# Patient Record
Sex: Female | Born: 1980 | Race: Black or African American | Hispanic: No | Marital: Single | Smoking: Current every day smoker
Health system: Southern US, Community
[De-identification: ages and names within clinical notes are randomized; demographics above are authoritative.]

## PROBLEM LIST (undated history)

## (undated) DIAGNOSIS — F1910 Other psychoactive substance abuse, uncomplicated: Secondary | ICD-10-CM

## (undated) DIAGNOSIS — F1920 Other psychoactive substance dependence, uncomplicated: Secondary | ICD-10-CM

## (undated) DIAGNOSIS — F32A Depression, unspecified: Secondary | ICD-10-CM

## (undated) DIAGNOSIS — F419 Anxiety disorder, unspecified: Secondary | ICD-10-CM

## (undated) DIAGNOSIS — F329 Major depressive disorder, single episode, unspecified: Secondary | ICD-10-CM

## (undated) DIAGNOSIS — F4310 Post-traumatic stress disorder, unspecified: Secondary | ICD-10-CM

## (undated) DIAGNOSIS — T7422XA Child sexual abuse, confirmed, initial encounter: Secondary | ICD-10-CM

## (undated) DIAGNOSIS — G4700 Insomnia, unspecified: Secondary | ICD-10-CM

## (undated) DIAGNOSIS — D259 Leiomyoma of uterus, unspecified: Secondary | ICD-10-CM

## (undated) DIAGNOSIS — T50902A Poisoning by unspecified drugs, medicaments and biological substances, intentional self-harm, initial encounter: Secondary | ICD-10-CM

## (undated) DIAGNOSIS — R45851 Suicidal ideations: Secondary | ICD-10-CM

## (undated) HISTORY — DX: Anxiety disorder, unspecified: F41.9

## (undated) HISTORY — PX: NO PAST SURGERIES: SHX2092

## (undated) HISTORY — DX: Depression, unspecified: F32.A

## (undated) HISTORY — DX: Other psychoactive substance abuse, uncomplicated: F19.10

## (undated) HISTORY — DX: Post-traumatic stress disorder, unspecified: F43.10

## (undated) HISTORY — DX: Leiomyoma of uterus, unspecified: D25.9

## (undated) HISTORY — DX: Major depressive disorder, single episode, unspecified: F32.9

---

## 2008-08-09 ENCOUNTER — Emergency Department (HOSPITAL_COMMUNITY): Admission: EM | Admit: 2008-08-09 | Discharge: 2008-08-09 | Payer: Self-pay | Admitting: Emergency Medicine

## 2012-06-05 ENCOUNTER — Encounter: Payer: Self-pay | Admitting: Internal Medicine

## 2012-09-22 ENCOUNTER — Telehealth: Payer: Self-pay | Admitting: Obstetrics and Gynecology

## 2012-10-01 ENCOUNTER — Encounter: Payer: Self-pay | Admitting: Obstetrics and Gynecology

## 2014-03-17 ENCOUNTER — Other Ambulatory Visit (HOSPITAL_COMMUNITY): Payer: Self-pay | Admitting: Obstetrics and Gynecology

## 2014-03-18 ENCOUNTER — Other Ambulatory Visit (HOSPITAL_COMMUNITY): Payer: Self-pay | Admitting: Obstetrics and Gynecology

## 2014-03-18 DIAGNOSIS — N979 Female infertility, unspecified: Secondary | ICD-10-CM

## 2014-03-23 ENCOUNTER — Ambulatory Visit (HOSPITAL_COMMUNITY)
Admission: RE | Admit: 2014-03-23 | Discharge: 2014-03-23 | Disposition: A | Discharge status: Home / Self Care | Payer: 59 | Source: Ambulatory Visit | Attending: Obstetrics and Gynecology | Admitting: Obstetrics and Gynecology

## 2014-03-23 DIAGNOSIS — N979 Female infertility, unspecified: Secondary | ICD-10-CM | POA: Insufficient documentation

## 2014-03-23 MED ORDER — IOHEXOL 300 MG/ML  SOLN
20.0000 mL | Freq: Once | INTRAMUSCULAR | Status: AC | PRN
  Administered 2014-03-23: 5 mL

## 2014-12-16 ENCOUNTER — Encounter (HOSPITAL_COMMUNITY): Payer: Self-pay | Admitting: Psychology

## 2014-12-17 ENCOUNTER — Other Ambulatory Visit (HOSPITAL_COMMUNITY): Payer: 59 | Admitting: Psychology

## 2014-12-17 ENCOUNTER — Encounter (HOSPITAL_COMMUNITY): Payer: Self-pay | Admitting: Medical

## 2014-12-17 DIAGNOSIS — F1221 Cannabis dependence, in remission: Secondary | ICD-10-CM | POA: Insufficient documentation

## 2014-12-17 DIAGNOSIS — F17200 Nicotine dependence, unspecified, uncomplicated: Secondary | ICD-10-CM | POA: Insufficient documentation

## 2014-12-17 DIAGNOSIS — F1220 Cannabis dependence, uncomplicated: Secondary | ICD-10-CM | POA: Insufficient documentation

## 2014-12-17 DIAGNOSIS — Z62810 Personal history of physical and sexual abuse in childhood: Secondary | ICD-10-CM | POA: Insufficient documentation

## 2014-12-17 DIAGNOSIS — Z811 Family history of alcohol abuse and dependence: Secondary | ICD-10-CM | POA: Insufficient documentation

## 2014-12-17 DIAGNOSIS — F4310 Post-traumatic stress disorder, unspecified: Secondary | ICD-10-CM | POA: Insufficient documentation

## 2014-12-17 DIAGNOSIS — F1620 Hallucinogen dependence, uncomplicated: Secondary | ICD-10-CM | POA: Insufficient documentation

## 2014-12-17 DIAGNOSIS — F32A Depression, unspecified: Secondary | ICD-10-CM | POA: Insufficient documentation

## 2014-12-17 DIAGNOSIS — T7422XA Child sexual abuse, confirmed, initial encounter: Secondary | ICD-10-CM | POA: Insufficient documentation

## 2014-12-17 DIAGNOSIS — F1020 Alcohol dependence, uncomplicated: Secondary | ICD-10-CM | POA: Insufficient documentation

## 2014-12-17 DIAGNOSIS — F17210 Nicotine dependence, cigarettes, uncomplicated: Secondary | ICD-10-CM | POA: Insufficient documentation

## 2014-12-17 DIAGNOSIS — F329 Major depressive disorder, single episode, unspecified: Secondary | ICD-10-CM | POA: Insufficient documentation

## 2014-12-17 DIAGNOSIS — F4312 Post-traumatic stress disorder, chronic: Secondary | ICD-10-CM | POA: Insufficient documentation

## 2014-12-17 MED ORDER — TRAZODONE HCL 50 MG PO TABS: 50.0000 mg | ORAL_TABLET | Freq: Every evening | ORAL | Status: DC | PRN

## 2014-12-17 MED ORDER — HYDROXYZINE PAMOATE 25 MG PO CAPS: 25.0000 mg | ORAL_CAPSULE | Freq: Three times a day (TID) | ORAL | Status: DC | PRN

## 2014-12-17 MED ORDER — ESCITALOPRAM OXALATE 20 MG PO TABS: 20.0000 mg | ORAL_TABLET | Freq: Every day | ORAL | Status: DC
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
## 2014-12-18 ENCOUNTER — Encounter (HOSPITAL_COMMUNITY): Payer: Self-pay | Admitting: Medical
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
## 2014-12-20 ENCOUNTER — Other Ambulatory Visit (HOSPITAL_COMMUNITY): Payer: 59 | Attending: Psychiatry | Admitting: Licensed Clinical Social Worker

## 2014-12-20 ENCOUNTER — Encounter (HOSPITAL_COMMUNITY): Payer: Self-pay | Admitting: Psychology

## 2014-12-20 DIAGNOSIS — F4310 Post-traumatic stress disorder, unspecified: Secondary | ICD-10-CM | POA: Diagnosis not present

## 2014-12-20 DIAGNOSIS — F1020 Alcohol dependence, uncomplicated: Secondary | ICD-10-CM | POA: Diagnosis present

## 2014-12-20 DIAGNOSIS — F17210 Nicotine dependence, cigarettes, uncomplicated: Secondary | ICD-10-CM | POA: Diagnosis not present

## 2014-12-20 DIAGNOSIS — F1220 Cannabis dependence, uncomplicated: Secondary | ICD-10-CM | POA: Diagnosis not present

## 2014-12-20 DIAGNOSIS — Z62810 Personal history of physical and sexual abuse in childhood: Secondary | ICD-10-CM | POA: Diagnosis not present

## 2014-12-20 NOTE — Addendum Note (Signed)
Addended byAmalia Hailey, Damico Partin on: 12/20/2014 09:58 AM   Modules accepted: Medications
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
## 2014-12-21 ENCOUNTER — Encounter (HOSPITAL_COMMUNITY): Payer: Self-pay | Admitting: Psychology

## 2014-12-21 ENCOUNTER — Encounter (HOSPITAL_COMMUNITY): Payer: Self-pay | Admitting: Licensed Clinical Social Worker

## 2014-12-21 NOTE — Progress Notes (Signed)
    Daily Group Progress Note  Program: CD-IOP   Group Time: 1-2:30  Participation Level: Active  Behavioral Response: Appropriate and Sharing  Type of Therapy: Process Group  Topic: After checking in, group members took turns sharing about recovery-related activities and challenges they faced since the last group on Friday.  Group members were active during the discussion and many group members engaged in giving feedback to other group members. Two new group members joined the group today and introduced themselves to the group. Drug tests were taken today.     Group Time: 2:45-4  Participation Level: Active  Behavioral Response: Appropriate and Sharing  Type of Therapy: Psycho-education Group  Topic: The second part of group focused on Part 2 Phases of Recovery. A continuation of Friday's group entailed each group member identifying issues in recovery they currently deal with or have in the past. Group members were engaged in discussion of the Ambivalence Phase of Recovery.     Summary: She participated openly in her first group. Her sobriety date is 4/24. [place]:  This is the patient's second group. She reported the weekend was difficult for her. She didn't sleep well and wondered whether the Trazadone was working. Other group members gave her information about their experience with Trazadone. The patient did not go to any meetings and didn't call anyone. The patient allowed her 2 nephews to come over and spend Friday night with her, even though she was tired. It was pointed out to her that she needs to work on self-care. The patient acknowledged that she understands self-care but just doesn't do it. 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 The patient reported isolation is a trigger for her to drink. Several group members gave her feedback about the importance of going to meetings and calling people in recovery. The patient participated in the  discussion about ambivalence. She acknowledges that she wants to stop using alcohol but doubts her personal strength to accomplish this. The patient feels shame and guilt about how alcohol has affected her life. The patient was open to suggestions and participated in the discussion on the phases of recovery. Her sobriety date remains 4/22.   Family Program: Family present? No   Name of family member(s):   UDS collected: Yes Results: Pending  AA/NA attended?: No{ Sponsor?: No   Lorry Anastasi S, Licensed Cli

## 2014-12-22 ENCOUNTER — Other Ambulatory Visit (HOSPITAL_COMMUNITY): Payer: 59 | Admitting: Psychology
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
## 2014-12-23 ENCOUNTER — Encounter (HOSPITAL_COMMUNITY): Payer: Self-pay | Admitting: Psychology

## 2014-12-23 NOTE — Progress Notes (Signed)
    Daily Group Progress Note  Program: CD-IOP   Group Time: 1-2:30 pm  Participation Level: Active  Behavioral Response: Appropriate and Sharing  Type of Therapy: Process Group  Topic: After checking in, group members took turns sharing about any recovery related activities they had engaged in since the last group meeting, as well as any challenges they faced.  Anxiety reduction strategies were also discussed and group members provided each other with feedback throughout the session. Drug tests results were returned today and 2 were collected from the newest members.  Group Time: 2:45- 4pm  Participation Level: Active  Behavioral Response: Appropriate  Type of Therapy: Psycho-education Group  Topic: "Self-Care"; the second part of group was spent in a psycho-ed on Self Care.  A PowerPoint presentation was provided followed up by handouts. The topics included diet and sleep. Group members learned about the importance of good nutrition and shared about some of their own habits and patterns, many of which were unhealthy and discouraged. They also discussed strategies for eating in more healthful and consistent ways. Exercise and sleep hygiene strategies were also discussed.  Summary: The patient stated that she walked her dog on Monday and attended her first [place] meeting on Tuesday morning. She had planned on meeting a member of this program at the meeting and it really helped her knowing someone there.  She reported that she felt very welcomed.  The patient reported she also met with her pastor yesterday and they are going to begin meeting on a weekly basis.  She had a session with her counselor on Tuesday as well.   The patient stated that she has been feeling very "foggy" and forgetful and is having a lot of anxiety.  She told the group that she needs to have some time to herself and be as honest with herself as she is being with everyone else.  I encouraged her to go slowly and be as kind  and gentle to herself and she would be to close friend who was struggling with these same issues. The patient is sleeping better and discussed a conflict that she is having with her sister.  She also stated that she has not been eating and has "no appetite".  She received good feedback and agreed that with the information obtained in the slide show, she will address her eating and making herself have a little something even if she doesn't feel hungry. The patient is doing well in her very early recovery and responded well to this intervention. Her sobriety date remains 4/22.   Family Program: Family present? No   Name of family member(s):   UDS collected: No Results:  AA/NA attended?: [place]  Sponsor?: No, but she is very new to recovery   Makella Buckingham, LCAS

## 2014-12-24 ENCOUNTER — Other Ambulatory Visit (HOSPITAL_COMMUNITY): Payer: 59 | Admitting: Psychology

## 2014-12-24 DIAGNOSIS — F1020 Alcohol dependence, uncomplicated: Secondary | ICD-10-CM | POA: Diagnosis not present

## 2014-12-27 ENCOUNTER — Encounter (HOSPITAL_COMMUNITY): Payer: Self-pay | Admitting: Psychology

## 2014-12-27 ENCOUNTER — Other Ambulatory Visit (HOSPITAL_COMMUNITY): Payer: 59 | Attending: Psychiatry | Admitting: Licensed Clinical Social Worker

## 2014-12-27 DIAGNOSIS — F4310 Post-traumatic stress disorder, unspecified: Secondary | ICD-10-CM | POA: Diagnosis not present

## 2014-12-27 DIAGNOSIS — F1221 Cannabis dependence, in remission: Secondary | ICD-10-CM | POA: Diagnosis not present

## 2014-12-27 DIAGNOSIS — F1020 Alcohol dependence, uncomplicated: Secondary | ICD-10-CM | POA: Diagnosis present

## 2014-12-27 NOTE — Progress Notes (Signed)
    Daily Group Progress Note  Program: CD-IOP   Group Time: 1-2:30 pm  Participation Level: Active  Behavioral Response: Appropriate and Sharing  Type of Therapy: Process Group  Topic: Process; the first half of group was spent in process. Members shared about the recovery-related activities they had engaged in since the last group session. Also discussed where any challenges or issues that may have threatened their recovery. During group today, the Market researcher met with group members, including 3 for their initial assessment, and a member who was requesting a med check. Two drug tests, both negative, were returned to 2 new group members.   Group Time: 2:45- 4pm  Participation Level: Active  Behavioral Response: Appropriate and Sharing  Type of Therapy: Psycho-education Group  Topic: "Families: Nurturing vs Dysfunctional". The second half of group was spent in a psycho-ed. A PowerPoint slide show was shown and members were provided with a handout listing the characteristics of a nurturing family as compared to a dysfunctional one. As the group read over the descriptions, members shared about their own families and any characteristics they could identify. The discussion was emotional and painful for some members. Most of those present today grew up in homes with addiction and/or dysfunctional family members. As requested, members had brought in pictures of themselves when they were children and everyone shared in viewing them. Near the conclusion of the session, a graduation ceremony was held for the Intern who had recently completed her studies and would be leaving the program after working with the program for the past 9 months.   Summary: The patient reported she had attended 2 AA meetings since the last group session.  She attended the Bible Study/Prayer meeting at her church on Wednesday evening and that was very validating. The patient admitted that she "feels scared" about being  in early recovery and the uneasiness of this place where she is. Other members validated her feelings and agreed that they had also felt scared. At times, they still do. She had slept much better last night and she is becoming more comfortable with the Trazadone and not experiencing that 'hung-over' feeling in the morning as she had when she began taking it. In the psycho-ed, the patient reported she felt as if her biological family displayed many of both of the characteristics listed under nurturing and dysfunctional. She wondered whether she had misperceptions of what her childhood had been like? The patient made some good comments and displays good insight as she progresses in her very early recovery. She responded well to this intervention and her sobriety date remains 4/22.   Family Program: Family present? No   Name of family member(s):   UDS collected: No Results:   AA/NA attended?: YesThursday and Friday  Sponsor?: No, but she is new to the program and recovery   Marcin Holte, LCAS

## 2014-12-29 ENCOUNTER — Other Ambulatory Visit (HOSPITAL_COMMUNITY): Payer: 59 | Admitting: Psychology

## 2014-12-29 ENCOUNTER — Encounter (HOSPITAL_COMMUNITY): Payer: Self-pay | Admitting: Licensed Clinical Social Worker
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
## 2014-12-30 ENCOUNTER — Encounter (HOSPITAL_COMMUNITY): Payer: Self-pay | Admitting: Psychology

## 2014-12-30 NOTE — Progress Notes (Signed)
    Daily Group Progress Note  Program: CD-IOP   Group Time: 1-2:30 pm  Participation Level: Active  Behavioral Response: Appropriate  Type of Therapy: Process Group  Topic: Process: The first part of group was spent in process. Group members shared about what they have done since our last group session to support their recovery. They also identified any triggers or issues that may have contributed to cravings. There was good feedback and discussion among members. Drug test results collected on Monday were returned to those members.   Group Time: 2:45- 4pm  Participation Level: Active  Behavioral Response: Appropriate and Sharing  Type of Therapy: Psycho-education Group  Topic: Psycho-Ed: Family Sculpture: the second half of group was spent in an exercise called "Family Sculpture". Group members were asked to 'sculpt' their families using their fellow group members to serve as their family members. They were asked to sculpt their families during some time when they were children. This proved to be a very emotional session and included disclosures about member's lives and childhoods that had not been revealed before. At the conclusion of this session, the facilitators checked in with group members to insure that everyone left feeling in control and stable.   Summary: The patient reported she had gone to the 8 am [place] meeting on 2 occasions since the last group. She was trying to find a meeting that would better fit the new routine she is devising. She also met with her pastor for their weekly session. The patient served as a family member in at least 2 of the sculptures done by her fellow group members. She provided good feedback about how it felt to play the person she was representing. She did not do her family sculpture today, but will sculpt her own family on Friday. The patient responded well to this intervention and is making good progress in her early recovery. Her sobriety date  remains 4/22.   Family Program: Family present? No   Name of family member(s):   UDS collected: No Results:   AA/NA attended?: YesTuesday, Wednesday  Sponsor?: No   Ayansh Feutz, LCAS

## 2014-12-31 ENCOUNTER — Other Ambulatory Visit (HOSPITAL_COMMUNITY): Payer: 59 | Admitting: Licensed Clinical Social Worker

## 2014-12-31 DIAGNOSIS — F1020 Alcohol dependence, uncomplicated: Secondary | ICD-10-CM | POA: Diagnosis not present

## 2014-12-31 DIAGNOSIS — F1620 Hallucinogen dependence, uncomplicated: Secondary | ICD-10-CM

## 2015-01-03 ENCOUNTER — Encounter (HOSPITAL_COMMUNITY): Payer: Self-pay | Admitting: Licensed Clinical Social Worker

## 2015-01-03 ENCOUNTER — Other Ambulatory Visit (HOSPITAL_COMMUNITY): Payer: 59

## 2015-01-03 NOTE — Progress Notes (Signed)
    Daily Group Progress Note  Program: CD-IOP   Group Time: 1-2:30  Participation Level: Active  Behavioral Response: Appropriate and Sharing  Type of Therapy: Process Group  Topic: After checking in, group members took turns sharing about recovery-related activities and challenges they faced since the last group on Wednesday. One group member reported she relapsed. The relapse process was mapped out on the whiteboard. Group members were openly engaged in suggestions and feedback to the group member. Three group members saw PA Harold Hedge today. One group member was absent today. The second part of group was a continuation of challenges they faced since the last group. Two group members also had "a burning desire" that they wanted to discuss. One group member reported she is sad, depressed, and angry. Another group member reported she is agitated, tired and in physical pain. She reported she wants to drink so she can sleep. Several group members gave suggestions and strategies for relapse and a discussion ensued on the effects of PAWS.  Group members showed support to both group members. All group members appeared open and honest in sharing feelings during the process.      Group Time: 2:45-4  Participation Level: Active  Behavioral Response: Appropriate and Sharing  Type of Therapy: Process Group  Topic: The second part of group was a continuation of challenges they faced since the last group. Two group members also had "a burning desire" that they wanted to discuss. One group member reported she is sad, depressed, and angry. Another group member reported she is agitated, tired and in physical pain. She reported she wants to drink so she can sleep. Several group members gave suggestions and strategies for relapse and a discussion ensued on the effects of PAWS.  Group members showed support to both group members. All group members appeared open and honest in sharing feelings during the  process.      Summary: The patient happily reported her mom has come to town. She is excited about her mom being in town especially around Mother's Day. The patient reported she went to Bible study Wednesday night and meetings Thursday and Friday mornings. As group progressed patient became tearful and reported she is sad, depressed and angry. She also reported she is not eating, is never hungry and forces herself to eat one meal a day. Many group members showed empathy and support to patient. PA Harold Hedge suggested to her that she start drinking Ensures and she gladly accepted that suggestion. The patient continued to quietly cry and reports she is not good at asking for help. Group members gave her suggestions to let her know that she cannot do this alone and must learn how to ask for help. Patient readily accepted all suggestions and feedback and actually was smiling before group ended. Her sobriety date remains 4/22.   Family Program: Family present? No   Name of family member(s):   UDS collected: No Results: Pending  AA/NA attended?: YesThursday and Friday  Sponsor?: No   MACKENZIE,LISBETH S, Licensed Cli

## 2015-01-04 ENCOUNTER — Encounter (HOSPITAL_COMMUNITY): Payer: Self-pay | Admitting: Psychology

## 2015-01-04 ENCOUNTER — Telehealth (HOSPITAL_COMMUNITY): Payer: Self-pay | Admitting: Psychology

## 2015-01-04 NOTE — Progress Notes (Signed)
Amber Craig is a 34 y.o. female patient. CD-IOP: Family Session. I met with the patient and her mother this morning as scheduled. The patient and I had scheduled this session last week after travel plans for her mother were finalized. She had come in from [place] on Friday and intended to fly out early this coming Saturday morning. I welcomed the patient and her mother and thanked her for coming in and meeting with [place]. The basic concepts behind the program were provided and the importance of other skills, including communication, forgiveness and self-care were discussed at length. When asked how she was feeling, the patient reported she feels flat or "disconnected" from herself. She admitted she has not followed through on her 'schedule' for the week days. We have talked at length about her establishing and following a daily routine from Monday through Fridays. Instead of walking, attending meetings, journaling, reading from her [place] and other recovery-related information, she admitted sitting on the couch or just sleeping. The patient suggested that perhaps she has been going through the motions and putting on a good face for so much of her life, she doesn't really know what she is feeling deep down inside. We all agreed that being as open as she can be in the group and individual sessions will be essential. She wondered if she could meet with the program director tomorrow. I explained he was not available until Friday, but I could certainly speak with him during our treatment team tomorrow morning. The patient began taking the Lexapro about 2 weeks ago so any changes are still not going to be apparent yet. The patient's mother was very supportive and validating and she agreed that developing and following a daily schedule would be very helpful. The importance of good self-care, something this young woman has really not done in her life, was discussed and she and her mother agreed to talk about what things  they can do that "feed their soul".  The session ended with the patient's mother agreeing to contact me if she has any concerns or thoughts about her daughter, while the patient agreed that she would return to group tomorrow at 1 pm. The patient has attended 7 group sessions and is attending the minimal number of 12-step meetings required. She is taking her medications, but has seen little benefit from the anti-depressant. The trazadone has proven helpful for sleep.  The session was effective in making it clear what is expected of this patient and what will most likely help her attain stability and more joy in her daily life.  Whether she intends to truly do these things remains to be seen. We will continue to follow closely in the days ahead. The patient's sobriety date remains 12/17/14.         Edric Fetterman, LCAS

## 2015-01-05 ENCOUNTER — Other Ambulatory Visit (HOSPITAL_COMMUNITY): Payer: 59 | Admitting: Psychology

## 2015-01-05 DIAGNOSIS — F1221 Cannabis dependence, in remission: Secondary | ICD-10-CM

## 2015-01-05 DIAGNOSIS — F1020 Alcohol dependence, uncomplicated: Secondary | ICD-10-CM

## 2015-01-05 DIAGNOSIS — F1620 Hallucinogen dependence, uncomplicated: Secondary | ICD-10-CM

## 2015-01-05 DIAGNOSIS — F4312 Post-traumatic stress disorder, chronic: Secondary | ICD-10-CM

## 2015-01-07 ENCOUNTER — Other Ambulatory Visit (HOSPITAL_COMMUNITY): Payer: 59 | Admitting: Psychology

## 2015-01-07 ENCOUNTER — Encounter (HOSPITAL_COMMUNITY): Payer: Self-pay | Admitting: Psychology

## 2015-01-07 NOTE — Progress Notes (Signed)
    Daily Group Progress Note  Program: CD-IOP   Group Time: 1-2:30 pm  Participation Level: Active  Behavioral Response: Appropriate and Sharing  Type of Therapy: Psycho-education Group  Topic: Psycho-Ed: the first part of group was spent in a psycho-ed with a visiting chaplain. He invited group members to share their own definitions of Spirituality. While some members easily identified their description, others struggled and were confused about spirituality versus religion. The session ended with members able to differentiate the meaning of spirituality and religion. Group members reported they had enjoyed the session and the manner of the guest. One drug test was collected during group today while the results from [facility] session were returned to others.   Group Time: 2:45- 4pm  Participation Level: Active  Behavioral Response: Sharing  Type of Therapy: Process Group  Topic: Process/Graduation: the second half of group was spent in process. Members shared about the recovery-related things they had done since the last group session. While some reported attending a number of 12-step meetings, others admitted that they had not been to any. The rules of the program, including attending at least 4 12-step meetings per week was emphasized. The session ended with a graduation ceremony. The member graduating hade invited his parents and girlfriend. Also invited were 2 former group members who had previously graduated. Kind words were shared and the session proved very touching to all present.     Summary: The patient was attentive and engaged during the visit with the chaplain. She offered her experience and belief and noted that it is comforting "knowing that there is a Higher Power". the patient agreed with the chaplain when he wondered whether this meant that she was "relinquishing control". in process, the patient reported she had spent time with her mother and had a session with her  counselor and mother. It had gone well and they were really talking about things. She also noted that she is eating more than she was previously. The patient attended an [place] meeting this morning at the [employer] and confirmed she has a Medical sales representative. She reported, "I am feeling better today". The patient shared about some upcoming plans to attend a "Burlesque Show" and wondered whether she should go or not? There will be lots of alcohol and she was questioning whether this would be too risky for her. Group members provided good feedback to her and she accepted the feedback well. She shared kind words with the graduating member and wished him well. The patient presented today as more stable and emotionally balanced. She made some good comments and responded well to this intervention. Her sobriety date remains 4/22.  Family Program: Family present? No   Name of family member(s):   UDS collected: No Results:  AA/NA attended?: YesWednesday  Sponsor?: Yes, she has has a temporary sponsor   Amber Craig, LCAS

## 2015-01-10 ENCOUNTER — Encounter (HOSPITAL_COMMUNITY): Payer: Self-pay | Admitting: Psychology

## 2015-01-10 ENCOUNTER — Other Ambulatory Visit (HOSPITAL_COMMUNITY): Payer: 59 | Admitting: Licensed Clinical Social Worker

## 2015-01-10 DIAGNOSIS — F1020 Alcohol dependence, uncomplicated: Secondary | ICD-10-CM

## 2015-01-10 NOTE — Progress Notes (Signed)
    Daily Group Progress Note  Program: CD-IOP   Group Time: 1-2:30 pm  Participation Level: Active  Behavioral Response: Appropriate and Sharing  Type of Therapy: Process Group  Topic: Process: the first part of group was spent in process. Members shared about things they had done since the last session that supported their recovery. Any obstacles, temptations or thoughts about using were also disclosed. During group today, the Investment banker, operational met with the new member for a psychiatric assessment.   Group Time: 2:45-4pm  Participation Level: Active  Behavioral Response: Sharing  Type of Therapy: Psycho-education Group  Topic: Family Sculpture/Pictures: the second half of group was spent in a psycho-ed. Those members who had not done so previously, 'Sculpted" their families. They explained the different roles and atmosphere during the sculptures and received good feedback and comments by their fellow group members. Members had brought pictures from their childhoods and group members walked around the room looking at the pictures. Members shared their memories around these pictures and the events that were occurring around them. This represented another opportunity to understand and get to know one another even better.   Summary: The patient reported she had attended 2 AA meetings since the last group session. She had met another group member at one of the meetings. She intended to go to the 5:30 Happy Hour this afternoon at the [employer] and then head to her Bible Study at 7 pm at her church. The patient shared that she had decided to go to the [place] she had talked about on Wednesday. The show was really good and although there was a lot of alcohol around, it didn't really bother her and she enjoyed the show. The patient sculpted her family and her sculpture was sad and painful and she recounted the fear that he mother and 3 siblings shared over her father. "When he went to  work", the patient explained, "everybody relaxed and played, but when he was going to be in the house, we were all terrified of what might set him off". The patient had brought a number of pictures and at least 2 of them included her family. She talked openly about the ages of herself and siblings and what was going on at the times the pictures were taken. The patient made some good comments and responded well to this intervention. Her sobriety date remains 4/22.    Family Program: Family present? No   Name of family member(s):   UDS collected: No Results:  AA/NA attended?: YesThursday and Friday  Sponsor?: Yes, a temporary sponsor   Densil Ottey, LCAS

## 2015-01-11 ENCOUNTER — Encounter (HOSPITAL_COMMUNITY): Payer: Self-pay | Admitting: Licensed Clinical Social Worker

## 2015-01-11 NOTE — Progress Notes (Signed)
Amber Craig is a 34 y.o. female patient. CD-IOP: Couples Counseling Session. I met with the patient and her S/O this afternoon at the conclusion of her CD-IOP group session. After the introductions, I briefly explained the type of treatment that the patient is receiving and some of her goals of this treatment. I invited her S/O, Amber Craig, to provide some of his observations and insights into the struggles that Amber Craig is facing. He was very open and forthcoming about his S/O and displayed good insight into some of her issues. He reminded me that they have been together for over 8 years and they know each other very well. The patient and her S/O agreed that she lets a bad event over the course of the day determine her mood for the rest of the day. While a nice or pleasant event or interaction is enjoyed, it does not hold her mood like a bad event. He also agreed that she is passive-aggressive. Amber Craig agreed with this and we discussed how she might begin to become more assertive and honest in her daily communication with Amber Craig. While he is more open about communicating his needs, Amber Craig is clearly in need of feeling confident and able to clearly articulate what she needs and how she is feeling. She had the "Feeling Wheel" handout and reported she is using those daily because she isn't sure what she is feeling. I also provided them both with the handout, "[place]". They were both encouraged to read this handout by themselves and then in the next few days, they can sit down together and discuss what they read and if they agree with the recommendations. If so, they can begin to incorporate some of the recommendations in their daily lives. Amber Craig brought up the issue of intimacy. She explained that sometimes, she doesn't want to be sexual, but feels guilty if she refuses to engage intimately and goes against her inner feelings to satisfy him. Amber Craig and I both agreed that this is her 'stuff' and they both  agreed that he makes no attempt to put her on a guilty trip. I reminded her that her body is sacred and it is hers. She is not to share or allow any thing that she doesn't w3ant to do and should be proud and feel strongly about honoring herself/her body. The patent noted that historically, with men in the past, she has not wanted to be sexual, but engaged to satisfy them. But during the actual intercourse, she was disembodied and not connected with what was going on.  We discussed how they might go about deciding whether they would be sexual and on some nights, they will agree that they will not be sexual. The couple agreed to some of these recommendations and will begin to practice them. We agreed that we would meet again within about 2 weeks and discuss any issues or progress that has occurred. I also emphasized the importance of routine and structure and encouraged Amber Craig to fill out her weekly schedule from M-F on Sunday evening. She agreed to become more engaged and structured in the week days. The session went well and Amber Craig and Amber Craig talked openly about their relationship and ways they can work on improving their communication. We will continue to follow closely in the days ahead. The patient's sobriety date remains 4/22.        Amber Craig, LCAS

## 2015-01-11 NOTE — Progress Notes (Signed)
    Daily Group Progress Note  Program: CD-IOP   Group Time: 1-2:30  Participation Level: Active  Behavioral Response: Appropriate and Sharing  Type of Therapy: Process Group  Topic: After checking in, group members took turns sharing about recovery-related activities and challenges they faced since the last group on Friday. They also shared any urges or cravings they may have experienced the weekend.  Group members were openly engaged in suggestions and feedback to a group member who was returning to the group after a relapse. Two group members were absent. Drug tests were given out and two negative drug tests were returned.  T    Group Time: 2:45-4  Participation Level: Active  Behavioral Response: Appropriate and Sharing  Type of Therapy: Psycho-education Group  Topic: The second part of group focused on cognitive distortions. A staff member from the [facility] facilitated sessions 2 of the 8 week program titled "Down the Rabbit Hole," focusing on cognitive distortions. The facilitator uses multi-media: comic strip and lyrics from a song to show examples of distortions.  Each week 2 cognitive distortions will be discussed along with coping tools to use to change thinking patterns. This week the 2 cognitive distortions discussed were: personalization and blaming. Group members were actively engaged in identifying rational thoughts that can be used to combat the distorted thought and determined how the distortion may have manifested in their own lives.     Summary:  Patient reported her mom left Saturday. It was good having her to visit and is sad she has left, but she is used to her mother not living here. Patient went to a meeting Saturday morning and then came home and napped. Sunday she went to church and spent the rest of the day with her family. She gave positive feedback to returning patient by saying: Where has our way gotten [place], maybe we should try something  else." Patient reported her cognitive distortion for blaming. She blamed her boyfriend for her use of drugs, when it was actually her that motivated him to use with her. Patient appeared open and honest in her discussion on cognitive distortions. Her sobriety date remains 4/22.   Family Program: Family present? No   Name of family member(s):   UDS collected: Yes Results: Pending  AA/NA attended?: YesMonday  Sponsor?: No   Wilhelmena Zea S, Licensed Cli

## 2015-01-12 ENCOUNTER — Encounter: Payer: Self-pay | Admitting: Psychiatry

## 2015-01-12 ENCOUNTER — Other Ambulatory Visit (HOSPITAL_COMMUNITY): Payer: 59 | Admitting: Psychology

## 2015-01-12 DIAGNOSIS — F1620 Hallucinogen dependence, uncomplicated: Secondary | ICD-10-CM

## 2015-01-12 DIAGNOSIS — F329 Major depressive disorder, single episode, unspecified: Secondary | ICD-10-CM

## 2015-01-12 DIAGNOSIS — F32A Depression, unspecified: Secondary | ICD-10-CM

## 2015-01-12 DIAGNOSIS — F1020 Alcohol dependence, uncomplicated: Secondary | ICD-10-CM | POA: Diagnosis not present

## 2015-01-12 DIAGNOSIS — F4312 Post-traumatic stress disorder, chronic: Secondary | ICD-10-CM

## 2015-01-14 ENCOUNTER — Encounter (HOSPITAL_COMMUNITY): Payer: Self-pay | Admitting: Medical

## 2015-01-14 ENCOUNTER — Other Ambulatory Visit (HOSPITAL_COMMUNITY): Payer: 59 | Admitting: Psychology

## 2015-01-14 ENCOUNTER — Other Ambulatory Visit (HOSPITAL_COMMUNITY): Payer: Self-pay | Admitting: Medical

## 2015-01-14 DIAGNOSIS — F10239 Alcohol dependence with withdrawal, unspecified: Secondary | ICD-10-CM

## 2015-01-14 DIAGNOSIS — F449 Dissociative and conversion disorder, unspecified: Secondary | ICD-10-CM | POA: Insufficient documentation

## 2015-01-14 MED ORDER — BUSPIRONE HCL 15 MG PO TABS: 15.0000 mg | ORAL_TABLET | Freq: Three times a day (TID) | ORAL | Status: DC

## 2015-01-17 ENCOUNTER — Other Ambulatory Visit (HOSPITAL_COMMUNITY): Payer: 59 | Admitting: Licensed Clinical Social Worker

## 2015-01-17 ENCOUNTER — Encounter (HOSPITAL_COMMUNITY): Payer: Self-pay | Admitting: Psychology

## 2015-01-17 DIAGNOSIS — F1020 Alcohol dependence, uncomplicated: Secondary | ICD-10-CM

## 2015-01-17 DIAGNOSIS — F1620 Hallucinogen dependence, uncomplicated: Secondary | ICD-10-CM

## 2015-01-17 NOTE — Psych (Signed)
    Daily Group Progress Note  Program: CD-IOP   Group Time: 1-2:30 pm  Participation Level: Active  Behavioral Response: Appropriate  Type of Therapy: Process Group  Topic: Group Process.  The first half of group was spent in process. Members shared about any problems or challenges to their sobriety. Members also identified what they had done since the last group to strengthen their recovery, including attending meetings, interacting with others in recovery, and self-care, such as exercise, diet or self-reflection. During the session today, the program director met with a number of group members.  Group Time: 2:45- 4pm  Participation Level: Active  Behavioral Response: Appropriate  Type of Therapy: Psycho-education Group  Topic: Forgiveness/Graduation Ceremony: the second half of group was spent in a psycho-ed on "Forgiveness". Members were provided with handouts and the session moved from resentments, discussed in the last session, to forgiveness. There were good questions about forgiveness, including the time frame and what happens if people aren't ready to forgive. A variety of ways to begin to forgive were offered and members were engaged and shared easily with each other about this struggle. As the session neared an end, a graduation ceremony was held to honor a successfully graduating member. There were kind words offered and the session and week ended with positive and validating session.   Summary: The patient reported she had attended 3- AA meetings since the last group session. She had talked to her boyfriend, but he remains somewhat distant despite denying any efforts to push away from her. The patient admitted she has lots of resentments and this realization has opened her eyes. We discussed the way she might avoid resentments going forward, but those that she has now, will need to be addressed. Eventually, forgiveness will be the answer. The patient admitted she had a hard  time forgiving herself, but another member pointed out that she must be able to forgive herself or she really can't forgive someone else. The patient continues to struggle with her past, but is doing quite well regarding compliance in this program. She responded well to this intervention and her sobriety date remains 4/22.    Family Program: Family present? No   Name of family member(s):   UDS collected: No Results:   AA/NA attended?: YesWednesday, Thursday and Friday  Sponsor?: Yes   Alexius Ellington, LCAS

## 2015-01-18 ENCOUNTER — Encounter (HOSPITAL_COMMUNITY): Payer: Self-pay | Admitting: Licensed Clinical Social Worker

## 2015-01-18 NOTE — Psych (Signed)
    Daily Group Progress Note  Program: CD-IOP   Group Time: 1-2:30  Participation Level: Active  Behavioral Response: Appropriate and Sharing  Type of Therapy: Process Group  Topic: After checking in, group members took turns sharing about recovery-related activities and challenges they faced since the last group on Friday. They also shared any urges or cravings they may have experienced over the weekend. There were 3 new members that joined the group today. Drug tests were collected from all present today.     Group Time: 2:45-4  Participation Level: Active  Behavioral Response: Appropriate and Sharing  Type of Therapy: Psycho-education Group  Topic: The second part of group focused on cognitive distortions. A staff member from the [facility] facilitated session # 3 of the 8 week program titled "Down the Rabbit Hole". The facilitator uses multi-media: podcast and segment from a sitcom to show examples of distortions.  Each week 2 cognitive distortions will be discussed along with coping tools to change thinking patterns. This week the 2 cognitive distortions discussed were: fixation on getting your way and being correct. Group members were actively engaged in identifying rational thoughts that can be used to combat the distorted thought and determined how the distortion may have manifested in their own lives. The coping tool discussed for these 2 cognitive distortions were a cost/benefit analysis. Each patient completed the worksheets on cognitive distortions.       Summary: Patient reported she went to her normal "early bird" meeting Saturday morning. She saw a formal group member Saturday and met with her for 4 hours and learned to crochet. She reports she is feeling tired and sleeping more but is feeling better, less depressed.  She is now 30days sober and received a round of applause from the group members. She is going to pick up her 30 day chip tomorrow  morning at the [employer], which she is going to make her home group. Several group members acknowledged they would come to the meeting in the morning to help her celebrate picking up her 30 day chip. The patient participated in the group discussion on cognitive distortions. She completed the worksheets and identified her fallacy of change: "I don't like when my boyfriend disagrees with me. I will pout and get mad even when I know I'm wrong.I just want him to change." Her sobriety date is 4/22.   Family Program: Family present? No   Name of family member(s):   UDS collected: Yes Results: Pending  AA/NA attended?: YesMonday and Saturday  Sponsor?: Yes   MACKENZIE,LISBETH S, Licensed Cli

## 2015-01-19 ENCOUNTER — Other Ambulatory Visit (HOSPITAL_COMMUNITY): Payer: 59 | Admitting: Psychology

## 2015-01-19 ENCOUNTER — Encounter (HOSPITAL_COMMUNITY): Payer: Self-pay | Admitting: Psychology

## 2015-01-19 DIAGNOSIS — F329 Major depressive disorder, single episode, unspecified: Secondary | ICD-10-CM

## 2015-01-19 DIAGNOSIS — F1020 Alcohol dependence, uncomplicated: Secondary | ICD-10-CM

## 2015-01-19 DIAGNOSIS — F32A Depression, unspecified: Secondary | ICD-10-CM

## 2015-01-19 DIAGNOSIS — F4312 Post-traumatic stress disorder, chronic: Secondary | ICD-10-CM

## 2015-01-19 DIAGNOSIS — F1620 Hallucinogen dependence, uncomplicated: Secondary | ICD-10-CM

## 2015-01-19 NOTE — Progress Notes (Signed)
    Daily Group Progress Note  Program: CD-IOP   Group Time: 1-2:45 pm  Participation Level: Active  Behavioral Response: Sharing  Type of Therapy: Process Group  Topic: Process: the first part of group was spent in process. Members shared about current issues and challenges. They also identified what they had done to support their recovery since the last group session. The results of drug tests were handed out. One test was positive for alcohol and the member was asked about this. A discussion about 'total abstinence" ensued and with the exception of the member who tested positive, all other group members were adamant about the importance of total abstinence.  Group Time: 2:45- 4pm  Participation Level: Active  Behavioral Response: Sharing  Type of Therapy: Psycho-education Group  Topic: Resentments: the second half of group was spent in a psycho-ed on Resentments. A handout was provided to group members and they took turns reading it. Included on the handout, was information about how resentments develop (unresolved anger/hurt) and the destructive nature of resentments for folks in early recovery. A good discussion followed with members sharing about their own resentments and how these 'grudges' manifested in their daily lives. The session included members sharing about what they get from holding onto resentments and why they don't more easily let them go. The disclosures were painful, but honest and the session focused on issues many members had not previously addressed or, in some instances, may not have even been aware of before.   Summary: The patient reported she had attended an [place] meeting on Tuesday. She had taken her dog to the [employer] to celebrate and buy her some birthday treats. The patient admitted she had felt very sad yesterday. When asked about this, the patient reported her S/O is acting very distant and less affectionate since our session when we discussed intimacy.   She is disappointed because he has maintained that he is open and supportive, but clearly he being passive-aggressive. In the discussion on resentments, the patient admitted that after reading the handout, she realizes that she is full of resentments. She had no idea. The patient agreed that she has a lot of work to do to address these resentments. She made some good comments and seems to be fully engaged and committed to addressing these dysfunctional patterns she has developed over the years. The patient responded well to this intervention and her sobriety date remains 4/22.   Family Program: Family present? No   Name of family member(s):   UDS collected: No Results:  AA/NA attended?: [place]  Sponsor?: No   Jada Fass, LCAS

## 2015-01-20 ENCOUNTER — Encounter (HOSPITAL_COMMUNITY): Payer: Self-pay | Admitting: Psychology

## 2015-01-20 NOTE — Progress Notes (Signed)
    Daily Group Progress Note  Program: CD-IOP   Group Time: 1-2:30 pm  Participation Level: Active  Behavioral Response: Appropriate and Sharing  Type of Therapy: Process Group  Topic: Process:  The first part of group was spent in process. Members shared about current issues and concerns regarding their recovery. Members included any meetings or sessions with sponsor. A new member was present and she introduced herself. Drug tests were collected from new members and the results of those tests collected on Monday were returned.  Group Time: 2:45- 4pm  Participation Level: Active  Behavioral Response: Appropriate  Type of Therapy: Psycho-education Group  Topic: Psycho-Ed; The Clichs of AA: the second half of group was spent in a psycho-ed. A presentation and ensuing discussion on the meaning of the most well-known AA clichs was provided. Members were asked to identify the meaning of these 5 well-known sayings, including things like "Easy Does It" and "First Things First". While some members understood the meanings behind these sayings, others were not so clear. A lengthy discussion concerning the accuracy and importance of them occurred and there was excellent feedback among group members. The importance of taking things slowly, the priority on sobriety and letting go of control were some of the meanings behind these clichs.   Summary: The patient reported she was doing well. She had attended 2 AA meetings since the last group, including yesterday's meeting at the [employer] at 8 am. She smiled as she described she had picked up her 30 day chip. The group applauded this news. The patient reported that at least 4 other group members had been present there at the session to witness this accomplishment. They were all very pleased as was this patient. She reported she had gone for a mani-pedi to celebrate her 30 days. She is also working hard to follow her routine. At one point, the  patient reported she thought she was having a lot of cravings, but after listening to others, she realized these are very typical and to be expected.  The patient displayed good insight into the meanings behind the "clichs", especially regarding the "First things First". The patient made some good comments and continues working hard in her early recovery and making progress. Her sobriety date remains 4/22.    Family Program: Family present? No   Name of family member(s):   UDS collected: No Results: but Monday's test was negative  AA/NA attended?: [place] and Wednesday  Sponsor?: No, but reported she intends to speak with a woman by tomorrow   Brandon Melnick, LCAS

## 2015-01-21 ENCOUNTER — Other Ambulatory Visit (HOSPITAL_COMMUNITY): Payer: 59 | Admitting: Psychology

## 2015-01-21 DIAGNOSIS — F329 Major depressive disorder, single episode, unspecified: Secondary | ICD-10-CM

## 2015-01-21 DIAGNOSIS — F1020 Alcohol dependence, uncomplicated: Secondary | ICD-10-CM

## 2015-01-21 DIAGNOSIS — F1620 Hallucinogen dependence, uncomplicated: Secondary | ICD-10-CM

## 2015-01-21 DIAGNOSIS — F32A Depression, unspecified: Secondary | ICD-10-CM

## 2015-01-21 DIAGNOSIS — F4312 Post-traumatic stress disorder, chronic: Secondary | ICD-10-CM

## 2015-01-25 ENCOUNTER — Encounter (HOSPITAL_COMMUNITY): Payer: Self-pay | Admitting: Psychology

## 2015-01-25 NOTE — Progress Notes (Signed)
    Daily Group Progress Note  Program: CD-IOP   Group Time: 1-2:30 pm  Participation Level: Active  Behavioral Response: Appropriate  Type of Therapy: Process Group  Topic: Process; the first part of group was spent in process. Members shared about current issues and challenges in early recovery. Members were asked to identify what they had done to support their recovery and sobriety since the last group session.   Group Time: 2:45- 4pm  Participation Level: Active  Behavioral Response: Appropriate and Sharing  Type of Therapy: Psycho-education Group  Topic: Holiday Plans: the second half of group was spent in a psycho-ed. The focus of the session was on identifying potential relapse triggers to be addressed during the upcoming holiday weekend. Memorial Day starts tomorrow and the group does not meet on Monday. Members shared about their plans for the holiday and what strategies they had in place to insure they return on Wednesday clean and sober. The session included good feedback and self-disclosure among group members.    Summary: The patient reported she had attended 3 AA meetings since the last group session. She also attended her Bible Study on Wednesday evening and reported she had received a wonderful compliment from another member of the church. The older female member had told her that by disclosing her struggles with depression, it had freed other church members to share about their emotional struggles. The patient reported she had met with her counselor and S/O and the session had been good with her S/O responding more favorably after they met. She felt as if he had come to realize some things about their relationship as well as her addiction. The patient described what she intends to do over the holiday weekend. She also made a plan with another group member to meet on Monday and have their own "group" since we will not be in session on that holiday Monday. The patient  reported her appetite is returning. She continues to display good insight and a strong commitment to making these lifestyle changes. The patient's sobriety date remains 4/22.   Family Program: Family present? No   Name of family member(s):   UDS collected: No Results:  AA/NA attended?: YesWednesday, Thursday and Friday  Sponsor?: Yes, she has secured a temporary sponsor   Osha Errico, LCAS

## 2015-01-26 ENCOUNTER — Other Ambulatory Visit (HOSPITAL_COMMUNITY): Payer: 59 | Admitting: Psychology

## 2015-01-26 ENCOUNTER — Encounter (HOSPITAL_COMMUNITY): Payer: Self-pay | Admitting: Psychology

## 2015-01-26 DIAGNOSIS — F1221 Cannabis dependence, in remission: Secondary | ICD-10-CM | POA: Insufficient documentation

## 2015-01-26 DIAGNOSIS — F4310 Post-traumatic stress disorder, unspecified: Secondary | ICD-10-CM | POA: Insufficient documentation

## 2015-01-26 DIAGNOSIS — F1020 Alcohol dependence, uncomplicated: Secondary | ICD-10-CM | POA: Insufficient documentation

## 2015-01-27 ENCOUNTER — Encounter (HOSPITAL_COMMUNITY): Payer: Self-pay | Admitting: Psychology

## 2015-01-27 NOTE — Progress Notes (Addendum)
    Daily Group Progress Note  Program: CD-IOP   Group Time: 1-2:30 pm  Participation Level: Active  Behavioral Response: Appropriate and Sharing  Type of Therapy: Process Group  Topic: Process: the first part of group was spent in process. Members shared about the past holiday weekend. They shared about the recovery-oriented things they had done and also any challenges or temptations they may have experienced. Two members checked-in with new sobriety dates. Two new group members were also present and they introduced themselves. The two relapses were discussed at length. Drug tests were collected from all group members.   Group Time: 2:45- 4pm  Participation Level: Active  Behavioral Response: Appropriate  Type of Therapy: Psycho-education Group  Topic: The Wheel of Life; the second part of group was spent in a psycho-ed. Handouts were provided and members drew their own 'wheels' and presented them on the board one at a time. The wheels were divided into 8 categories and represented parts of a well-balanced and healthy life. The incongruities of the members' wheels were very clear and the importance of a balanced life emphasized in this psycho-educational session.   Summary: The patient reported she had attended 3 AA meetings over the holiday weekend. She stated that other than those meetings, she had been rather isolated and distracted. Another member reminded her that they had met for a meeting and then gone to lunch. She explained she had felt like she had slept a lot to avoid doing her 'recovery work'. The patient shared about having spent time with her sister and her niece and nephew. She admitted her sister tends to focus on her current boyfriend and almost ignore her children. She had found the 30 yo boy laying on the bathroom floor crying. The patient expressed frustration about what she might do to help these children. She feels as if her sister doesn't provide the time, attention  or validation that children need to be healthy and happy. The patient pointed out that this is what her sister experienced in her own childhood. Their mother would have different men in and out of the house and her sister seems to have concluded that this is the way women and mothers are to act. She cried as she recounted this painful history.  The group provided some good feedback and suggestions. The patient did not get to share her wheel because the group ran out of time, but most members did not get a chance to draw their wheels and she will on Friday. The patient seemed distracted during group and was distant during the psycho-ed in the second part of group. Her sobriety date remains 4/22.    Family Program: Family present? No   Name of family member(s):   UDS collected: Yes Results: pending  AA/NA attended?: YesMonday, Friday and Saturday  Sponsor?: No   Minka Knight, LCAS

## 2015-01-28 ENCOUNTER — Other Ambulatory Visit (HOSPITAL_COMMUNITY): Payer: 59 | Admitting: Licensed Clinical Social Worker

## 2015-01-28 DIAGNOSIS — F1020 Alcohol dependence, uncomplicated: Secondary | ICD-10-CM | POA: Diagnosis present

## 2015-01-28 DIAGNOSIS — F1221 Cannabis dependence, in remission: Secondary | ICD-10-CM | POA: Diagnosis not present

## 2015-01-28 DIAGNOSIS — F4310 Post-traumatic stress disorder, unspecified: Secondary | ICD-10-CM | POA: Diagnosis not present
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
## 2015-01-31 ENCOUNTER — Encounter (HOSPITAL_COMMUNITY): Payer: Self-pay | Admitting: Licensed Clinical Social Worker

## 2015-01-31 ENCOUNTER — Other Ambulatory Visit (HOSPITAL_COMMUNITY): Payer: 59 | Admitting: Licensed Clinical Social Worker

## 2015-01-31 DIAGNOSIS — F1020 Alcohol dependence, uncomplicated: Secondary | ICD-10-CM

## 2015-01-31 NOTE — Progress Notes (Signed)
    Daily Group Progress Note  Program: CD-IOP   Group Time: 1-2:30  Participation Level: Active  Behavioral Response: Appropriate and Sharing  Type of Therapy: Process Group  Topic: Process: After checking in, group members shared recovery-related activities and challenges they faced since the last group on Wednesday. They also reported any urges or cravings they may have experienced since the last group. Drug tests were returned. Several patients met with the program director Darlyne Russian, PA.    Group Time: 2:45-4  Participation Level: Active  Behavioral Response: Appropriate and Sharing  Type of Therapy: Psycho-education Group  Topic: The second half of group focused on a guided self-esteem meditation. The facilitator guided group members into meditation using deep breathing techniques, soft voice control and guided imagery. Most group members relaxed, some fell asleep and 2 group members reported they struggled with getting comfortable.      Summary: :  Patient reported she went to meetings Wednesday and Thursday. Patient reported she finally found someone to be her sponsor who has instructed her to call 2 alcoholics per day. She called 2 members of the group. Patient did not sleep well last night but got out of bed and went to an 8am meeting. She is scheduled to meet with her sponsor. The self-esteem meditation was "Just what I need." "I suffer with low self-esteem." When asked what does she see when she looks in the mirror she replies, "Someone who is not pretty." Many group members disagreed with her and replied how beautiful she is." Patient was able to take the compliments. She appeared relaxed during the self-guided meditation. Her sobriety date is 4/22.   Family Program: Family present? No   Name of family member(s):   UDS collected: No Results:   AA/NA attended?: YesWednesday, Thursday and Friday  Sponsor?: Yes   Amber Craig S, Licensed  Cli

## 2015-02-01 ENCOUNTER — Encounter (HOSPITAL_COMMUNITY): Payer: Self-pay | Admitting: Psychology

## 2015-02-01 ENCOUNTER — Encounter (HOSPITAL_COMMUNITY): Payer: Self-pay | Admitting: Licensed Clinical Social Worker
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
## 2015-02-02 ENCOUNTER — Other Ambulatory Visit (HOSPITAL_COMMUNITY): Payer: 59 | Admitting: Psychology

## 2015-02-02 DIAGNOSIS — F1620 Hallucinogen dependence, uncomplicated: Secondary | ICD-10-CM

## 2015-02-02 DIAGNOSIS — F1020 Alcohol dependence, uncomplicated: Secondary | ICD-10-CM

## 2015-02-02 DIAGNOSIS — F4312 Post-traumatic stress disorder, chronic: Secondary | ICD-10-CM

## 2015-02-02 DIAGNOSIS — Z62810 Personal history of physical and sexual abuse in childhood: Secondary | ICD-10-CM

## 2015-02-03 ENCOUNTER — Encounter (HOSPITAL_COMMUNITY): Payer: Self-pay | Admitting: Psychology

## 2015-02-03 NOTE — Progress Notes (Addendum)
    Daily Group Progress Note  Program: CD-IOP   Group Time: 1-2:30 pm  Participation Level: Active  Behavioral Response: Appropriate and Sharing  Type of Therapy: Process Group  Topic: Process; the first part of group was spent in process. Members shared about events and activities that they have engaged in to support their recoveries. Three members were absent for various reasons. Drug tests were collected from all members today.  Group Time: 2:45- 4pm  Participation Level: Active  Behavioral Response: Appropriate  Type of Therapy: Psycho-education Group  Topic: Bio-Psycho-Social-Spiritual; The second half of group was spent in a psycho-ed. Members were educated on the element that are believed to contribute to addiction. While the genetic predisposition is required, other elements contribute to the disease of addition. Members offered good examples and a lively discussion ensued. A graduation will be held on Friday and members expressed their feelings about missing the member who would be leaving them soon.   Summary: The patient reported she had attended 2 AA meetings since the last group session. She had walked in the [place] after the 10 am AA meeting today and found it very relaxing. The patient noted that in a meeting she had heard someone say something about early recovery and responded, "early recovery is crappy". Other people in the meeting agreed with her and this validated her own reality. The patient reported she has begun doing 'bedtime yoga' before going to bed and she is learning to relax and is sleeping better as a result of this practice. I pointed out she is becoming more familiar and comfortable with her body and this is also represents good progress in her recovery. The patient provided good feedback to her fellow group members and noted that by challenging one of the members last week, she had become more empowered about expressing her feelings.  She admitted at  one point during the session that "I feel as if I have a life again". The patient made some good comments and responded well to this intervention. Her sobriety date remains 4/22.   Family Program: Family present? No   Name of family member(s):   UDS collected: Yes Results: pending  AA/NA attended?: YesMonday and Tuesday  Sponsor?: Yes   Enaya Howze, LCAS

## 2015-02-04 ENCOUNTER — Other Ambulatory Visit (HOSPITAL_COMMUNITY): Payer: 59 | Admitting: Psychology

## 2015-02-04 DIAGNOSIS — F329 Major depressive disorder, single episode, unspecified: Secondary | ICD-10-CM

## 2015-02-04 DIAGNOSIS — F1020 Alcohol dependence, uncomplicated: Secondary | ICD-10-CM

## 2015-02-04 DIAGNOSIS — Z62810 Personal history of physical and sexual abuse in childhood: Secondary | ICD-10-CM

## 2015-02-04 DIAGNOSIS — F1620 Hallucinogen dependence, uncomplicated: Secondary | ICD-10-CM

## 2015-02-04 DIAGNOSIS — F32A Depression, unspecified: Secondary | ICD-10-CM

## 2015-02-07 ENCOUNTER — Encounter (HOSPITAL_COMMUNITY): Payer: Self-pay | Admitting: Psychology

## 2015-02-07 ENCOUNTER — Other Ambulatory Visit (HOSPITAL_COMMUNITY): Payer: 59 | Attending: Psychiatry | Admitting: Licensed Clinical Social Worker

## 2015-02-07 DIAGNOSIS — F1020 Alcohol dependence, uncomplicated: Secondary | ICD-10-CM

## 2015-02-07 DIAGNOSIS — F1620 Hallucinogen dependence, uncomplicated: Secondary | ICD-10-CM

## 2015-02-07 NOTE — Progress Notes (Signed)
    Daily Group Progress Note  Program: CD-IOP   Group Time: 1-2:30 pm  Participation Level: Active  Behavioral Response: Appropriate and Sharing  Type of Therapy: Process Group  Topic: Process; the first part of group was spent in process. Members shared about things they had done to support their recovery since the last group session. Also discussed were challenges to ongoing sobriety or events or situations that may have developed that invited feedback and conversation. One drug test was collected from a group member absent on Wednesday.  Group Time: 2:45- 4pm  Participation Level: Active  Behavioral Response: Appropriate  Type of Therapy: Psycho-education Group  Topic: Bio-Psycho-Social-Spiritual/Graduation: the second half of group was spent in a psycho-ed. The presentation on Bio-Psycho-Social- Spiritual, first presented on Wednesday, was completed this afternoon. Members had more questions about these 4 elements that contribute to addiction. The importance of addressing the 3 elements that one can do something about (we can't change our biological inheritance) were discussed at length with numerous examples provided by group members. Near the conclusion of the session a graduation ceremony was held. There were kind words and tears shed by the graduating member as he expressed his gratitude about his treatment and his "new life". The session today was emotional, memorable and powerful for everyone present.   Summary: The patient reported she had gone to 1 AA meeting since the last session. She had been very busy and realized she had not done her bed-time yoga in 2 days. She had felt tension yesterday and took the time to do the yoga and was immediately more relaxed. She admitted that the other day she had had a 'thought about drinking', but she had quickly challenged the thought with her self-talk and distracted herself.  The patient provided some good feedback to a fellow group  member and agreed that he seems to put out "a lot of smoke and mirrors". She encouraged him to "keep it real". The patient made some good comments about the psycho-ed presentation and was able to relate to all of the 4 elements in her own childhood. The patient was very expressive about her admiration towards the graduating member. She responded well to this intervention and her sobriety date remains 4/22.    Family Program: Family present? No   Name of family member(s):   UDS collected: No Results:   AA/NA attended?: YesThursday  Sponsor?: Yes   Marsalis Beaulieu, LCAS

## 2015-02-08 ENCOUNTER — Encounter (HOSPITAL_COMMUNITY): Payer: Self-pay | Admitting: Licensed Clinical Social Worker

## 2015-02-08 NOTE — Progress Notes (Signed)
    Daily Group Progress Note  Program: CD-IOP   Group Time: 1-2:30  Participation Level: Active  Behavioral Response: Appropriate and Sharing  Type of Therapy: Process Group  Topic: After checking in, group members shared recovery-related activities and challenges they faced since the last group on Friday. Patients reported any cravings or urges they may have experienced over the weekend. One patient returned to the program and another patient relapsed and was referred to a higher level of care.  Drug tests were taken and drug test results from last week were returned. Group Time: 2:45-4  Participation Level: Active  Behavioral Response: Appropriate and Sharing  Type of Therapy: Psycho-education Group  Topic: The second part of group focused on cognitive distortions. A staff member from the [facility] facilitated session # 5 of the 8 week program titled "Down the Rabbit Hole". The facilitator uses multi-media in each presentation. This week's media involves the use of a song and a clip from a movie.  Each week 2 cognitive distortions will be discussed along with coping tools to change thinking patterns. This week the 2 cognitive distortions discussed were: the shoulds and emotional reasoning. Group members were actively engaged in identifying rational thought that can be used to combat the distorted thought and determine how the distortion may have manifested in their own lives. The coping tool discussed for these 2 cognitive distortions: facts vs feelings. Patients learned that decisions should be made from the wise mind instead of the emotional mind. Each patient completed the worksheets on cognitive distortions.      Summary: Patient reported Friday she and 5 other group members went out for coffee after group and then together went to a 5:30 meeting. They all went together to support a group member who had relapsed. Saturday she met her sponsor and then went to a  meeting together. She is continuing to do her bedtime yoga every night as part of her self-care. Sunday she was in church all day for a celebration. Patient reported she is praying a lot more and she feels better about her spirituality. She is grateful for her sobriety and the circle of positive people she has met in AA and CD-IOP. Patient was an active participant in the cognitive distortion discussion. She completed the worksheets and was able to identify the "should" in her life. "I shouldn't have gotten mad when someone offended me." "I should be over the pain of my past." She worked through using the [place] for her "should." Her sobriety date remains 4/22.   Family Program: Family present? No   Name of family member(s):   UDS collected: Yes Results: Pending  AA/NA attended?: YesMonday and Friday  Sponsor?: Yes   Bush Murdoch S, Licensed Cli

## 2015-02-09 ENCOUNTER — Other Ambulatory Visit (HOSPITAL_COMMUNITY): Payer: 59 | Admitting: Psychology

## 2015-02-09 DIAGNOSIS — F329 Major depressive disorder, single episode, unspecified: Secondary | ICD-10-CM

## 2015-02-09 DIAGNOSIS — F1020 Alcohol dependence, uncomplicated: Secondary | ICD-10-CM

## 2015-02-09 DIAGNOSIS — F32A Depression, unspecified: Secondary | ICD-10-CM

## 2015-02-09 DIAGNOSIS — F1620 Hallucinogen dependence, uncomplicated: Secondary | ICD-10-CM

## 2015-02-09 DIAGNOSIS — Z62810 Personal history of physical and sexual abuse in childhood: Secondary | ICD-10-CM

## 2015-02-10 ENCOUNTER — Encounter (HOSPITAL_COMMUNITY): Payer: Self-pay | Admitting: Psychology
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
## 2015-02-11 ENCOUNTER — Other Ambulatory Visit (HOSPITAL_COMMUNITY): Payer: 59 | Admitting: Psychology

## 2015-02-14 ENCOUNTER — Other Ambulatory Visit (HOSPITAL_COMMUNITY): Payer: 59 | Admitting: Licensed Clinical Social Worker

## 2015-02-14 ENCOUNTER — Encounter (HOSPITAL_COMMUNITY): Payer: Self-pay | Admitting: Psychology

## 2015-02-14 DIAGNOSIS — F1020 Alcohol dependence, uncomplicated: Secondary | ICD-10-CM | POA: Diagnosis not present

## 2015-02-14 NOTE — Progress Notes (Signed)
Daily Group Progress Note  Program: CD-IOP   Group Time: 1-2:30 pm  Participation Level: Active  Behavioral Response: Appropriate and Sharing  Type of Therapy: Process Group  Topic: Process: the first half of group was spent in process. Members shared about things they had done since the last group session to support their recovery.  A new group member was present and he introduced himself. The program director met with this new group member during the session today. Drug tests were collected from him and another member as well.   Group Time: 2:45- 4pm  Participation Level: Active  Behavioral Response: Appropriate and Sharing  Type of Therapy: Psycho-education Group  Topic: Biology of Addiction; Dopamine, what you can do about it? The second half of group was spent in a psycho-ed on the neurobiology of addiction. The chemical interactions that occur in the brain were discussed as well as the amount of dopamine discharged by certain drugs. Members were asked to identify what they must do to address these chemical reactions that occur based on their environment and inner feelings.    Summary: The patient reported that today was her birthday and she showed the Fitbit Blaze watch on her wrist. It was a gift from her S/O. she seemed very pleased about her birthday. She reported that she had gone to 1 AA meeting since the last group session. The patient admitted she was concerned about what she might say to her S/O since Social Services had contacted and come by his parent's home. She had disclosed in group on Wednesday that she had been keeping the mother's pain pills and dispensing them herself since the husband was taking them. The group reminded her that she had only told this in group and the substitute counselor had made the decision to report this to DSS herself. There had really been no choice in the matter and she wasn't to blame. She had shared this news because she felt very  guilty and felt she was being dishonest. She knows that honesty is essential in recovery and she intends to remain sober going forward. The patient received the feedback well and seemed to feel better about herself and her intention to disclose this to her S/O. the patient will be graduating on Monday and reported she is excited to return to work and incorporate what she has learned here into her daily life. The patient responded well to this intervention and her sobriety date remains 4/22.   Family Program: Family present? No   Name of family member(s):   UDS collected: No Results:   AA/NA attended?: YesThursday  Sponsor?: Yes   EVANS,ANN, LCAS        

## 2015-02-15 ENCOUNTER — Encounter (HOSPITAL_COMMUNITY): Payer: Self-pay | Admitting: Licensed Clinical Social Worker

## 2015-02-15 ENCOUNTER — Ambulatory Visit (HOSPITAL_COMMUNITY): Payer: Self-pay | Admitting: Psychiatry

## 2015-02-15 NOTE — Progress Notes (Signed)
    Daily Group Progress Note  Program: CD-IOP   Group Time: 1-2:30  Participation Level: Active  Behavioral Response: Appropriate and Sharing  Type of Therapy: Process Group  Topic: After checking in, group members shared recovery-related activities and challenges they faced since the last group on Friday. Patients reported any cravings or urges they may have experienced over the weekend. One patient was absent from group today. One patient is graduating from the program today. Drug tests were taken today.    Group Time: 2:45-4  Participation Level: Active  Behavioral Response: Appropriate and Sharing  Type of Therapy: Psycho-education Group  Topic: The second part of group focused on cognitive distortions. A staff member from the [facility] facilitated session # 6 of the 8 week program titled "Down the Rabbit Hole". The facilitator uses multi-media in each presentation. This week's media involves the use of a parable from the Bible and a clip from a movie.  Each week 2 cognitive distortions will be discussed along with coping tools to change thinking patterns. This week the 2 cognitive distortions discussed were: Fallacy of fairness and Heaven's reward fallacy. Group members were actively engaged in identifying rational thought that can be used to combat the distorted thought and determine how the distortion may have manifested in their own lives. The coping tool discussed for these 2 cognitive distortions was simply a word: humility. Patients learned that life isn't fair for anyone but there are always moments of beauty, light and hope amidst the unfairness of it all. Each patient completed the worksheets on cognitive distortions.      Summary: Patient celebrated her birthday Friday night with her family and boyfriend. It was a good birthday because she is sober. On Sunday she went to church for [employer] Day celebration for most of the day. She did not attend any  meetings this weekend, but calls her sponsor daily. She is also going to make her home group the AA meeting at [employer]. Today she is graduating from the program. She had family members and her boyfriend present who celebrated her accomplishment. Patient participated in the fallacy of fairness and reported "I always wanted life to be fair." Patient will return to work Wednesday. She is excited about going back to work to be with her "work family" and working with her patients. 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 She has remained sober and has a desire to live sober. Her sobriety date is 4/22.   Family Program: Family present? No   Name of family member(s):   UDS collected: No Results:  AA/NA attended?: No  Sponsor?: Yes   MACKENZIE,LISBETH S, Licensed Cli

## 2015-02-16 ENCOUNTER — Encounter: Payer: Self-pay | Admitting: Psychiatry

## 2015-02-16 ENCOUNTER — Other Ambulatory Visit (HOSPITAL_COMMUNITY): Payer: 59

## 2015-02-18 ENCOUNTER — Other Ambulatory Visit (HOSPITAL_COMMUNITY): Payer: 59

## 2015-02-21 ENCOUNTER — Other Ambulatory Visit (HOSPITAL_COMMUNITY): Payer: 59

## 2015-02-23 ENCOUNTER — Other Ambulatory Visit (HOSPITAL_COMMUNITY): Payer: 59

## 2015-02-25 ENCOUNTER — Other Ambulatory Visit (HOSPITAL_COMMUNITY): Payer: 59

## 2015-03-02 ENCOUNTER — Other Ambulatory Visit (HOSPITAL_COMMUNITY): Payer: 59

## 2015-03-02 ENCOUNTER — Ambulatory Visit (HOSPITAL_COMMUNITY): Payer: Self-pay | Admitting: Psychiatry

## 2015-03-04 ENCOUNTER — Other Ambulatory Visit (HOSPITAL_COMMUNITY): Payer: 59

## 2015-03-07 ENCOUNTER — Other Ambulatory Visit (HOSPITAL_COMMUNITY): Payer: 59

## 2015-03-09 ENCOUNTER — Other Ambulatory Visit (HOSPITAL_COMMUNITY): Payer: 59

## 2015-03-11 ENCOUNTER — Other Ambulatory Visit (HOSPITAL_COMMUNITY): Payer: 59

## 2015-03-14 ENCOUNTER — Other Ambulatory Visit (HOSPITAL_COMMUNITY): Payer: 59

## 2015-03-16 ENCOUNTER — Other Ambulatory Visit (HOSPITAL_COMMUNITY): Payer: 59

## 2015-03-18 ENCOUNTER — Other Ambulatory Visit (HOSPITAL_COMMUNITY): Payer: 59

## 2015-04-21 ENCOUNTER — Ambulatory Visit (HOSPITAL_COMMUNITY): Payer: Self-pay | Admitting: Medical

## 2015-04-28 ENCOUNTER — Ambulatory Visit (HOSPITAL_COMMUNITY): Payer: Self-pay | Admitting: Medical

## 2015-06-02 ENCOUNTER — Ambulatory Visit (HOSPITAL_COMMUNITY): Payer: Self-pay | Admitting: Psychiatry

## 2015-07-19 ENCOUNTER — Ambulatory Visit (INDEPENDENT_AMBULATORY_CARE_PROVIDER_SITE_OTHER): Payer: 59 | Admitting: Psychiatry

## 2015-07-19 ENCOUNTER — Encounter (INDEPENDENT_AMBULATORY_CARE_PROVIDER_SITE_OTHER): Payer: Self-pay

## 2015-07-19 ENCOUNTER — Encounter (HOSPITAL_COMMUNITY): Payer: Self-pay | Admitting: Psychiatry

## 2015-07-19 VITALS — BP 120/90 | HR 60 | Ht 63.0 in | Wt 142.2 lb

## 2015-07-19 DIAGNOSIS — Z62810 Personal history of physical and sexual abuse in childhood: Secondary | ICD-10-CM | POA: Diagnosis not present

## 2015-07-19 DIAGNOSIS — F1020 Alcohol dependence, uncomplicated: Secondary | ICD-10-CM

## 2015-07-19 DIAGNOSIS — F1590 Other stimulant use, unspecified, uncomplicated: Secondary | ICD-10-CM

## 2015-07-19 DIAGNOSIS — F4312 Post-traumatic stress disorder, chronic: Secondary | ICD-10-CM | POA: Diagnosis not present

## 2015-07-19 DIAGNOSIS — F1620 Hallucinogen dependence, uncomplicated: Secondary | ICD-10-CM

## 2015-07-19 MED ORDER — ESCITALOPRAM OXALATE 20 MG PO TABS: 20.0000 mg | ORAL_TABLET | Freq: Every day | ORAL | Status: DC

## 2015-07-19 MED ORDER — BUSPIRONE HCL 15 MG PO TABS: 15.0000 mg | ORAL_TABLET | Freq: Three times a day (TID) | ORAL | Status: DC

## 2015-07-19 MED ORDER — TRAZODONE HCL 50 MG PO TABS: 50.0000 mg | ORAL_TABLET | Freq: Every evening | ORAL | Status: DC | PRN
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
## 2015-08-03 ENCOUNTER — Other Ambulatory Visit (HOSPITAL_COMMUNITY): Payer: 59 | Admitting: Psychiatry

## 2015-08-03 ENCOUNTER — Encounter (HOSPITAL_COMMUNITY): Payer: Self-pay | Admitting: Psychiatry

## 2015-08-03 DIAGNOSIS — R45851 Suicidal ideations: Secondary | ICD-10-CM | POA: Diagnosis not present

## 2015-08-03 DIAGNOSIS — F4312 Post-traumatic stress disorder, chronic: Secondary | ICD-10-CM | POA: Diagnosis not present

## 2015-08-03 DIAGNOSIS — Z62810 Personal history of physical and sexual abuse in childhood: Secondary | ICD-10-CM | POA: Insufficient documentation

## 2015-08-03 DIAGNOSIS — F4310 Post-traumatic stress disorder, unspecified: Secondary | ICD-10-CM | POA: Insufficient documentation

## 2015-08-03 DIAGNOSIS — F331 Major depressive disorder, recurrent, moderate: Secondary | ICD-10-CM | POA: Diagnosis present

## 2015-08-03 DIAGNOSIS — F419 Anxiety disorder, unspecified: Secondary | ICD-10-CM | POA: Diagnosis not present

## 2015-08-03 DIAGNOSIS — F1020 Alcohol dependence, uncomplicated: Secondary | ICD-10-CM | POA: Diagnosis not present

## 2015-08-03 DIAGNOSIS — F17210 Nicotine dependence, cigarettes, uncomplicated: Secondary | ICD-10-CM | POA: Insufficient documentation

## 2015-08-03 MED ORDER — GABAPENTIN 300 MG PO CAPS: 300.0000 mg | ORAL_CAPSULE | Freq: Three times a day (TID) | ORAL | Status: DC
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
## 2015-08-03 NOTE — Progress Notes (Signed)
    Daily Group Progress Note  Program: IOP  Group Time: 9:00-10:30  Participation Level: Active  Behavioral Response: Appropriate  Type of Therapy:  Group Therapy  Summary of Progress: Pt. Completed intake with case manager and psychiatrist.      Group Time: 10:30-12:00  Participation Level:  None  Behavioral Response: Appropriate  Type of Therapy: Psycho-education Group  Summary of Progress: Pt. Was excused from second half of group.  Nancie Neas, LPC

## 2015-08-04 ENCOUNTER — Other Ambulatory Visit (HOSPITAL_COMMUNITY): Payer: 59

## 2015-08-04 ENCOUNTER — Telehealth (HOSPITAL_COMMUNITY): Payer: Self-pay | Admitting: Psychiatry

## 2015-08-05 ENCOUNTER — Other Ambulatory Visit (HOSPITAL_COMMUNITY): Payer: 59 | Attending: Psychiatry | Admitting: Psychiatry

## 2015-08-05 DIAGNOSIS — F331 Major depressive disorder, recurrent, moderate: Secondary | ICD-10-CM | POA: Diagnosis not present

## 2015-08-05 NOTE — Progress Notes (Signed)
    Daily Group Progress Note  Program: IOP  Group Time: 9:00-10:30  Participation Level: Active  Behavioral Response: Resistant  Type of Therapy:  Group Therapy  Summary of Progress: Pt. Presented with primarily flat affect, depressed, and guarded. Pt. Discussed that she believes that she has been depressed all of her life, that her depression was brought to her attention around 34 years old. Pt. Discussed being challenged by depression and chemical dependence diagnosis and that she does not like taking medication and has had difficult time committing to individual therapy. Pt. Reports that her work environment is a significant stressor, expressed problems with compassion fatigue and inadequate self-care.     Group Time: 10:30-12:00  Participation Level:  Active  Behavioral Response: Appropriate  Type of Therapy: Psycho-education Group  Summary of Progress: Pt. Participated in grief and loss group with Jeanella Craze.  Nancie Neas, LPC

## 2015-08-08 ENCOUNTER — Other Ambulatory Visit (HOSPITAL_COMMUNITY): Payer: 59 | Admitting: Psychiatry

## 2015-08-08 DIAGNOSIS — F331 Major depressive disorder, recurrent, moderate: Secondary | ICD-10-CM | POA: Diagnosis not present

## 2015-08-09 ENCOUNTER — Other Ambulatory Visit (HOSPITAL_COMMUNITY): Payer: 59
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
## 2015-08-10 ENCOUNTER — Other Ambulatory Visit (HOSPITAL_COMMUNITY): Payer: 59 | Admitting: Psychiatry

## 2015-08-10 DIAGNOSIS — F331 Major depressive disorder, recurrent, moderate: Secondary | ICD-10-CM | POA: Diagnosis not present

## 2015-08-11 ENCOUNTER — Other Ambulatory Visit (HOSPITAL_COMMUNITY): Payer: 59
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
## 2015-08-12 ENCOUNTER — Other Ambulatory Visit (HOSPITAL_COMMUNITY): Payer: 59 | Admitting: Psychiatry

## 2015-08-12 DIAGNOSIS — F331 Major depressive disorder, recurrent, moderate: Secondary | ICD-10-CM

## 2015-08-12 NOTE — Progress Notes (Signed)
    Daily Group Progress Note  Program: IOP  Group Time: 9:00-10:30  Participation Level: Active  Behavioral Response: Appropriate  Type of Therapy:  Group Therapy  Summary of Progress: Pt. Continues to present with brightened affect, talkative, appropriately tearful, engaged in the group process. Pt. Discussed feeling much better and happier and attributed change in mood to the medication working. Pt. Aware of pattern of numbing with chemical substances over the past year and processing feelings and events from her childhood. Pt. Also developing awareness of poor boundaries in current relationship and making decision about whether she needs to end the relationship.     Group Time: 10:30-12:00  Participation Level:  Active  Behavioral Response: Appropriate  Type of Therapy: Psycho-education Group  Summary of Progress: Pt. Participated in grief and loss session with Jeanella Craze.  Nancie Neas, LPC

## 2015-08-15 ENCOUNTER — Other Ambulatory Visit (HOSPITAL_COMMUNITY): Payer: 59 | Admitting: Psychiatry

## 2015-08-15 DIAGNOSIS — F331 Major depressive disorder, recurrent, moderate: Secondary | ICD-10-CM | POA: Diagnosis not present

## 2015-08-16 ENCOUNTER — Other Ambulatory Visit (HOSPITAL_COMMUNITY): Payer: 59

## 2015-08-16 NOTE — Progress Notes (Signed)
    Daily Group Progress Note  Program: IOP  Group Time: 9:00-10:30  Participation Level: Active  Behavioral Response: Appropriate  Type of Therapy:  Psycho-education Group  Summary of Progress: Pt. Participated in medication management group with Jiles Garter.     Group Time: 10:30-12:00  Participation Level:  Active  Behavioral Response: Appropriate  Type of Therapy: Group Therapy  Summary of Progress: Pt. Presented with bright affect, talkative, laughed appropriately. Pt. Participated in vision board goal setting activity that included seeking work and career change opportunities and setting healthy relationship boundaries.  Nancie Neas, LPC

## 2015-08-17 ENCOUNTER — Other Ambulatory Visit (HOSPITAL_COMMUNITY): Payer: 59 | Admitting: Psychiatry

## 2015-08-17 DIAGNOSIS — F331 Major depressive disorder, recurrent, moderate: Secondary | ICD-10-CM

## 2015-08-18 ENCOUNTER — Other Ambulatory Visit (HOSPITAL_COMMUNITY): Payer: 59

## 2015-08-18 NOTE — Progress Notes (Signed)
    Daily Group Progress Note  Program: IOP  Group Time: 9:00-10:30  Participation Level: Active  Behavioral Response: Appropriate  Type of Therapy:  Group Therapy  Summary of Progress: Pt. Presents with brightened affect, talks and laughs appropriately, and engaged in the group process. Pt. Discussed processing of learning what she is passionate about i.e., food and cooking and caring for people. Pt. Discussed her relationship with her dog and how the dog is a source of emotional support for her. Pt. Discussed tearfully how grateful she is for the progress that she has made in her recovery with depression and chemical dependency.     Group Time: 10:30-12:00  Participation Level:  Active  Behavioral Response: Appropriate  Type of Therapy: Psycho-education Group  Summary of Progress: Pt. Participated in discussion about developing a meditation practice and instruction about RAIN meditation method i.e., recognize, allow, investigate, and non-identification.  Nancie Neas, LPC

## 2015-08-19 ENCOUNTER — Other Ambulatory Visit (HOSPITAL_COMMUNITY): Payer: 59

## 2015-08-23 ENCOUNTER — Ambulatory Visit (INDEPENDENT_AMBULATORY_CARE_PROVIDER_SITE_OTHER): Payer: 59 | Admitting: Psychiatry

## 2015-08-23 ENCOUNTER — Encounter (HOSPITAL_COMMUNITY): Payer: Self-pay | Admitting: Psychiatry

## 2015-08-23 VITALS — BP 129/90 | HR 93 | Ht 65.25 in | Wt 143.8 lb

## 2015-08-23 DIAGNOSIS — F4310 Post-traumatic stress disorder, unspecified: Secondary | ICD-10-CM | POA: Diagnosis not present

## 2015-08-23 DIAGNOSIS — F1010 Alcohol abuse, uncomplicated: Secondary | ICD-10-CM | POA: Insufficient documentation

## 2015-08-23 DIAGNOSIS — F332 Major depressive disorder, recurrent severe without psychotic features: Secondary | ICD-10-CM

## 2015-08-23 DIAGNOSIS — F411 Generalized anxiety disorder: Secondary | ICD-10-CM

## 2015-08-23 DIAGNOSIS — G4700 Insomnia, unspecified: Secondary | ICD-10-CM | POA: Insufficient documentation

## 2015-08-23 MED ORDER — ESCITALOPRAM OXALATE 20 MG PO TABS: 20.0000 mg | ORAL_TABLET | Freq: Every day | ORAL | Status: DC

## 2015-08-23 MED ORDER — TRAZODONE HCL 50 MG PO TABS: 50.0000 mg | ORAL_TABLET | Freq: Every evening | ORAL | Status: DC | PRN

## 2015-08-23 MED ORDER — ACAMPROSATE CALCIUM 333 MG PO TBEC: 333.0000 mg | DELAYED_RELEASE_TABLET | Freq: Three times a day (TID) | ORAL | Status: DC

## 2015-08-23 MED ORDER — GABAPENTIN 300 MG PO CAPS: 300.0000 mg | ORAL_CAPSULE | Freq: Three times a day (TID) | ORAL | Status: DC
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
## 2015-08-24 ENCOUNTER — Other Ambulatory Visit (HOSPITAL_COMMUNITY): Payer: 59 | Admitting: *Deleted

## 2015-08-24 DIAGNOSIS — F331 Major depressive disorder, recurrent, moderate: Secondary | ICD-10-CM | POA: Diagnosis not present

## 2015-08-24 DIAGNOSIS — F1010 Alcohol abuse, uncomplicated: Secondary | ICD-10-CM

## 2015-08-24 DIAGNOSIS — F411 Generalized anxiety disorder: Secondary | ICD-10-CM

## 2015-08-24 DIAGNOSIS — G4700 Insomnia, unspecified: Secondary | ICD-10-CM

## 2015-08-24 MED ORDER — GABAPENTIN 300 MG PO CAPS: 300.0000 mg | ORAL_CAPSULE | Freq: Four times a day (QID) | ORAL | Status: DC

## 2015-08-24 NOTE — Progress Notes (Signed)
Gabapentin is helping but tid sometimes is not quite enough coverage she says so will increase it to 4 times daily

## 2015-08-24 NOTE — Progress Notes (Deleted)
    Daily Group Progress Note  Program: IOP  Group Time:   Participation Level: {CHL AMB BH Group Participation:21022742}  Behavioral Response: {CHL AMB BH Group Behavior:21022743}  Type of Therapy:  {CHL AMB BH Type of Therapy:21022741}  Summary of Progress: ***     Group Time:   Participation Level:  {CHL AMB BH Group Participation:21022742}  Behavioral Response: {CHL AMB BH Group Behavior:21022743}  Type of Therapy: {CHL AMB BH Type of Therapy:21022741}  Summary of Progress: ***  BH-PIOPB PSYCH

## 2015-08-25 ENCOUNTER — Other Ambulatory Visit (HOSPITAL_COMMUNITY): Payer: 59 | Admitting: Psychiatry

## 2015-08-25 DIAGNOSIS — F411 Generalized anxiety disorder: Secondary | ICD-10-CM

## 2015-08-25 DIAGNOSIS — F331 Major depressive disorder, recurrent, moderate: Secondary | ICD-10-CM | POA: Diagnosis not present

## 2015-08-25 NOTE — Progress Notes (Signed)
    Daily Group Progress Note  Program: IOP  Group Time:0900-1045   Participation Level: Active  Behavioral Response: Appropriate and Sharing  Type of Therapy:  Group Therapy  Summary of Progress: Pt voiced that she is feeling very anxious about her upcoming discharge.  Pt pulled this writer outside of group and stated that she feels like she needs to be hospitalized.  Pt denies SI/HI or A/V hallucinations.  Explained to pt that it is very common to feel all the anxiety that she is feeling because she is on the road to recovery and she has never felt this way before.  Encouraged pt to get a follow up appointment with Dr. Hart Carwin as soon as possible.  Pt voiced that she can no longer afford Dr. Hart Carwin, but doesn't want to start over with a new therapist.  Recommended that pt call and discuss with Dr. Hart Carwin and maybe she'll offer a sliding scale, since pt is an established pt.  Highly recommended pt to start attending support groups and to contact [facility] or [employer].  Pt was very tearful during group today.     Group Time: 1100-1200  Participation Level:  Active  Behavioral Response: Appropriate and Sharing  Type of Therapy: Psycho-education Group  Summary of Progress: Warning Signals of Relapse:  Discussed the various signs and signals of relapse and steps to take towards recovery.  Then later guest speaker:  Otho Ket, RN, [facility]) discussed what her organization provides for free within the community.  Mentioned the orientation process and was able to schedule an appointment.  Also, she went on to share her story after answering several questions.      Dellia Nims, M.Ed, CNA

## 2015-08-26 ENCOUNTER — Other Ambulatory Visit (HOSPITAL_COMMUNITY): Payer: 59 | Admitting: Psychiatry

## 2015-08-26 DIAGNOSIS — F331 Major depressive disorder, recurrent, moderate: Secondary | ICD-10-CM | POA: Diagnosis not present

## 2015-08-26 DIAGNOSIS — F411 Generalized anxiety disorder: Secondary | ICD-10-CM
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
## 2015-08-30 ENCOUNTER — Other Ambulatory Visit (HOSPITAL_COMMUNITY): Payer: 59

## 2015-08-30 NOTE — Progress Notes (Signed)
    Daily Group Progress Note  Program: IOP  Amber Craig  Daily Group Progress Note Program: IOP  08/24/2015   Group Time: 9 AM - 10:30 AM Participation Level: Active  Behavioral Response: Appropriate, Sharing, and Motivated Type of Therapy:  Process Group Summary of Progress: Patients processed their individual experiences with home practice HALT Exercise. Patient was attentive to others and appeared interested in exercise as evidenced by her note taking and questions to other group members. As patient was absent yesterday she shared her frustrations from holidays; feeling overwhelmed by requests and expectations from others. Patient was willing to process how she might set some boundaries with others in her household.    Group Time: 10:45 AM - 12 PM Participation Level:  Active, Minimal or none Behavioral Response: Appropriate, Sharing, and Assertive Type of Therapy: Psycho-education Group Summary of Progress: Facilitator presented 'Wheel of Life' concept with short discussion of eight aspects intrinsic in most lives: Physical environment, Designer, jewellery, Family and Friends, Significant other, Fun and recreation, Health, Chiropodist and personal growth. Patients then ranked their current satisfaction and future goals of satisfaction to see where they may wish to focus some energy.  Patient shared her feelings associated with the exercise and thought it was helpful to see areas where she needs/wants improvement.  Pt took on an assertive manner with her remarks during closing for another group member whose last day is today. Others in group offered encouragement to patient to do same with members of her household.  [place], LCSW

## 2015-08-31 ENCOUNTER — Other Ambulatory Visit (HOSPITAL_COMMUNITY): Payer: 59 | Attending: Psychiatry

## 2015-08-31 DIAGNOSIS — F331 Major depressive disorder, recurrent, moderate: Secondary | ICD-10-CM | POA: Insufficient documentation

## 2015-08-31 DIAGNOSIS — F411 Generalized anxiety disorder: Secondary | ICD-10-CM | POA: Insufficient documentation

## 2015-09-01 ENCOUNTER — Other Ambulatory Visit (HOSPITAL_COMMUNITY): Payer: 59 | Admitting: Psychiatry

## 2015-09-01 DIAGNOSIS — F411 Generalized anxiety disorder: Secondary | ICD-10-CM | POA: Diagnosis not present

## 2015-09-01 DIAGNOSIS — F331 Major depressive disorder, recurrent, moderate: Secondary | ICD-10-CM | POA: Diagnosis not present

## 2015-09-01 NOTE — Progress Notes (Signed)
    Daily Group Progress Note  Program: IOP  Group Time: 9:00-10:30  Participation Level: Active  Behavioral Response: Appropriate  Type of Therapy:  Group Therapy  Summary of Progress: Pt. Met with case manager and psychiatrist for preparation for discharge. Pt. Reported that work has been difficult, but she is doing much better with setting healthier relationship boundaries and is making better choices for stress management.     Group Time: 10:30-12:00  Participation Level:  Active  Behavioral Response: Appropriate  Type of Therapy: Psycho-education Group  Summary of Progress: Pt. Watched and discussed Almond Lint video about developing vulnerability as resilience to shame.  Nancie Neas, LPC

## 2015-09-01 NOTE — Patient Instructions (Signed)
Patient completed MH-IOP today.  Will follow up with Dr. Doyne Keel on 10-04-15 @ 2:30 pm and Dr. Hart Carwin (pt will schedule follow up appointment).  Encouraged support groups.

## 2015-09-01 NOTE — Progress Notes (Signed)
Amber Craig is a 35 yr old divorced, employed, [place] American female, who was referred per Dr. Salem Senate, treatment for worsening depressive, anxiety (PTSD) symptoms along with ETOH dependence issues. Admitted to vague SI, denies a plan or intent. Reported deterrent is her family. Pt was able to contract for safety. Pt successfully completed CD-IOP. Was admitted in the program from May 2016 until June 2016. According to pt, she hasn't followed through with attending [facility] meetings or obtaining a sponsor. She relapsed on ETOH in July 2016. Stated she drinks one bottle of wine everyday. Most recent drink was 07-31-15 (one bottle). Pt has a hx of using Molly and THC. Denies any drug use; but admits to continued struggle with ETOH.  Denies hx of DUI. Smokes a half pack of cigarettes a day. Denies any legal issues. 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 Transferred to first shift, where she works twelve hr shifts. Supervisor is working with pt so she can attend MH-IOP. 2) Boyfriend of eight years. According to pt, he has become distant due to her excessive drinking (ETOH). "Our lease is up in April 2017 and I'm afraid he may decide to leave the relationship." 3) Financial Strain.  Pt will complete MH-IOP today.  Pt continues to struggle with anxiety.  According to pt, she is thinking about ending her relationship with her boyfriend.  Denies SI/HI or A/V hallucinations.  A:  D/C today.  F/U with Dr. Doyne Keel on 10-04-15 @ 2:30 pm and pt will schedule an appointment with Dr. Bishop Limbo.  Encouraged support groups.  Referral to [facility].  R:  Pt receptive.    Carlis Abbott, RITA, M.Ed, CNA

## 2015-09-01 NOTE — Progress Notes (Signed)
Patient ID: [name], female   DOB: February 22, 1981, 35 y.o.   MRN: [id number] Discharge Note  Patient:  Amber Craig is an 35 y.o., female DOB:  1981-02-24  Date of Admission:  (Not on file)  Date of Discharge:  09/02/2015  Reason for Admission:depression  IOP Course:  Attended and participated.  Feeling much better, more optimistic, less anxious and depressed.  Says she remains depressed but realizes she has to do the work with the help of a therapist and feels up to the task  Mental Status at [place] depressed and anxious but much less so and optimistic that things will continue to improve for her.  Lab Results: No results found for this or any previous visit (from the past 48 hour(s)).   Current outpatient prescriptions:  .  acamprosate (CAMPRAL) 333 MG tablet, Take 1 tablet (333 mg total) by mouth 3 (three) times daily with meals., Disp: 90 tablet, Rfl: 1 .  escitalopram (LEXAPRO) 20 MG tablet, Take 1 tablet (20 mg total) by mouth daily., Disp: 30 tablet, Rfl: 1 .  gabapentin (NEURONTIN) 300 MG capsule, Take 1 capsule (300 mg total) by mouth 4 (four) times daily., Disp: 120 capsule, Rfl: 1 .  traZODone (DESYREL) 50 MG tablet, Take 1 tablet (50 mg total) by mouth at bedtime as needed for sleep., Disp: 30 tablet, Rfl: 1 .  Vitamin D, Ergocalciferol, (DRISDOL) 50000 UNITS CAPS capsule, Reported on 08/23/2015, Disp: , Rfl: 0  Axis Diagnosis:  Major depression, recurrent moderate.  Generalized anxiety disorder   Level of Care:  IOP  Discharge destination:  Other:  has appointments with a psychiatrist and therapist  Is patient on multiple antipsychotic therapies at discharge:  No    Has Patient had three or more failed trials of antipsychotic monotherapy by history:  Negative  Patient phone:  [phone] (home)  Patient address:   [address] [place] [address],   Follow-up recommendations:  Activity:  continue current activity Diet:  continue current  diet  Comments:  none  The patient received suicide prevention pamphlet:  Yes Belongings returned:  na  Donnelly Angelica 09/01/2015, 5:06 PM

## 2015-09-02 ENCOUNTER — Other Ambulatory Visit (HOSPITAL_COMMUNITY): Payer: 59

## 2015-09-05 ENCOUNTER — Other Ambulatory Visit (HOSPITAL_COMMUNITY): Payer: 59

## 2015-09-06 ENCOUNTER — Other Ambulatory Visit (HOSPITAL_COMMUNITY): Payer: 59

## 2015-09-06 IMAGING — RF DG HYSTEROGRAM
8 series · 8 of 8 positions shown · IV contrast (omnipaque)
Comparison: None.

FLUOROSCOPY TIME:  1 min 12 seconds

CLINICAL DATA: Infertility

EXAM:
HYSTEROSALPINGOGRAM
TECHNIQUE: Following cleansing of the cervix and vagina with Betadine solution,
a hysterosalpingogram was performed using a 5-French
hysterosalpingogram catheter and Omnipaque 300 contrast. The patient
tolerated the examination without difficulty.

[Series 1: run · 1 of 1 slices shown (1 of 8)]
[im 1/1]
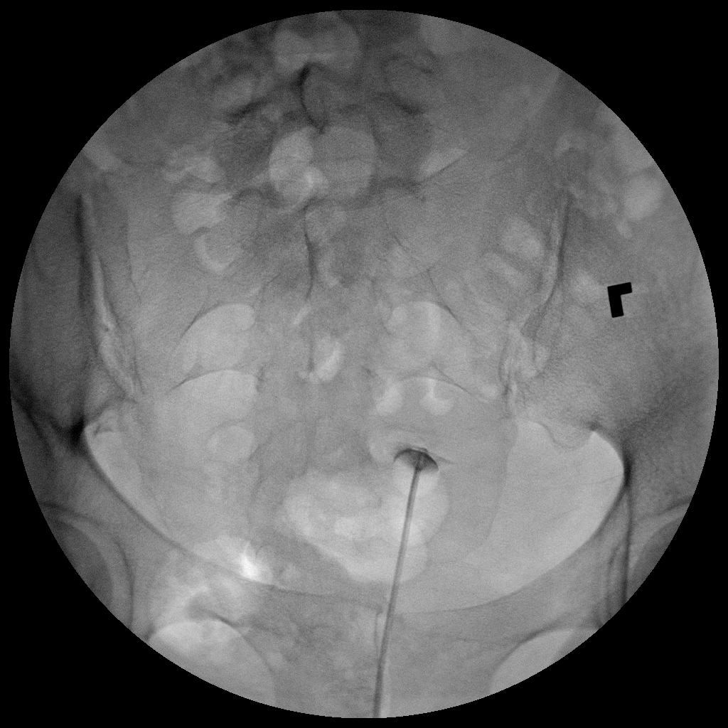

[Series 2: run · 1 of 1 slices shown (2 of 8)]
[im 1/1]
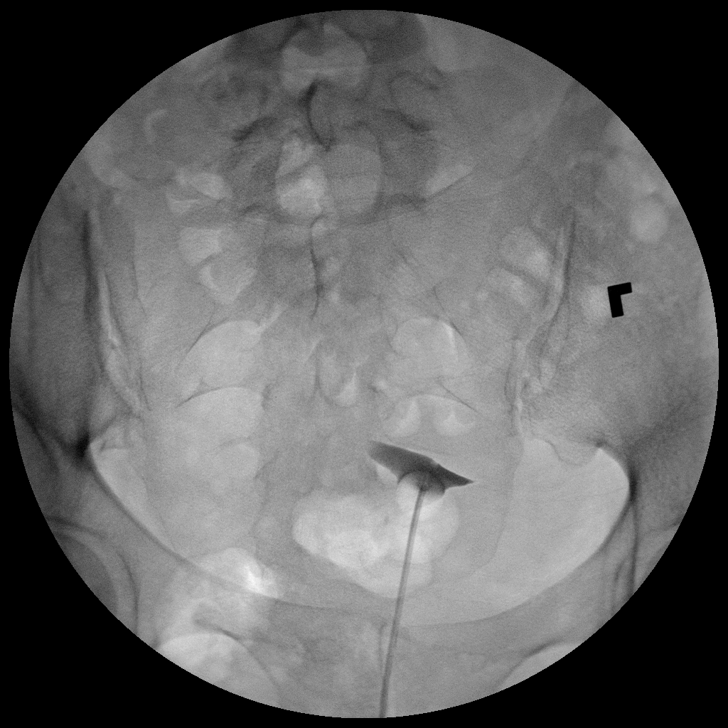

[Series 3: run · 1 of 1 slices shown (3 of 8)]
[im 1/1]
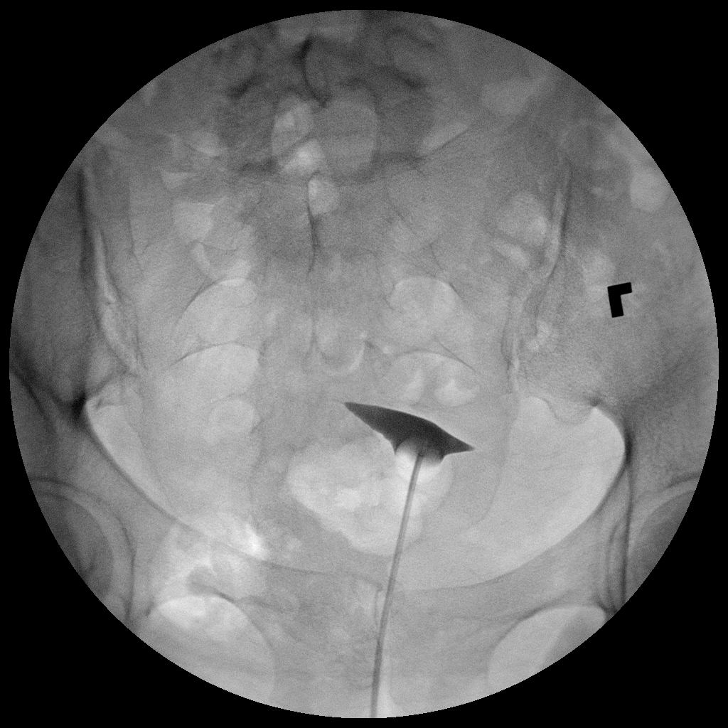

[Series 4: run · 1 of 1 slices shown (4 of 8)]
[im 1/1]
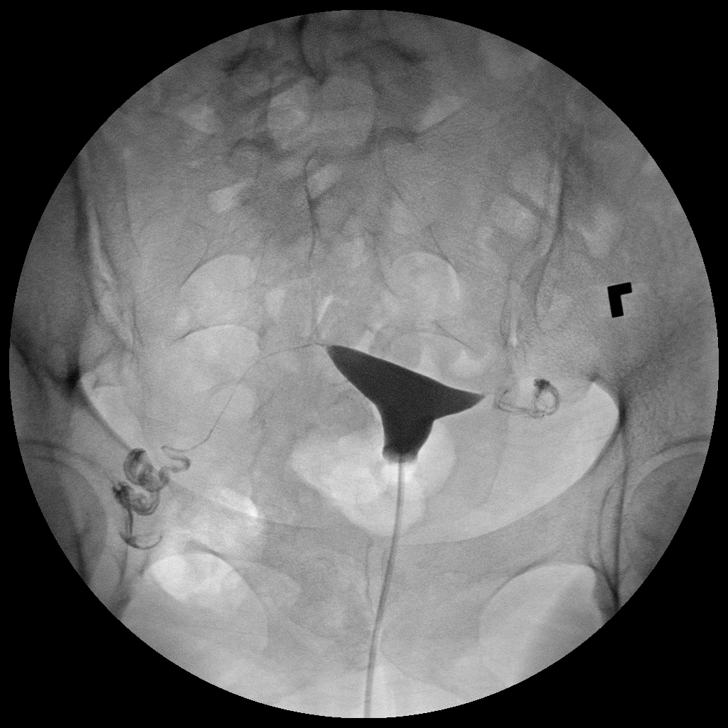

[Series 5: run · 1 of 1 slices shown (5 of 8)]
[im 1/1]
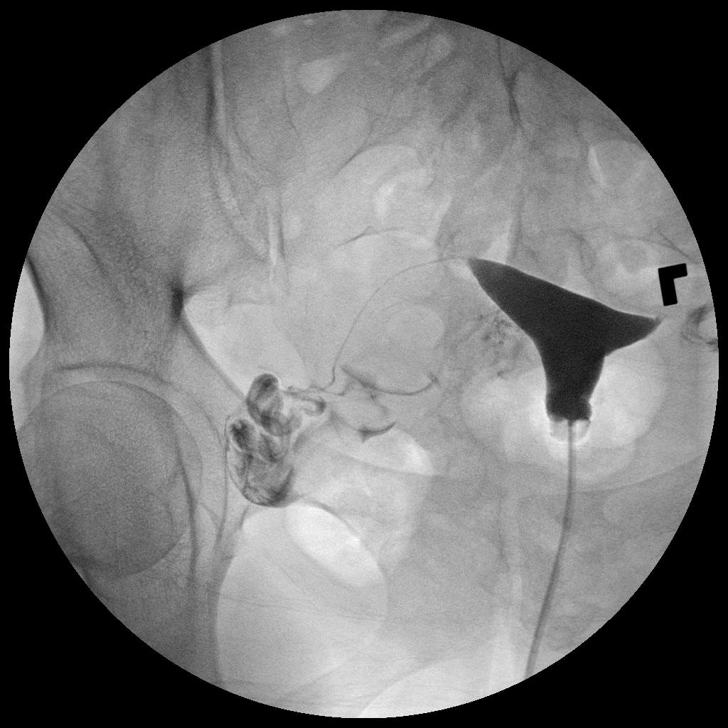

[Series 6: run · 1 of 1 slices shown (6 of 8)]
[im 1/1]
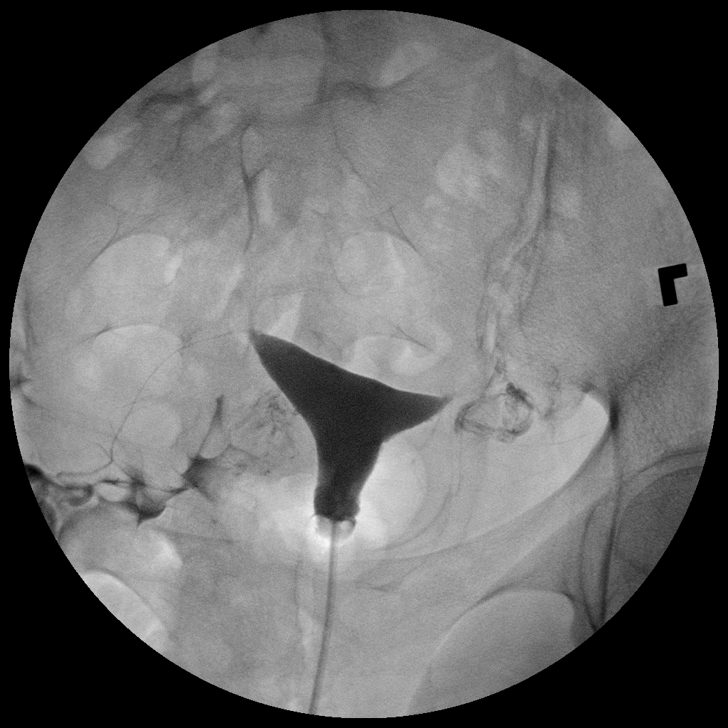

[Series 7: run · 1 of 1 slices shown (7 of 8)]
[im 1/1]
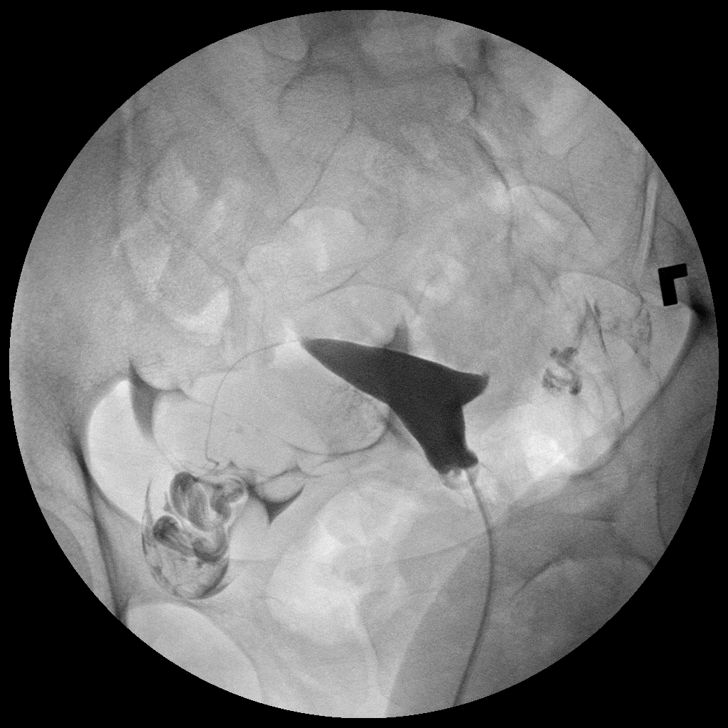

[Series 8: run · 1 of 1 slices shown (8 of 8)]
[im 1/1]
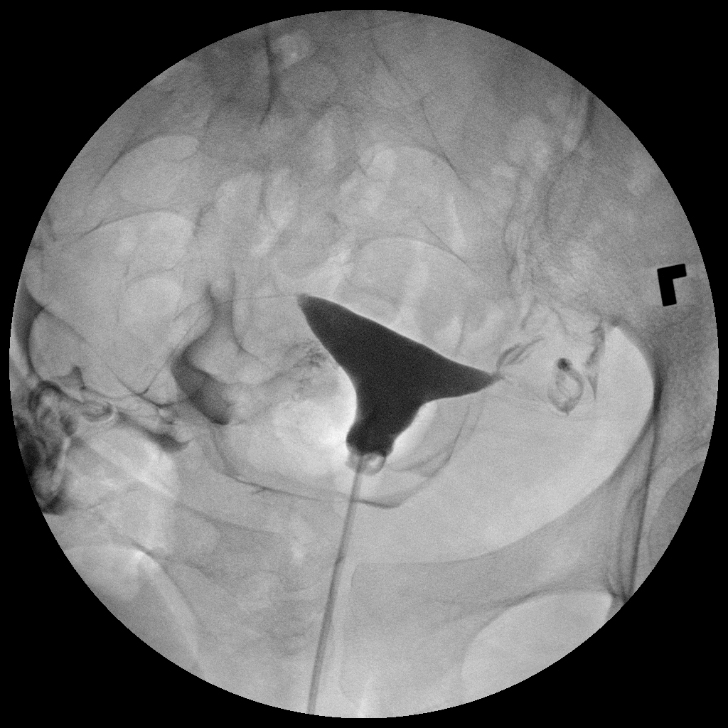

[8 of 8 positions shown; findings below may reference images not displayed]

FINDINGS: Normal early filling of the uterus.

Normal early filling with spill in the right fallopian tube.

Normal early filling with slight (but patent) spill in the left
fallopian tube.
IMPRESSION: Normal HSG.

## 2015-09-07 ENCOUNTER — Other Ambulatory Visit (HOSPITAL_COMMUNITY): Payer: 59

## 2015-09-08 ENCOUNTER — Other Ambulatory Visit (HOSPITAL_COMMUNITY): Payer: 59

## 2015-09-09 ENCOUNTER — Other Ambulatory Visit (HOSPITAL_COMMUNITY): Payer: 59

## 2015-09-12 ENCOUNTER — Other Ambulatory Visit (HOSPITAL_COMMUNITY): Payer: 59

## 2015-09-13 ENCOUNTER — Other Ambulatory Visit (HOSPITAL_COMMUNITY): Payer: 59

## 2015-09-14 ENCOUNTER — Other Ambulatory Visit (HOSPITAL_COMMUNITY): Payer: 59

## 2015-09-15 ENCOUNTER — Other Ambulatory Visit (HOSPITAL_COMMUNITY): Payer: 59

## 2015-09-16 ENCOUNTER — Other Ambulatory Visit (HOSPITAL_COMMUNITY): Payer: 59

## 2015-09-19 ENCOUNTER — Other Ambulatory Visit (HOSPITAL_COMMUNITY): Payer: 59

## 2015-09-20 ENCOUNTER — Other Ambulatory Visit (HOSPITAL_COMMUNITY): Payer: 59

## 2015-09-21 ENCOUNTER — Other Ambulatory Visit (HOSPITAL_COMMUNITY): Payer: 59

## 2015-10-03 MED FILL — ESCITALOPRAM 20 MG TABLET: 20 | 30 days supply | Qty: 30 | Fill #2

## 2015-10-04 ENCOUNTER — Ambulatory Visit (HOSPITAL_COMMUNITY): Payer: Self-pay | Admitting: Psychiatry

## 2015-11-02 MED FILL — GABAPENTIN 300 MG CAPSULE: 300 | 30 days supply | Qty: 120 | Fill #1

## 2015-11-02 MED FILL — ESCITALOPRAM 20 MG TABLET: 20 | 30 days supply | Qty: 30 | Fill #0

## 2015-11-08 ENCOUNTER — Encounter (HOSPITAL_COMMUNITY): Payer: Self-pay | Admitting: Psychiatry

## 2015-11-08 ENCOUNTER — Ambulatory Visit (INDEPENDENT_AMBULATORY_CARE_PROVIDER_SITE_OTHER): Payer: 59 | Admitting: Psychiatry

## 2015-11-08 VITALS — BP 122/78 | HR 79 | Ht 65.5 in | Wt 150.8 lb

## 2015-11-08 DIAGNOSIS — F1010 Alcohol abuse, uncomplicated: Secondary | ICD-10-CM

## 2015-11-08 DIAGNOSIS — F332 Major depressive disorder, recurrent severe without psychotic features: Secondary | ICD-10-CM

## 2015-11-08 DIAGNOSIS — F411 Generalized anxiety disorder: Secondary | ICD-10-CM

## 2015-11-08 DIAGNOSIS — G4700 Insomnia, unspecified: Secondary | ICD-10-CM

## 2015-11-08 DIAGNOSIS — F1510 Other stimulant abuse, uncomplicated: Secondary | ICD-10-CM

## 2015-11-08 DIAGNOSIS — F1210 Cannabis abuse, uncomplicated: Secondary | ICD-10-CM

## 2015-11-08 DIAGNOSIS — F4310 Post-traumatic stress disorder, unspecified: Secondary | ICD-10-CM

## 2015-11-08 DIAGNOSIS — F1610 Hallucinogen abuse, uncomplicated: Secondary | ICD-10-CM

## 2015-11-08 MED ORDER — TRAZODONE HCL 50 MG PO TABS: 50.0000 mg | ORAL_TABLET | Freq: Every evening | ORAL | Status: DC | PRN

## 2015-11-08 MED ORDER — ESCITALOPRAM OXALATE 20 MG PO TABS: 20.0000 mg | ORAL_TABLET | Freq: Every day | ORAL | Status: DC

## 2015-11-08 MED ORDER — GABAPENTIN 400 MG PO CAPS: 400.0000 mg | ORAL_CAPSULE | Freq: Four times a day (QID) | ORAL | Status: DC
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
## 2015-11-14 ENCOUNTER — Encounter (HOSPITAL_COMMUNITY): Payer: Self-pay

## 2015-11-14 ENCOUNTER — Encounter (HOSPITAL_COMMUNITY): Payer: Self-pay | Admitting: *Deleted

## 2015-11-14 ENCOUNTER — Inpatient Hospital Stay (HOSPITAL_COMMUNITY)
Admission: AD | Admit: 2015-11-14 | Discharge: 2015-11-17 | DRG: 882 | Disposition: A | Discharge status: Home / Self Care | Payer: 59 | Source: Intra-hospital | Attending: Psychiatry | Admitting: Psychiatry

## 2015-11-14 ENCOUNTER — Emergency Department (HOSPITAL_COMMUNITY)
Admission: EM | Admit: 2015-11-14 | Discharge: 2015-11-14 | Disposition: A | Discharge status: Other Acute Inpatient Hospital | Payer: 59 | Attending: Emergency Medicine | Admitting: Emergency Medicine

## 2015-11-14 DIAGNOSIS — F4312 Post-traumatic stress disorder, chronic: Principal | ICD-10-CM | POA: Diagnosis present

## 2015-11-14 DIAGNOSIS — F332 Major depressive disorder, recurrent severe without psychotic features: Secondary | ICD-10-CM | POA: Diagnosis present

## 2015-11-14 DIAGNOSIS — Z62810 Personal history of physical and sexual abuse in childhood: Secondary | ICD-10-CM | POA: Diagnosis present

## 2015-11-14 DIAGNOSIS — F419 Anxiety disorder, unspecified: Secondary | ICD-10-CM | POA: Diagnosis not present

## 2015-11-14 DIAGNOSIS — Z915 Personal history of self-harm: Secondary | ICD-10-CM

## 2015-11-14 DIAGNOSIS — Z818 Family history of other mental and behavioral disorders: Secondary | ICD-10-CM

## 2015-11-14 DIAGNOSIS — G4700 Insomnia, unspecified: Secondary | ICD-10-CM | POA: Diagnosis not present

## 2015-11-14 DIAGNOSIS — F411 Generalized anxiety disorder: Secondary | ICD-10-CM | POA: Diagnosis present

## 2015-11-14 DIAGNOSIS — Z79899 Other long term (current) drug therapy: Secondary | ICD-10-CM

## 2015-11-14 DIAGNOSIS — F17210 Nicotine dependence, cigarettes, uncomplicated: Secondary | ICD-10-CM | POA: Insufficient documentation

## 2015-11-14 DIAGNOSIS — Z3202 Encounter for pregnancy test, result negative: Secondary | ICD-10-CM | POA: Insufficient documentation

## 2015-11-14 DIAGNOSIS — R45851 Suicidal ideations: Secondary | ICD-10-CM

## 2015-11-14 DIAGNOSIS — Z811 Family history of alcohol abuse and dependence: Secondary | ICD-10-CM | POA: Diagnosis not present

## 2015-11-14 HISTORY — DX: Poisoning by unspecified drugs, medicaments and biological substances, intentional self-harm, initial encounter: T50.902A

## 2015-11-14 HISTORY — DX: Suicidal ideations: R45.851

## 2015-11-14 HISTORY — DX: Insomnia, unspecified: G47.00

## 2015-11-14 HISTORY — DX: Other psychoactive substance dependence, uncomplicated: F19.20

## 2015-11-14 HISTORY — DX: Child sexual abuse, confirmed, initial encounter: T74.22XA

## 2015-11-14 LAB — COMPREHENSIVE METABOLIC PANEL
ALBUMIN: 4.1 g/dL (ref 3.5–5.0)
ALT: 16 U/L (ref 14–54)
ANION GAP: 7 (ref 5–15)
AST: 22 U/L (ref 15–41)
Alkaline Phosphatase: 49 U/L (ref 38–126)
BUN: 12 mg/dL (ref 6–20)
CHLORIDE: 104 mmol/L (ref 101–111)
CO2: 26 mmol/L (ref 22–32)
CREATININE: 0.83 mg/dL (ref 0.44–1.00)
Calcium: 8.8 mg/dL — ABNORMAL LOW (ref 8.9–10.3)
GFR calc non Af Amer: >60 mL/min (ref >60)
Glucose, Bld: 82 mg/dL (ref 65–99)
Potassium: 4.1 mmol/L (ref 3.5–5.1)
SODIUM: 137 mmol/L (ref 135–145)
Total Bilirubin: 0.7 mg/dL (ref 0.3–1.2)
Total Protein: 7.6 g/dL (ref 6.5–8.1)

## 2015-11-14 LAB — RAPID URINE DRUG SCREEN, HOSP PERFORMED
AMPHETAMINES: NOT DETECTED
BENZODIAZEPINES: NOT DETECTED
Barbiturates: NOT DETECTED
COCAINE: NOT DETECTED
OPIATES: NOT DETECTED
TETRAHYDROCANNABINOL: NOT DETECTED

## 2015-11-14 LAB — I-STAT BETA HCG BLOOD, ED (MC, WL, AP ONLY): I-stat hCG, quantitative: <5 m[IU]/mL (ref <5)

## 2015-11-14 LAB — SALICYLATE LEVEL

## 2015-11-14 LAB — CBC
HCT: 42.9 % (ref 36.0–46.0)
HEMOGLOBIN: 13.7 g/dL (ref 12.0–15.0)
MCH: 28.3 pg (ref 26.0–34.0)
MCHC: 31.9 g/dL (ref 30.0–36.0)
MCV: 88.6 fL (ref 78.0–100.0)
Platelets: 225 10*3/uL (ref 150–400)
RBC: 4.84 MIL/uL (ref 3.87–5.11)
RDW: 14 % (ref 11.5–15.5)
WBC: 3.8 10*3/uL — ABNORMAL LOW (ref 4.0–10.5)

## 2015-11-14 LAB — ACETAMINOPHEN LEVEL

## 2015-11-14 LAB — POC URINE PREG, ED: Preg Test, Ur: NEGATIVE

## 2015-11-14 LAB — ETHANOL: Alcohol, Ethyl (B): <5 mg/dL (ref <5)

## 2015-11-14 MED ORDER — ZOLPIDEM TARTRATE 5 MG PO TABS: 5.0000 mg | ORAL_TABLET | Freq: Every evening | ORAL | Status: DC | PRN

## 2015-11-14 MED ORDER — LORAZEPAM 1 MG PO TABS
0.0000 mg | ORAL_TABLET | Freq: Four times a day (QID) | ORAL | Status: DC
  Administered 2015-11-14 – 2015-11-15 (×2): 1 mg via ORAL
  Filled 2015-11-14 (×2): qty 1

## 2015-11-14 MED ORDER — ALUM & MAG HYDROXIDE-SIMETH 200-200-20 MG/5ML PO SUSP: 30.0000 mL | ORAL | Status: DC | PRN

## 2015-11-14 MED ORDER — GABAPENTIN 400 MG PO CAPS
400.0000 mg | ORAL_CAPSULE | Freq: Four times a day (QID) | ORAL | Status: DC
  Administered 2015-11-14 – 2015-11-17 (×11): 400 mg via ORAL
  Filled 2015-11-14 (×18): qty 1

## 2015-11-14 MED ORDER — VITAMIN B-1 100 MG PO TABS
100.0000 mg | ORAL_TABLET | Freq: Every day | ORAL | Status: DC
Start: 2015-11-14 — End: 2015-11-14
  Administered 2015-11-14: 100 mg via ORAL
  Filled 2015-11-14: qty 1

## 2015-11-14 MED ORDER — THIAMINE HCL 100 MG/ML IJ SOLN: 100.0000 mg | Freq: Every day | INTRAMUSCULAR | Status: DC

## 2015-11-14 MED ORDER — IBUPROFEN 600 MG PO TABS
600.0000 mg | ORAL_TABLET | Freq: Three times a day (TID) | ORAL | Status: DC | PRN
Start: 2015-11-14 — End: 2015-11-17
  Administered 2015-11-15: 600 mg via ORAL
  Filled 2015-11-14: qty 1

## 2015-11-14 MED ORDER — ONDANSETRON HCL 4 MG PO TABS: 4.0000 mg | ORAL_TABLET | Freq: Three times a day (TID) | ORAL | Status: DC | PRN

## 2015-11-14 MED ORDER — VITAMIN B-1 100 MG PO TABS
100.0000 mg | ORAL_TABLET | Freq: Every day | ORAL | Status: DC
  Administered 2015-11-15 – 2015-11-17 (×3): 100 mg via ORAL
  Filled 2015-11-14 (×4): qty 1

## 2015-11-14 MED ORDER — ESCITALOPRAM OXALATE 10 MG PO TABS
20.0000 mg | ORAL_TABLET | Freq: Every day | ORAL | Status: DC
  Filled 2015-11-14: qty 2

## 2015-11-14 MED ORDER — LORAZEPAM 1 MG PO TABS
1.0000 mg | ORAL_TABLET | Freq: Three times a day (TID) | ORAL | Status: DC | PRN
  Administered 2015-11-14 – 2015-11-15 (×2): 1 mg via ORAL
  Filled 2015-11-14 (×2): qty 1

## 2015-11-14 MED ORDER — ACETAMINOPHEN 325 MG PO TABS: 650.0000 mg | ORAL_TABLET | Freq: Four times a day (QID) | ORAL | Status: DC | PRN

## 2015-11-14 MED ORDER — ACETAMINOPHEN 325 MG PO TABS
650.0000 mg | ORAL_TABLET | ORAL | Status: DC | PRN
Start: 2015-11-14 — End: 2015-11-17
  Administered 2015-11-16: 650 mg via ORAL
  Filled 2015-11-14: qty 2

## 2015-11-14 MED ORDER — ACETAMINOPHEN 325 MG PO TABS: 650.0000 mg | ORAL_TABLET | ORAL | Status: DC | PRN

## 2015-11-14 MED ORDER — TRAZODONE HCL 50 MG PO TABS
50.0000 mg | ORAL_TABLET | Freq: Every evening | ORAL | Status: DC | PRN
  Administered 2015-11-14: 50 mg via ORAL
  Filled 2015-11-14: qty 1

## 2015-11-14 MED ORDER — LORAZEPAM 1 MG PO TABS: 0.0000 mg | ORAL_TABLET | Freq: Two times a day (BID) | ORAL | Status: DC

## 2015-11-14 MED ORDER — TRAZODONE HCL 50 MG PO TABS: 50.0000 mg | ORAL_TABLET | Freq: Every evening | ORAL | Status: DC | PRN

## 2015-11-14 MED ORDER — NICOTINE 21 MG/24HR TD PT24
21.0000 mg | MEDICATED_PATCH | Freq: Every day | TRANSDERMAL | Status: DC
  Administered 2015-11-15 – 2015-11-17 (×3): 21 mg via TRANSDERMAL
  Filled 2015-11-14 (×6): qty 1

## 2015-11-14 MED ORDER — LORAZEPAM 1 MG PO TABS: 1.0000 mg | ORAL_TABLET | Freq: Three times a day (TID) | ORAL | Status: DC | PRN

## 2015-11-14 MED ORDER — ESCITALOPRAM OXALATE 20 MG PO TABS
20.0000 mg | ORAL_TABLET | Freq: Every day | ORAL | Status: DC
  Administered 2015-11-15 – 2015-11-17 (×3): 20 mg via ORAL
  Filled 2015-11-14 (×4): qty 1
  Filled 2015-11-14: qty 2

## 2015-11-14 MED ORDER — MAGNESIUM HYDROXIDE 400 MG/5ML PO SUSP: 30.0000 mL | Freq: Every day | ORAL | Status: DC | PRN

## 2015-11-14 MED ORDER — GABAPENTIN 400 MG PO CAPS
400.0000 mg | ORAL_CAPSULE | Freq: Four times a day (QID) | ORAL | Status: DC
  Administered 2015-11-14: 400 mg via ORAL
  Filled 2015-11-14: qty 1

## 2015-11-14 MED ORDER — IBUPROFEN 200 MG PO TABS
600.0000 mg | ORAL_TABLET | Freq: Three times a day (TID) | ORAL | Status: DC | PRN
  Administered 2015-11-14: 600 mg via ORAL
  Filled 2015-11-14: qty 3

## 2015-11-14 MED ORDER — ALUM & MAG HYDROXIDE-SIMETH 200-200-20 MG/5ML PO SUSP
30.0000 mL | ORAL | Status: DC | PRN
Start: 2015-11-14 — End: 2015-11-17

## 2015-11-14 MED ORDER — LORAZEPAM 1 MG PO TABS
0.0000 mg | ORAL_TABLET | Freq: Four times a day (QID) | ORAL | Status: DC
  Administered 2015-11-14: 1 mg via ORAL
  Filled 2015-11-14: qty 1
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
## 2015-11-14 NOTE — Progress Notes (Signed)
Adult Psychoeducational Group Note  Date:  11/14/2015 Time:  10:36 PM  Group Topic/Focus:  Wrap-Up Group:   The focus of this group is to help patients review their daily goal of treatment and discuss progress on daily workbooks.  Participation Level:  Active  Participation Quality:  Attentive  Affect:  Appropriate  Cognitive:  Appropriate  Insight: Appropriate  Engagement in Group:  Engaged  Modes of Intervention:  Discussion  Additional Comments:  Pt goal for tonight is to get some sleep.   Amber Craig 11/14/2015, 10:36 PM
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
## 2015-11-14 NOTE — ED Notes (Signed)
[name] GIVEN BELONGINGS.

## 2015-11-14 NOTE — ED Notes (Signed)
Bed: WA07 Expected date:  Expected time:  Means of arrival:  Comments: SI
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
## 2015-11-14 NOTE — ED Notes (Signed)
ACT PRESENT

## 2015-11-14 NOTE — Tx Team (Signed)
Initial Interdisciplinary Treatment Plan   PATIENT STRESSORS: Substance abuse Traumatic event   PATIENT STRENGTHS: Ability for insight Average or above average intelligence Capable of independent living Communication skills General fund of knowledge Motivation for treatment/growth Physical Health Supportive family/friends Work skills   PROBLEM LIST: Problem List/Patient Goals Date to be addressed Date deferred Reason deferred Estimated date of resolution  Suicidal thoughts  11/14/15     anxiety 11/14/15                                                DISCHARGE CRITERIA:  Ability to meet basic life and health needs Adequate post-discharge living arrangements Improved stabilization in mood, thinking, and/or behavior Medical problems require only outpatient monitoring Motivation to continue treatment in a less acute level of care Need for constant or close observation no longer present Reduction of life-threatening or endangering symptoms to within safe limits Safe-care adequate arrangements made Verbal commitment to aftercare and medication compliance Withdrawal symptoms are absent or subacute and managed without 24-hour nursing intervention  PRELIMINARY DISCHARGE PLAN: Outpatient therapy Return to previous living arrangement Return to previous work or school arrangements  PATIENT/FAMIILY INVOLVEMENT: This treatment plan has been presented to and reviewed with the patient, Amber Craig, and/or family member,   The patient and family have been given the opportunity to ask questions and make suggestions.  [name] 11/14/2015, 6:58 PM
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
## 2015-11-14 NOTE — BH Assessment (Signed)
[facility] Assessment Progress Note Waylan Boga, DNP recommends IP treatement and Otila Kluver accepts pt to [phone]. Please call report to [phone].  Pt signed paperwork, which was faxed and pt can be transferred via [name].

## 2015-11-15 ENCOUNTER — Encounter (HOSPITAL_COMMUNITY): Payer: Self-pay | Admitting: Psychiatry

## 2015-11-15 DIAGNOSIS — F4312 Post-traumatic stress disorder, chronic: Principal | ICD-10-CM

## 2015-11-15 DIAGNOSIS — F332 Major depressive disorder, recurrent severe without psychotic features: Secondary | ICD-10-CM

## 2015-11-15 MED ORDER — MIRTAZAPINE 15 MG PO TABS
7.5000 mg | ORAL_TABLET | Freq: Every day | ORAL | Status: DC
  Administered 2015-11-15 – 2015-11-16 (×2): 7.5 mg via ORAL
  Filled 2015-11-15 (×2): qty 0.5
  Filled 2015-11-15: qty 1
  Filled 2015-11-15: qty 0.5

## 2015-11-15 MED ORDER — LORAZEPAM 0.5 MG PO TABS
0.5000 mg | ORAL_TABLET | Freq: Four times a day (QID) | ORAL | Status: DC | PRN
  Administered 2015-11-15 – 2015-11-16 (×3): 0.5 mg via ORAL
  Filled 2015-11-15 (×3): qty 1

## 2015-11-15 MED ORDER — TRAZODONE HCL 50 MG PO TABS
25.0000 mg | ORAL_TABLET | Freq: Every evening | ORAL | Status: DC | PRN
  Filled 2015-11-15: qty 1

## 2015-11-15 NOTE — Progress Notes (Signed)
Patient would like her midnight ativan changed to 2200.  Patient stated it would be OK to discontinue her 1800 ativan.

## 2015-11-15 NOTE — Plan of Care (Signed)
Problem: Alteration in mood & ability to function due to Goal: STG-Patient will attend groups Outcome: Progressing Pt attended evening group on 11/14/15

## 2015-11-15 NOTE — Progress Notes (Signed)
Recreation Therapy Notes  Animal-Assisted Activity (AAA) Program Checklist/Progress Notes Patient Eligibility Criteria Checklist & Daily Group note for Rec Tx Intervention  Date: 03.21.2017 Time: 2:45pm Location: [address] Dayroom    AAA/T Program Assumption of Risk Form signed by Patient/ or Parent Legal Guardian yes  Patient is free of allergies or sever asthma yes  Patient reports no fear of animals yes  Patient reports no history of cruelty to animals yes  Patient understands his/her participation is voluntary yes  Patient washes hands before animal contact yes  Patient washes hands after animal contact yes  Behavioral Response: Appropriate   Education: Hand Washing, Appropriate Animal Interaction   Education Outcome: Acknowledges education.   Clinical Observations/Feedback: Patient engaged appropriately with therapy dog and peers in session. Patient asked appropriate questions about therapy dog and his training.   Meshelle Holness L Dennard Vezina, LRT/CTRS         Dontray Haberland L 11/15/2015 3:59 PM 
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
## 2015-11-15 NOTE — [facility] Group Notes (Signed)
The focus of this group is to educate the patient on the purpose and policies of crisis stabilization and provide a format to answer questions about their admission.  The group details unit policies and expectations of patients while admitted.  Patient did not attend 0900 nurse education orientation group this morning.  Patient stayed in bed.   

## 2015-11-15 NOTE — Progress Notes (Signed)
D:  Patient's self inventory sheet, patient sleeps good, sleep medication is helpful.  Good appetite, low energy level, poor concentration.  Rated depression 9, hopeless 5, anxiety 10.  Withdrawals, cravings, agitation, irritability.  Denied SI.  Denied physical problems.  Denied pain.  Goal is to feel her pain.  Plans to be open in group.  No discharge plans. A:  Medications administered per MD orders.  Emotional support and encouragement given patient. R:  Denied SI and HI, contracts for safety.  Denied A/V hallucinations.  Safety maintained with 15 minute checks.
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
## 2015-11-15 NOTE — Tx Team (Signed)
Interdisciplinary Treatment Plan Update (Adult) Date: 11/15/2015   Date: 11/15/2015 8:20 PM  Progress in Treatment:  Attending groups: Yes  Participating in groups: Yes  Taking medication as prescribed: Yes  Tolerating medication: Yes  Family/Significant othe contact made: No, CSW attempting to make contact with husband Patient understands diagnosis: Yes AEB seeking help with depression and anxiety Discussing patient identified problems/goals with staff: Yes  Medical problems stabilized or resolved: Yes  Denies suicidal/homicidal ideation: Yes Patient has not harmed self or Others: Yes   New problem(s) identified: None identified at this time.   Discharge Plan or Barriers: Pt would like a referral to PHP. If this is not appropriate, she would like to continue seeing Dr. Joycelyn Das and [facility] and Dr. Hart Carwin at [facility] group for therapy. Pt will return home at discharge.  Additional comments:  Patient and CSW reviewed pt's identified goals and treatment plan. Patient verbalized understanding and agreed to treatment plan. CSW reviewed [facility] "Discharge Process and Patient Involvement" Form. Pt verbalized understanding of information provided and signed form.   Reason for Continuation of Hospitalization:  Anxiety Depression Medication stabilization Suicidal ideation Withdrawal symptoms  Estimated length of stay: 3-5 days  Review of initial/current patient goals per problem list:   1.  Goal(s): Patient will participate in aftercare plan  Met:  Yes  Target date: 3-5 days from date of admission   As evidenced by: Patient will participate within aftercare plan AEB aftercare provider and housing plan at discharge being identified.  11/15/15:  Pt would like a referral to [facility]. If this is not appropriate, she would like to continue seeing Dr. Joycelyn Das and [facility] and Dr. Hart Carwin at [facility] group for therapy. Pt will return home at discharge.  2.  Goal (s): Patient will exhibit  decreased depressive symptoms and suicidal ideations.  Met:  No  Target date: 3-5 days from date of admission   As evidenced by: Patient will utilize self rating of depression at 3 or below and demonstrate decreased signs of depression or be deemed stable for discharge by MD.  11/15/15: Pt rates depression at 9/10; denies SI  3.  Goal(s): Patient will demonstrate decreased signs and symptoms of anxiety.  Met:  No  Target date: 3-5 days from date of admission   As evidenced by: Patient will utilize self rating of anxiety at 3 or below and demonstrated decreased signs of anxiety, or be deemed stable for discharge by MD  11/15/15: Pt rates anxiety at 10/10 4.  Goal(s): Patient will demonstrate decreased signs of withdrawal due to substance abuse  Met:  Progressing  Target date: 3-5 days from date of admission   As evidenced by: Patient will produce a CIWA/COWS score of 0, have stable vitals signs, and no symptoms of withdrawal  11/15/15: Pt CIWA score of 1; endorses anxiety as symptom of withdrawal. No detox protocol initiated.  Attendees:  Patient:    Family:    Physician: Dr. Parke Poisson, MD  11/15/2015 8:20 PM  Nursing: Lars Pinks, RN Case manager  11/15/2015 8:20 PM  Clinical Social Worker Peri Maris, [place] 11/15/2015 8:20 PM  Other: Tilden Fossa, [employer] 11/15/2015 8:20 PM  Clinical: Grayland Ormond RN; Gaylan Gerold, RN 11/15/2015 8:20 PM  Other: , RN Charge Nurse 11/15/2015 8:20 PM  Other: Hilda Lias, [place], [place] Work [phone]

## 2015-11-15 NOTE — Plan of Care (Signed)
Problem: Consults Goal: Depression Patient Education See Patient Education Module for education specifics.  Outcome: Progressing Nurse discussed depression/coping skills with patient.        
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
## 2015-11-15 NOTE — [facility] Group Notes (Signed)
[place] LCSW Group Therapy 11/15/2015 1:15 PM  Type of Therapy: Group Therapy- Feelings about Diagnosis  Participation Level: Active   Participation Quality:  Appropriate  Affect:  Appropriate  Cognitive: Alert and Oriented   Insight:  Developing   Engagement in Therapy: Developing/Improving and Engaged   Modes of Intervention: Clarification, Confrontation, Discussion, Education, Exploration, Limit-setting, Orientation, Problem-solving, Rapport Building, Art therapist, Socialization and Support  Description of Group:   This group will allow patients to explore their thoughts and feelings about diagnoses they have received. Patients will be guided to explore their level of understanding and acceptance of these diagnoses. Facilitator will encourage patients to process their thoughts and feelings about the reactions of others to their diagnosis, and will guide patients in identifying ways to discuss their diagnosis with significant others in their lives. This group will be process-oriented, with patients participating in exploration of their own experiences as well as giving and receiving support and challenge from other group members.  Summary of Progress/Problems:  Pt was engaged in group discussion and was able to identify struggles that she faces due to her mental health diagnosis. She discussed how her feelings of not being worthy to feel better deter her from remaining consistent in treatment. Pt also described feeling resentful that recovery requires so much work to heal something she had no choice in developing (depression and PTSD).  Therapeutic Modalities:   Cognitive Behavioral Therapy Solution Focused Therapy Motivational Interviewing Relapse Prevention Therapy  Peri Maris, LCSWA 11/15/2015 8:58 PM

## 2015-11-16 ENCOUNTER — Other Ambulatory Visit: Payer: Self-pay

## 2015-11-16 NOTE — [facility] Group Notes (Signed)
[place] LCSW Group Therapy 11/16/2015 1:15 PM  Type of Therapy: Group Therapy- Emotion Regulation  Participation Level: Active  Participation Quality:  Appropriate  Affect: Appropriate  Cognitive: Alert and Oriented   Insight:  Developing/Improving  Engagement in Therapy: Developing/Improving and Engaged   Modes of Intervention: Clarification, Confrontation, Discussion, Education, Exploration, Limit-setting, Orientation, Problem-solving, Rapport Building, Art therapist, Socialization and Support  Summary of Progress/Problems: The topic for group today was emotional regulation. This group focused on both positive and negative emotion identification and allowed group members to process ways to identify feelings, regulate negative emotions, and find healthy ways to manage internal/external emotions. Group members were asked to reflect on a time when their reaction to an emotion led to a negative outcome and explored how alternative responses using emotion regulation would have benefited them. Group members were also asked to discuss a time when emotion regulation was utilized when a negative emotion was experienced. Pt discussed how she has a difficult time in recovery as she does not want to experience the negative emotions that come with dealing with past trauma. She became tearful in group but was receptive to encouragement from peers. She offered support and encouragement to others as well as personal examples and explanations related to coping skills.   Peri Maris, [place] 11/16/2015 6:46 PM

## 2015-11-16 NOTE — Progress Notes (Signed)
D: Pt presents with blunt affect and anxious mood on initial contact but brightened up as the shift progressed. Pt denies SI, HI and AVH at time of assessment. Pt reported good sleep and good appetite. Pt rated her depression 6/10, hopelessness 7/10 and anxiety 5/10. Pt's goal for today "Letting my creations out" which she plans to do by being "open in groups". Pt observed in scheduled groups. Compliant with medications when offered.  A: A: Support, availability and encouragement provided to pt. Scheduled and PRN Medications for c/o anxiety and pain (Ativan & Tylenol) administered with verbal education as per [facility] . Q 15 minutes checks maintained for safety on and off unit.  R: Pt receptive to care and cooperative with unit routines. Safety maintained on and off unit. POC continues.

## 2015-11-16 NOTE — Plan of Care (Signed)
Problem: Diagnosis: Increased Risk For Suicide Attempt Goal: STG-Patient Will Attend All Groups On The Unit Outcome: Progressing Pt attended scheduled groups and was verbally interactive during session.   Problem: Alteration in mood & ability to function due to Goal: STG-Patient will report withdrawal symptoms Outcome: Progressing Pt reported anxiety related to withdrawal symptoms. Medicated for anxiety as per [facility].
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
## 2015-11-16 NOTE — Progress Notes (Signed)
D: Patient denies SI/HI or AVH.  Met with patient in the day room where she was jovial and pleasant while interacting with others.  She states that she had a good day and that she had a lot of good conversation.  Pt. Did attend and actively participated in evening group.  A: Patient given emotional support from RN. Patient encouraged to come to staff with concerns and/or questions. Patient's medication routine continued. Patient's orders and plan of care reviewed.   R: Patient remains appropriate and cooperative. Will continue to monitor patient q15 minutes for safety.

## 2015-11-16 NOTE — Progress Notes (Signed)
Adult Psychoeducational Group Note  Date:  11/16/2015 Time:  10:12 PM  Group Topic/Focus:  Wrap-Up Group:   The focus of this group is to help patients review their daily goal of treatment and discuss progress on daily workbooks.  Participation Level:  Active  Participation Quality:  Appropriate  Affect:  Appropriate  Cognitive:  Alert  Insight: Appropriate  Engagement in Group:  Engaged  Modes of Intervention:  Discussion  Additional Comments:  Patient stated having a very emotional day, but after talking with other patient's she feel a lot better. On a scale between 1-10, (1=worst, 10=best) patient rated her day a 10.  Amber Craig 11/16/2015, 10:12 PM

## 2015-11-16 NOTE — Progress Notes (Signed)
D: Pt has anxious affect and mood.  Reports her goal was to "have a good day" and that she accomplished her goal.  Pt reports having a good visit with her grandmother, aunt, and boyfriend tonight.  Pt denies SI/HI, denies hallucinations, denies pain.  Pt has been visible in milieu interacting with peers and staff appropriately.     A: Actively listened to pt and offered support and encouragement.  Heat packs provided for shoulder discomfort.  Medications administered per order.  PRN medication administered for anxiety. R: Pt is compliant with medications.  Pt verbally contracts for safety.  Will continue to monitor and assess.

## 2015-11-16 NOTE — BHH Suicide Risk Assessment (Signed)
Amber Craig INPATIENT:  Family/Significant Other Suicide Prevention Education  Suicide Prevention Education:  Education Completed; Amber Craig, Pt's boyfriend [phone],  has been identified by the patient as the family member/significant other with whom the patient will be residing, and identified as the person(s) who will aid the patient in the event of a mental health crisis (suicidal ideations/suicide attempt).  With written consent from the patient, the family member/significant other has been provided the following suicide prevention education, prior to the and/or following the discharge of the patient.  The suicide prevention education provided includes the following:  Suicide risk factors  Suicide prevention and interventions  National Suicide Hotline telephone number  [facility] assessment telephone number  [facility] Emergency Assistance [place] and/or Residential Mobile Crisis Unit telephone number  Request made of family/significant other to:  Remove weapons (e.g., guns, rifles, knives), all items previously/currently identified as safety concern.    Remove drugs/medications (over-the-counter, prescriptions, illicit drugs), all items previously/currently identified as a safety concern.  The family member/significant other verbalizes understanding of the suicide prevention education information provided.  The family member/significant other agrees to remove the items of safety concern listed above.  Amber Craig 11/16/2015, 11:24 AM

## 2015-11-16 NOTE — Plan of Care (Signed)
Problem: Diagnosis: Increased Risk For Suicide Attempt Goal: STG-Patient Will Comply With Medication Regime Outcome: Progressing Pt has been compliant with medications tonight.       

## 2015-11-17 MED ORDER — MIRTAZAPINE 7.5 MG PO TABS: 7.5000 mg | ORAL_TABLET | Freq: Every day | ORAL | Status: DC

## 2015-11-17 MED ORDER — TRAZODONE HCL 50 MG PO TABS: 25.0000 mg | ORAL_TABLET | Freq: Every evening | ORAL | Status: DC | PRN

## 2015-11-17 MED ORDER — ESCITALOPRAM OXALATE 20 MG PO TABS: 20.0000 mg | ORAL_TABLET | Freq: Every day | ORAL | Status: DC

## 2015-11-17 MED ORDER — GABAPENTIN 400 MG PO CAPS: 400.0000 mg | ORAL_CAPSULE | Freq: Four times a day (QID) | ORAL | Status: DC

## 2015-11-17 MED ORDER — NICOTINE 21 MG/24HR TD PT24: 21.0000 mg | MEDICATED_PATCH | Freq: Every day | TRANSDERMAL | Status: DC

## 2015-11-17 MED FILL — GABAPENTIN 400 MG CAPSULE: 400 | 30 days supply | Qty: 120 | Fill #0

## 2015-11-17 MED FILL — traZODone HCL 50 MG TABS: 50 | 60 days supply | Qty: 30 | Fill #0

## 2015-11-17 NOTE — Progress Notes (Signed)
Discharge note: Patient discharged home per MD order.  Patient received all personal belongings from unit and locker.  She will follow up with her regular therapist at [facility].  She is confident that she will continue her treatment.  She denies SI/HI/AVH.  She was very appreciative of staff's assistance.  Reviewed all medications and prescriptions with patient and she indicated understanding.  She left ambulatory with security to get her car at [facility].

## 2015-11-17 NOTE — Progress Notes (Deleted)
  [facility] Adult Case Management Discharge Plan :  Will you be returning to the same living situation after discharge:  Yes,  Pt will return home At discharge, do you have transportation home?: Yes,  Pt husband to pick up Do you have the ability to pay for your medications: Yes,  Pt provided with prescriptions  Release of information consent forms completed and in the chart;  Patient's signature needed at discharge.  Patient to Follow up at: Follow-up Information    Follow up with CSW will call you with appointments for therapy.      Follow up with [facility] On 12/08/2015.   Specialty:  Behavioral Health   Why:  at 10:45am with Dr. Doyne Keel for medication management.    Contact information:   [place] [phone]      Next level of care provider has access to [facility] and Suicide Prevention discussed: Yes,  with husband; see SPE note for further detail  Have you used any form of tobacco in the last 30 days? (Cigarettes, Smokeless Tobacco, Cigars, and/or Pipes): Yes  Has patient been referred to the Quitline?: Patient refused referral  Patient has been referred for addiction treatment: [facility] 11/17/2015, 10:28 AM
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
## 2015-11-17 NOTE — Progress Notes (Signed)
  [facility] Adult Case Management Discharge Plan :  Will you be returning to the same living situation after discharge:  Yes,  Pt returning home At discharge, do you have transportation home?: Yes,  Pt husband to pick up Do you have the ability to pay for your medications: Yes,  Pt provided with prescriptions  Release of information consent forms completed and in the chart;  Patient's signature needed at discharge.  Patient to Follow up at: Follow-up Information    Follow up with [facility].   Why:  Dr. Hart Carwin will call you today with an appointment time with adjusted copay.   Contact information:   [address] [place], [place] [address] Phone: [phone] Fax: [phone]      Follow up with [facility] On 12/08/2015.   Specialty:  Behavioral Health   Why:  at 10:45am with Dr. Doyne Keel for medication management.    Contact information:   [place] [phone]      Next level of care provider has access to [place] and Suicide Prevention discussed: Yes,  with husband; see SPE note for further details  Have you used any form of tobacco in the last 30 days? (Cigarettes, Smokeless Tobacco, Cigars, and/or Pipes): Yes  Has patient been referred to the Quitline?: Patient refused referral  Patient has been referred for addiction treatment: Yes  Bo Mcclintock 11/17/2015, 2:12 PM

## 2015-11-17 NOTE — Tx Team (Signed)
Interdisciplinary Treatment Plan Update (Adult) Date: 11/17/2015   Date: 11/17/2015 10:24 AM  Progress in Treatment:  Attending groups: Yes  Participating in groups: Yes  Taking medication as prescribed: Yes  Tolerating medication: Yes  Family/Significant othe contact made: Yes with husband Patient understands diagnosis: Yes AEB seeking help with depression and anxiety Discussing patient identified problems/goals with staff: Yes  Medical problems stabilized or resolved: Yes  Denies suicidal/homicidal ideation: Yes Patient has not harmed self or Others: Yes   New problem(s) identified: None identified at this time.   Discharge Plan or Barriers: Pt would like a referral to PHP. If this is not appropriate, she would like to continue seeing Dr. Joycelyn Das and [facility] and Dr. Hart Carwin at [facility] group for therapy. Pt will return home at discharge.  11/17/15: Pt will discharge home and follow-up with Outpatient providers.  Additional comments:  Patient and CSW reviewed pt's identified goals and treatment plan. Patient verbalized understanding and agreed to treatment plan. CSW reviewed [facility] "Discharge Process and Patient Involvement" Form. Pt verbalized understanding of information provided and signed form.   Reason for Continuation of Hospitalization:  Anxiety Depression Medication stabilization Suicidal ideation Withdrawal symptoms  Estimated length of stay: 0 days  Review of initial/current patient goals per problem list:   1.  Goal(s): Patient will participate in aftercare plan  Met:  Yes  Target date: 3-5 days from date of admission   As evidenced by: Patient will participate within aftercare plan AEB aftercare provider and housing plan at discharge being identified.  11/15/15:  Pt would like a referral to [place]. If this is not appropriate, she would like to continue seeing Dr. Joycelyn Das and [facility] and Dr. Hart Carwin at [facility] group for therapy. Pt will return home at  discharge. 11/17/2015: Pt will discharge home and follow-up with Outpatient providers.  2.  Goal (s): Patient will exhibit decreased depressive symptoms and suicidal ideations.  Met:  Yes  Target date: 3-5 days from date of admission   As evidenced by: Patient will utilize self rating of depression at 3 or below and demonstrate decreased signs of depression or be deemed stable for discharge by MD.  11/15/15: Pt rates depression at 9/10; denies SI  11/17/15: Pt rates depression 3/10; denies SI  3.  Goal(s): Patient will demonstrate decreased signs and symptoms of anxiety.  Met:  Adequate for DC  Target date: 3-5 days from date of admission   As evidenced by: Patient will utilize self rating of anxiety at 3 or below and demonstrated decreased signs of anxiety, or be deemed stable for discharge by MD  11/15/15: Pt rates anxiety at 10/10  11/17/15: MD feels that Pt's symptoms have decreased to the point that they can be managed in an outpatient setting.  4.  Goal(s): Patient will demonstrate decreased signs of withdrawal due to substance abuse  Met:  Yes  Target date: 3-5 days from date of admission   As evidenced by: Patient will produce a CIWA/COWS score of 0, have stable vitals signs, and no symptoms of withdrawal  11/15/15: Pt CIWA score of 1; endorses anxiety as symptom of withdrawal. No detox protocol initiated. 11/17/2015: Pt reports no symptoms of withdrawal.  Attendees:  Patient:    Family:    Physician: Dr. Parke Poisson, MD  11/17/2015 10:24 AM  Nursing:  11/17/2015 10:24 AM  Clinical Social Worker Peri Maris, [place] 11/17/2015 10:24 AM  Other: Erasmo Downer Drinkard, [place] 11/17/2015 10:24 AM  Clinical: Grayland Ormond RN; Mayra Neer RN 11/17/2015  10:24 AM  Other: , RN Charge Nurse 11/17/2015 10:24 AM  Other:     Peri Maris, [place] Work [phone]

## 2015-11-17 NOTE — [facility] Group Notes (Signed)
LATE ENTRY FROM 11/16/15:     [facility] LCSW Aftercare Discharge Planning Group Note  11/16/2015  8:45 AM   Participation Quality: Alert, Appropriate and Oriented  Mood/Affect: Blunted  Depression Rating: 5  Anxiety Rating: 6  Thoughts of Suicide: Pt denies SI/HI  Will you contract for safety? Yes  Current AVH: Pt denies  Plan for Discharge/Comments: Pt attended discharge planning group and actively participated in group. CSW provided pt with today's workbook. Patient plans to return home to follow up with outpatient services.   Transportation Means: Pt reports access to transportation  Supports: No supports mentioned at this time  Tilden Fossa, MSW, [employer] Clinical Social Worker [employer] [phone]
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
## 2015-11-29 MED FILL — MIRTAZAPINE 15 MG TABLET: 15 | 30 days supply | Qty: 15 | Fill #0

## 2015-12-06 MED FILL — ESCITALOPRAM 20 MG TABLET: 20 | 30 days supply | Qty: 30 | Fill #0

## 2015-12-08 ENCOUNTER — Ambulatory Visit (HOSPITAL_COMMUNITY): Payer: Self-pay | Admitting: Psychiatry

## 2015-12-22 ENCOUNTER — Ambulatory Visit (HOSPITAL_COMMUNITY): Payer: Self-pay | Admitting: Psychiatry

## 2015-12-28 MED FILL — GABAPENTIN 400 MG CAPSULE: 400 | 30 days supply | Qty: 120 | Fill #1

## 2015-12-29 MED FILL — ESCITALOPRAM 20 MG TABLET: 20 | 30 days supply | Qty: 30 | Fill #1

## 2016-01-12 ENCOUNTER — Encounter (HOSPITAL_COMMUNITY): Payer: Self-pay | Admitting: Psychiatry

## 2016-01-12 ENCOUNTER — Ambulatory Visit (INDEPENDENT_AMBULATORY_CARE_PROVIDER_SITE_OTHER): Payer: 59 | Admitting: Psychiatry

## 2016-01-12 VITALS — BP 102/70 | HR 92 | Ht 65.0 in | Wt 164.0 lb

## 2016-01-12 DIAGNOSIS — F1010 Alcohol abuse, uncomplicated: Secondary | ICD-10-CM

## 2016-01-12 DIAGNOSIS — F411 Generalized anxiety disorder: Secondary | ICD-10-CM | POA: Diagnosis not present

## 2016-01-12 DIAGNOSIS — F332 Major depressive disorder, recurrent severe without psychotic features: Secondary | ICD-10-CM | POA: Diagnosis not present

## 2016-01-12 DIAGNOSIS — F4310 Post-traumatic stress disorder, unspecified: Secondary | ICD-10-CM | POA: Diagnosis not present

## 2016-01-12 DIAGNOSIS — F1210 Cannabis abuse, uncomplicated: Secondary | ICD-10-CM

## 2016-01-12 MED ORDER — GABAPENTIN 400 MG PO CAPS: 400.0000 mg | ORAL_CAPSULE | Freq: Four times a day (QID) | ORAL | Status: DC

## 2016-01-12 MED ORDER — ESCITALOPRAM OXALATE 20 MG PO TABS: 20.0000 mg | ORAL_TABLET | Freq: Every day | ORAL | Status: DC

## 2016-01-12 MED ORDER — MIRTAZAPINE 15 MG PO TABS: 15.0000 mg | ORAL_TABLET | Freq: Every day | ORAL | Status: DC

## 2016-01-12 MED FILL — MIRTAZAPINE 15 MG TABLET: 15 | 30 days supply | Qty: 30 | Fill #0
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
## 2016-02-08 MED FILL — MIRTAZAPINE 15 MG TABLET: 15 | 30 days supply | Qty: 30 | Fill #1

## 2016-02-08 MED FILL — GABAPENTIN 400 MG CAPSULE: 400 | 40 days supply | Qty: 160 | Fill #0

## 2016-02-08 MED FILL — ESCITALOPRAM 20 MG TABLET: 20 | 30 days supply | Qty: 30 | Fill #0

## 2016-03-19 MED FILL — ESCITALOPRAM 20 MG TABLET: 20 | 30 days supply | Qty: 30 | Fill #1

## 2016-03-22 ENCOUNTER — Other Ambulatory Visit (HOSPITAL_COMMUNITY): Payer: Self-pay | Admitting: Psychiatry

## 2016-03-22 DIAGNOSIS — F411 Generalized anxiety disorder: Secondary | ICD-10-CM

## 2016-03-22 DIAGNOSIS — F332 Major depressive disorder, recurrent severe without psychotic features: Secondary | ICD-10-CM

## 2016-03-22 DIAGNOSIS — F4310 Post-traumatic stress disorder, unspecified: Secondary | ICD-10-CM

## 2016-04-06 ENCOUNTER — Other Ambulatory Visit (HOSPITAL_COMMUNITY): Payer: Self-pay | Admitting: Psychiatry

## 2016-04-06 DIAGNOSIS — F332 Major depressive disorder, recurrent severe without psychotic features: Secondary | ICD-10-CM

## 2016-04-06 DIAGNOSIS — F4310 Post-traumatic stress disorder, unspecified: Secondary | ICD-10-CM

## 2016-04-06 DIAGNOSIS — F411 Generalized anxiety disorder: Secondary | ICD-10-CM

## 2016-04-06 MED FILL — MIRTAZAPINE 15 MG TABLET: 15 | 15 days supply | Qty: 15 | Fill #0

## 2016-04-06 MED FILL — GABAPENTIN 400 MG CAPSULE: 400 | 40 days supply | Qty: 160 | Fill #1

## 2016-04-12 ENCOUNTER — Ambulatory Visit (INDEPENDENT_AMBULATORY_CARE_PROVIDER_SITE_OTHER): Payer: 59 | Admitting: Psychiatry

## 2016-04-12 ENCOUNTER — Encounter (HOSPITAL_COMMUNITY): Payer: Self-pay | Admitting: Psychiatry

## 2016-04-12 DIAGNOSIS — F411 Generalized anxiety disorder: Secondary | ICD-10-CM | POA: Diagnosis not present

## 2016-04-12 DIAGNOSIS — F4310 Post-traumatic stress disorder, unspecified: Secondary | ICD-10-CM | POA: Diagnosis not present

## 2016-04-12 DIAGNOSIS — F332 Major depressive disorder, recurrent severe without psychotic features: Secondary | ICD-10-CM | POA: Diagnosis not present

## 2016-04-12 DIAGNOSIS — F1010 Alcohol abuse, uncomplicated: Secondary | ICD-10-CM | POA: Diagnosis not present

## 2016-04-12 MED ORDER — GABAPENTIN 400 MG PO CAPS: 400.0000 mg | ORAL_CAPSULE | Freq: Four times a day (QID) | ORAL | 3 refills | Status: DC

## 2016-04-12 MED ORDER — ESCITALOPRAM OXALATE 20 MG PO TABS: 20.0000 mg | ORAL_TABLET | Freq: Every day | ORAL | 3 refills | Status: DC

## 2016-04-12 MED ORDER — MIRTAZAPINE 15 MG PO TABS
15.0000 mg | ORAL_TABLET | Freq: Every day | ORAL | 3 refills | Status: DC
Start: 2016-04-12 — End: 2016-07-17
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
## 2016-04-25 MED FILL — ESCITALOPRAM 20 MG TABLET: 20 | 30 days supply | Qty: 30 | Fill #0

## 2016-05-25 MED FILL — MIRTAZAPINE 15 MG TABLET: 15 | 30 days supply | Qty: 30 | Fill #0

## 2016-05-25 MED FILL — ESCITALOPRAM 20 MG TABLET: 20 | 30 days supply | Qty: 30 | Fill #1

## 2016-05-25 MED FILL — GABAPENTIN 400 MG CAPSULE: 400 | 30 days supply | Qty: 120 | Fill #0

## 2016-06-28 MED FILL — MIRTAZAPINE 15 MG TABLET: 15 | 30 days supply | Qty: 30 | Fill #1

## 2016-06-28 MED FILL — ESCITALOPRAM 20 MG TABLET: 20 | 30 days supply | Qty: 30 | Fill #2

## 2016-06-28 MED FILL — GABAPENTIN 400 MG CAPSULE: 400 | 30 days supply | Qty: 120 | Fill #1

## 2016-07-17 ENCOUNTER — Encounter (HOSPITAL_COMMUNITY): Payer: Self-pay | Admitting: Psychiatry

## 2016-07-17 ENCOUNTER — Ambulatory Visit (INDEPENDENT_AMBULATORY_CARE_PROVIDER_SITE_OTHER): Payer: 59 | Admitting: Psychiatry

## 2016-07-17 DIAGNOSIS — F4310 Post-traumatic stress disorder, unspecified: Secondary | ICD-10-CM | POA: Diagnosis not present

## 2016-07-17 DIAGNOSIS — F17210 Nicotine dependence, cigarettes, uncomplicated: Secondary | ICD-10-CM

## 2016-07-17 DIAGNOSIS — F332 Major depressive disorder, recurrent severe without psychotic features: Secondary | ICD-10-CM

## 2016-07-17 DIAGNOSIS — F411 Generalized anxiety disorder: Secondary | ICD-10-CM | POA: Diagnosis not present

## 2016-07-17 DIAGNOSIS — Z813 Family history of other psychoactive substance abuse and dependence: Secondary | ICD-10-CM

## 2016-07-17 DIAGNOSIS — F1010 Alcohol abuse, uncomplicated: Secondary | ICD-10-CM | POA: Diagnosis not present

## 2016-07-17 DIAGNOSIS — Z811 Family history of alcohol abuse and dependence: Secondary | ICD-10-CM

## 2016-07-17 MED ORDER — GABAPENTIN 400 MG PO CAPS: 400.0000 mg | ORAL_CAPSULE | Freq: Four times a day (QID) | ORAL | 3 refills | Status: DC

## 2016-07-17 MED ORDER — MIRTAZAPINE 30 MG PO TABS: 15.0000 mg | ORAL_TABLET | Freq: Every day | ORAL | 3 refills | Status: DC

## 2016-07-17 MED ORDER — ESCITALOPRAM OXALATE 20 MG PO TABS: 20.0000 mg | ORAL_TABLET | Freq: Every day | ORAL | 3 refills | Status: DC
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
## 2016-07-31 DIAGNOSIS — H5213 Myopia, bilateral: Secondary | ICD-10-CM | POA: Diagnosis not present

## 2016-08-07 MED FILL — GABAPENTIN 400 MG CAPSULE: 400 | 30 days supply | Qty: 120 | Fill #0

## 2016-08-07 MED FILL — MIRTAZAPINE 30 MG TABLET: 30 | 30 days supply | Qty: 30 | Fill #0

## 2016-08-07 MED FILL — ESCITALOPRAM 20 MG TABLET: 20 | 30 days supply | Qty: 30 | Fill #0

## 2016-09-04 MED FILL — GABAPENTIN 400 MG CAPSULE: 400 | 30 days supply | Qty: 120 | Fill #2

## 2016-09-04 MED FILL — ESCITALOPRAM 20 MG TABLET: 20 | 30 days supply | Qty: 30 | Fill #3

## 2016-09-04 MED FILL — MIRTAZAPINE 15 MG TABLET: 15 | 30 days supply | Qty: 30 | Fill #2

## 2016-09-29 DIAGNOSIS — J00 Acute nasopharyngitis [common cold]: Secondary | ICD-10-CM | POA: Diagnosis not present

## 2016-09-29 DIAGNOSIS — J111 Influenza due to unidentified influenza virus with other respiratory manifestations: Secondary | ICD-10-CM | POA: Diagnosis not present

## 2016-09-29 DIAGNOSIS — R110 Nausea: Secondary | ICD-10-CM | POA: Diagnosis not present

## 2016-10-16 ENCOUNTER — Encounter (HOSPITAL_COMMUNITY): Payer: Self-pay | Admitting: Psychiatry

## 2016-10-16 ENCOUNTER — Ambulatory Visit (INDEPENDENT_AMBULATORY_CARE_PROVIDER_SITE_OTHER): Payer: 59 | Admitting: Psychiatry

## 2016-10-16 DIAGNOSIS — Z811 Family history of alcohol abuse and dependence: Secondary | ICD-10-CM

## 2016-10-16 DIAGNOSIS — F17210 Nicotine dependence, cigarettes, uncomplicated: Secondary | ICD-10-CM

## 2016-10-16 DIAGNOSIS — F411 Generalized anxiety disorder: Secondary | ICD-10-CM

## 2016-10-16 DIAGNOSIS — F332 Major depressive disorder, recurrent severe without psychotic features: Secondary | ICD-10-CM

## 2016-10-16 DIAGNOSIS — F1290 Cannabis use, unspecified, uncomplicated: Secondary | ICD-10-CM

## 2016-10-16 DIAGNOSIS — F4310 Post-traumatic stress disorder, unspecified: Secondary | ICD-10-CM

## 2016-10-16 DIAGNOSIS — F1010 Alcohol abuse, uncomplicated: Secondary | ICD-10-CM | POA: Diagnosis not present

## 2016-10-16 DIAGNOSIS — Z813 Family history of other psychoactive substance abuse and dependence: Secondary | ICD-10-CM

## 2016-10-16 DIAGNOSIS — Z79899 Other long term (current) drug therapy: Secondary | ICD-10-CM

## 2016-10-16 MED ORDER — MIRTAZAPINE 30 MG PO TABS: 15.0000 mg | ORAL_TABLET | Freq: Every day | ORAL | 1 refills | Status: DC

## 2016-10-16 MED ORDER — GABAPENTIN 400 MG PO CAPS: 400.0000 mg | ORAL_CAPSULE | Freq: Four times a day (QID) | ORAL | 1 refills | Status: DC

## 2016-10-16 MED FILL — GABAPENTIN 400 MG CAPSULE: 400 | 30 days supply | Qty: 120 | Fill #0

## 2016-10-16 MED FILL — MIRTAZAPINE 30 MG TABLET: 30 | 60 days supply | Qty: 30 | Fill #0
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
## 2016-10-18 ENCOUNTER — Ambulatory Visit (HOSPITAL_COMMUNITY): Payer: Self-pay | Admitting: Psychiatry

## 2016-11-15 ENCOUNTER — Telehealth (HOSPITAL_COMMUNITY): Payer: Self-pay

## 2016-11-15 DIAGNOSIS — F332 Major depressive disorder, recurrent severe without psychotic features: Secondary | ICD-10-CM

## 2016-11-15 DIAGNOSIS — F1010 Alcohol abuse, uncomplicated: Secondary | ICD-10-CM

## 2016-11-15 DIAGNOSIS — F411 Generalized anxiety disorder: Secondary | ICD-10-CM

## 2016-11-15 DIAGNOSIS — F4310 Post-traumatic stress disorder, unspecified: Secondary | ICD-10-CM

## 2016-11-15 NOTE — Telephone Encounter (Signed)
Yes we can give a one time 90 day supply

## 2016-11-15 NOTE — Telephone Encounter (Signed)
Patient is moving and would like to know if she can get a 90 day supply of her medications to last until she can find a new psychiatrist and her insurance from the new job kicks in

## 2016-11-16 MED ORDER — ESCITALOPRAM OXALATE 20 MG PO TABS: 20.0000 mg | ORAL_TABLET | Freq: Every day | ORAL | 0 refills | Status: DC

## 2016-11-16 MED ORDER — GABAPENTIN 400 MG PO CAPS: 400.0000 mg | ORAL_CAPSULE | Freq: Four times a day (QID) | ORAL | 0 refills | Status: DC

## 2016-11-16 MED ORDER — MIRTAZAPINE 30 MG PO TABS: 15.0000 mg | ORAL_TABLET | Freq: Every day | ORAL | 0 refills | Status: DC

## 2016-11-16 MED FILL — ESCITALOPRAM 20 MG TABLET: 20 | 90 days supply | Qty: 90 | Fill #0

## 2016-11-16 MED FILL — GABAPENTIN 400 MG CAPSULE: 400 | 90 days supply | Qty: 360 | Fill #0

## 2016-11-16 MED FILL — MIRTAZAPINE 30 MG TABLET: 30 | 90 days supply | Qty: 45 | Fill #0

## 2016-11-16 NOTE — Telephone Encounter (Signed)
90 day order sent to pharmacy, I called patient and let her know

## 2016-11-29 ENCOUNTER — Other Ambulatory Visit: Payer: Self-pay | Admitting: Family Medicine

## 2016-11-29 ENCOUNTER — Other Ambulatory Visit (HOSPITAL_COMMUNITY)
Admission: RE | Admit: 2016-11-29 | Discharge: 2016-11-29 | Disposition: A | Discharge status: Home / Self Care | Payer: 59 | Source: Ambulatory Visit | Attending: Family Medicine | Admitting: Family Medicine

## 2016-11-29 DIAGNOSIS — Z113 Encounter for screening for infections with a predominantly sexual mode of transmission: Secondary | ICD-10-CM | POA: Insufficient documentation

## 2016-11-29 DIAGNOSIS — Z01411 Encounter for gynecological examination (general) (routine) with abnormal findings: Secondary | ICD-10-CM | POA: Diagnosis not present

## 2016-11-29 DIAGNOSIS — Z1151 Encounter for screening for human papillomavirus (HPV): Secondary | ICD-10-CM | POA: Diagnosis not present

## 2016-11-29 DIAGNOSIS — F330 Major depressive disorder, recurrent, mild: Secondary | ICD-10-CM | POA: Diagnosis not present

## 2016-11-29 DIAGNOSIS — F1010 Alcohol abuse, uncomplicated: Secondary | ICD-10-CM | POA: Diagnosis not present

## 2016-12-03 LAB — URINE CYTOLOGY ANCILLARY ONLY
BACTERIAL VAGINITIS: NEGATIVE
CANDIDA VAGINITIS: NEGATIVE
CHLAMYDIA, DNA PROBE: NEGATIVE
Neisseria Gonorrhea: NEGATIVE
TRICH (WINDOWPATH): NEGATIVE

## 2016-12-04 LAB — CYTOLOGY - PAP
Bacterial vaginitis: NEGATIVE
CHLAMYDIA, DNA PROBE: NEGATIVE
Candida vaginitis: NEGATIVE
DIAGNOSIS: NEGATIVE
HPV (WINDOPATH): NOT DETECTED
Herpes: NEGATIVE
NEISSERIA GONORRHEA: NEGATIVE
TRICH (WINDOWPATH): NEGATIVE

## 2016-12-13 ENCOUNTER — Ambulatory Visit (HOSPITAL_COMMUNITY): Payer: Self-pay | Admitting: Psychiatry

## 2017-06-17 ENCOUNTER — Ambulatory Visit (HOSPITAL_COMMUNITY)
Admission: EM | Admit: 2017-06-17 | Discharge: 2017-06-17 | Disposition: A | Discharge status: Home / Self Care | Payer: 59 | Attending: Nurse Practitioner | Admitting: Nurse Practitioner

## 2017-06-17 ENCOUNTER — Encounter (HOSPITAL_COMMUNITY): Payer: Self-pay | Admitting: Emergency Medicine

## 2017-06-17 DIAGNOSIS — J111 Influenza due to unidentified influenza virus with other respiratory manifestations: Secondary | ICD-10-CM

## 2017-06-17 DIAGNOSIS — M791 Myalgia, unspecified site: Secondary | ICD-10-CM | POA: Diagnosis not present

## 2017-06-17 DIAGNOSIS — R5383 Other fatigue: Secondary | ICD-10-CM | POA: Diagnosis not present

## 2017-06-17 DIAGNOSIS — R69 Illness, unspecified: Secondary | ICD-10-CM | POA: Diagnosis not present

## 2017-06-17 MED ORDER — OSELTAMIVIR PHOSPHATE 75 MG PO CAPS: 75.0000 mg | ORAL_CAPSULE | Freq: Two times a day (BID) | ORAL | 0 refills | Status: DC
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
## 2017-06-17 NOTE — ED Triage Notes (Signed)
Pt reports getting her flu shot on Thursday.  On Friday she began to feel fatigued and have generalized body aches.  Today she states that her throat is beginning to feel sore.

## 2017-09-05 ENCOUNTER — Ambulatory Visit (INDEPENDENT_AMBULATORY_CARE_PROVIDER_SITE_OTHER): Payer: No Typology Code available for payment source | Admitting: Psychiatry

## 2017-09-05 ENCOUNTER — Encounter (HOSPITAL_COMMUNITY): Payer: Self-pay | Admitting: Psychiatry

## 2017-09-05 DIAGNOSIS — Z811 Family history of alcohol abuse and dependence: Secondary | ICD-10-CM

## 2017-09-05 DIAGNOSIS — F99 Mental disorder, not otherwise specified: Secondary | ICD-10-CM | POA: Diagnosis not present

## 2017-09-05 DIAGNOSIS — F5105 Insomnia due to other mental disorder: Secondary | ICD-10-CM

## 2017-09-05 DIAGNOSIS — F411 Generalized anxiety disorder: Secondary | ICD-10-CM

## 2017-09-05 DIAGNOSIS — Z62810 Personal history of physical and sexual abuse in childhood: Secondary | ICD-10-CM | POA: Diagnosis not present

## 2017-09-05 DIAGNOSIS — R45851 Suicidal ideations: Secondary | ICD-10-CM | POA: Diagnosis not present

## 2017-09-05 DIAGNOSIS — F332 Major depressive disorder, recurrent severe without psychotic features: Secondary | ICD-10-CM

## 2017-09-05 DIAGNOSIS — F1010 Alcohol abuse, uncomplicated: Secondary | ICD-10-CM | POA: Diagnosis not present

## 2017-09-05 DIAGNOSIS — F4310 Post-traumatic stress disorder, unspecified: Secondary | ICD-10-CM

## 2017-09-05 DIAGNOSIS — Z813 Family history of other psychoactive substance abuse and dependence: Secondary | ICD-10-CM | POA: Diagnosis not present

## 2017-09-05 DIAGNOSIS — Z87891 Personal history of nicotine dependence: Secondary | ICD-10-CM

## 2017-09-05 MED ORDER — MIRTAZAPINE 30 MG PO TABS: 15.0000 mg | ORAL_TABLET | Freq: Every day | ORAL | 0 refills | Status: DC

## 2017-09-05 MED ORDER — VENLAFAXINE HCL ER 75 MG PO CP24: 75.0000 mg | ORAL_CAPSULE | Freq: Every day | ORAL | 0 refills | Status: DC

## 2017-09-05 MED FILL — VENLAFAXINE HCL ER 75 MG CA: 75 | 30 days supply | Qty: 30 | Fill #0

## 2017-09-05 MED FILL — MIRTAZAPINE 30 MG TABLET: 30 | 90 days supply | Qty: 45 | Fill #0
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
## 2017-09-24 ENCOUNTER — Ambulatory Visit (HOSPITAL_COMMUNITY): Payer: Self-pay | Admitting: Licensed Clinical Social Worker

## 2017-10-10 ENCOUNTER — Ambulatory Visit (HOSPITAL_COMMUNITY): Payer: Self-pay | Admitting: Psychiatry

## 2017-10-10 ENCOUNTER — Encounter (HOSPITAL_COMMUNITY): Payer: Self-pay | Admitting: Psychiatry

## 2017-10-10 ENCOUNTER — Ambulatory Visit (HOSPITAL_COMMUNITY): Payer: No Typology Code available for payment source | Admitting: Psychiatry

## 2017-10-10 DIAGNOSIS — Z62810 Personal history of physical and sexual abuse in childhood: Secondary | ICD-10-CM | POA: Diagnosis not present

## 2017-10-10 DIAGNOSIS — F5105 Insomnia due to other mental disorder: Secondary | ICD-10-CM

## 2017-10-10 DIAGNOSIS — G4700 Insomnia, unspecified: Secondary | ICD-10-CM

## 2017-10-10 DIAGNOSIS — F332 Major depressive disorder, recurrent severe without psychotic features: Secondary | ICD-10-CM | POA: Diagnosis not present

## 2017-10-10 DIAGNOSIS — F411 Generalized anxiety disorder: Secondary | ICD-10-CM | POA: Diagnosis not present

## 2017-10-10 DIAGNOSIS — F99 Mental disorder, not otherwise specified: Secondary | ICD-10-CM

## 2017-10-10 DIAGNOSIS — Z811 Family history of alcohol abuse and dependence: Secondary | ICD-10-CM

## 2017-10-10 DIAGNOSIS — F1290 Cannabis use, unspecified, uncomplicated: Secondary | ICD-10-CM

## 2017-10-10 DIAGNOSIS — F1990 Other psychoactive substance use, unspecified, uncomplicated: Secondary | ICD-10-CM | POA: Diagnosis not present

## 2017-10-10 DIAGNOSIS — F1010 Alcohol abuse, uncomplicated: Secondary | ICD-10-CM | POA: Diagnosis not present

## 2017-10-10 DIAGNOSIS — Z658 Other specified problems related to psychosocial circumstances: Secondary | ICD-10-CM | POA: Diagnosis not present

## 2017-10-10 DIAGNOSIS — Z915 Personal history of self-harm: Secondary | ICD-10-CM

## 2017-10-10 DIAGNOSIS — F4310 Post-traumatic stress disorder, unspecified: Secondary | ICD-10-CM | POA: Diagnosis not present

## 2017-10-10 DIAGNOSIS — Z813 Family history of other psychoactive substance abuse and dependence: Secondary | ICD-10-CM

## 2017-10-10 DIAGNOSIS — Z87891 Personal history of nicotine dependence: Secondary | ICD-10-CM | POA: Diagnosis not present

## 2017-10-10 MED ORDER — VENLAFAXINE HCL ER 150 MG PO CP24: 150.0000 mg | ORAL_CAPSULE | Freq: Every day | ORAL | 2 refills | Status: DC

## 2017-10-10 MED ORDER — MIRTAZAPINE 30 MG PO TABS: 15.0000 mg | ORAL_TABLET | Freq: Every day | ORAL | 2 refills | Status: DC

## 2017-10-10 MED FILL — VENLAFAXINE HCL ER 150 MG C: 150 | 30 days supply | Qty: 30 | Fill #0
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
## 2017-11-04 MED FILL — VENLAFAXINE HCL ER 150 MG C: 150 | 30 days supply | Qty: 30 | Fill #1

## 2017-12-12 ENCOUNTER — Ambulatory Visit (HOSPITAL_COMMUNITY): Payer: No Typology Code available for payment source | Admitting: Psychiatry

## 2017-12-12 ENCOUNTER — Encounter (HOSPITAL_COMMUNITY): Payer: Self-pay | Admitting: Psychiatry

## 2017-12-12 DIAGNOSIS — F411 Generalized anxiety disorder: Secondary | ICD-10-CM

## 2017-12-12 DIAGNOSIS — F5105 Insomnia due to other mental disorder: Secondary | ICD-10-CM

## 2017-12-12 DIAGNOSIS — Z87891 Personal history of nicotine dependence: Secondary | ICD-10-CM | POA: Diagnosis not present

## 2017-12-12 DIAGNOSIS — F1290 Cannabis use, unspecified, uncomplicated: Secondary | ICD-10-CM

## 2017-12-12 DIAGNOSIS — F332 Major depressive disorder, recurrent severe without psychotic features: Secondary | ICD-10-CM

## 2017-12-12 DIAGNOSIS — F4310 Post-traumatic stress disorder, unspecified: Secondary | ICD-10-CM

## 2017-12-12 DIAGNOSIS — F99 Mental disorder, not otherwise specified: Secondary | ICD-10-CM

## 2017-12-12 DIAGNOSIS — Z62810 Personal history of physical and sexual abuse in childhood: Secondary | ICD-10-CM | POA: Diagnosis not present

## 2017-12-12 DIAGNOSIS — Z813 Family history of other psychoactive substance abuse and dependence: Secondary | ICD-10-CM

## 2017-12-12 DIAGNOSIS — Z811 Family history of alcohol abuse and dependence: Secondary | ICD-10-CM

## 2017-12-12 MED ORDER — VENLAFAXINE HCL ER 150 MG PO CP24: 150.0000 mg | ORAL_CAPSULE | Freq: Every day | ORAL | 2 refills | Status: DC

## 2017-12-12 MED ORDER — MIRTAZAPINE 30 MG PO TABS: 15.0000 mg | ORAL_TABLET | Freq: Every day | ORAL | 2 refills | Status: DC

## 2017-12-12 MED FILL — VENLAFAXINE HCL ER 150 MG C: 150 | 30 days supply | Qty: 30 | Fill #0 | Status: TO

## 2017-12-12 MED FILL — MIRTAZAPINE 30 MG TABLET: 30 | 90 days supply | Qty: 45 | Fill #0
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
## 2018-01-09 MED FILL — VENLAFAXINE HCL ER 150 MG C: 150 | 30 days supply | Qty: 30 | Fill #0 | Status: TO

## 2018-02-10 MED FILL — VENLAFAXINE HCL ER 150 MG C: 150 | 30 days supply | Qty: 30 | Fill #2

## 2018-02-22 ENCOUNTER — Ambulatory Visit (INDEPENDENT_AMBULATORY_CARE_PROVIDER_SITE_OTHER): Payer: Self-pay | Admitting: Family Medicine

## 2018-02-22 VITALS — BP 122/74 | HR 94 | Temp 98.7°F | Resp 20 | Ht 64.0 in | Wt 183.2 lb

## 2018-02-22 DIAGNOSIS — Z0000 Encounter for general adult medical examination without abnormal findings: Secondary | ICD-10-CM

## 2018-02-22 NOTE — Progress Notes (Signed)
Amber Craig is a 37 y.o. female who presents today with concerns of need for an employee annual physical exam. Patient reports that they have a primary care provider who manages their care and chronic health conditions. Patient denies any acute conditions that need to be addressed today this visit. She is currently under care of PCP and BH.  Review of Systems  Constitutional: Negative for chills, fever and malaise/fatigue.  HENT: Negative for congestion, ear discharge, ear pain, sinus pain and sore throat.   Eyes: Negative.   Respiratory: Negative for cough, sputum production and shortness of breath.   Cardiovascular: Negative.  Negative for chest pain.  Gastrointestinal: Negative for abdominal pain, diarrhea, nausea and vomiting.  Genitourinary: Negative for dysuria, frequency, hematuria and urgency.  Musculoskeletal: Negative for myalgias.  Skin: Negative.   Neurological: Negative for headaches.  Endo/Heme/Allergies: Negative.   Psychiatric/Behavioral: Negative.     O: Vitals:   02/22/18 1535  BP: 122/74  Pulse: 94  Resp: 20  Temp: 98.7 F (37.1 C)  SpO2: 98%     Physical Exam  Constitutional: She is oriented to person, place, and time. Vital signs are normal. She appears well-developed and well-nourished. She is active.  Non-toxic appearance. She does not have a sickly appearance.  HENT:  Head: Normocephalic.  Right Ear: Hearing, tympanic membrane, external ear and ear canal normal.  Left Ear: Hearing, tympanic membrane, external ear and ear canal normal.  Nose: Nose normal.  Mouth/Throat: Uvula is midline and oropharynx is clear and moist.  Neck: Normal range of motion. Neck supple.  Cardiovascular: Normal rate, regular rhythm, normal heart sounds and normal pulses.  Pulmonary/Chest: Effort normal and breath sounds normal.  Abdominal: Soft. Bowel sounds are normal.  Musculoskeletal: Normal range of motion.  Lymphadenopathy:       Head (right side): No submental and  no submandibular adenopathy present.       Head (left side): No submental and no submandibular adenopathy present.    She has no cervical adenopathy.  Neurological: She is alert and oriented to person, place, and time.  Psychiatric: She has a normal mood and affect. Her speech is normal and behavior is normal. She exhibits abnormal recent memory.  PHQ-9- positive- pt under the care of [facility] advised to continue care Patient denies desire to harm self or others Only able to recall 1 of 3 words in cognitive recall test at 5 minute mark  Vitals reviewed.  A: 1. Physical exam     P: Exam findings, diagnosis etiology and medication use and indications reviewed with patient. Follow- Up and discharge instructions provided. No emergent/urgent issues found on exam.  Patient verbalized understanding of information provided and agrees with plan of care (POC), all questions answered.  1. Physical exam Overall WNL- continue care with treatment team

## 2018-02-22 NOTE — Patient Instructions (Signed)

## 2018-03-06 ENCOUNTER — Ambulatory Visit (HOSPITAL_COMMUNITY): Payer: Self-pay | Admitting: Psychiatry
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
## 2018-03-19 MED FILL — VENLAFAXINE HCL ER 150 MG C: 150 | 30 days supply | Qty: 30 | Fill #0

## 2018-04-10 ENCOUNTER — Ambulatory Visit (INDEPENDENT_AMBULATORY_CARE_PROVIDER_SITE_OTHER): Payer: No Typology Code available for payment source | Admitting: Psychiatry

## 2018-04-10 ENCOUNTER — Encounter (HOSPITAL_COMMUNITY): Payer: Self-pay | Admitting: Psychiatry

## 2018-04-10 VITALS — BP 118/88 | HR 90 | Resp 14 | Ht 64.0 in | Wt 172.0 lb

## 2018-04-10 DIAGNOSIS — F332 Major depressive disorder, recurrent severe without psychotic features: Secondary | ICD-10-CM | POA: Diagnosis not present

## 2018-04-10 DIAGNOSIS — F99 Mental disorder, not otherwise specified: Secondary | ICD-10-CM

## 2018-04-10 DIAGNOSIS — F5105 Insomnia due to other mental disorder: Secondary | ICD-10-CM | POA: Diagnosis not present

## 2018-04-10 DIAGNOSIS — Z813 Family history of other psychoactive substance abuse and dependence: Secondary | ICD-10-CM

## 2018-04-10 DIAGNOSIS — Z811 Family history of alcohol abuse and dependence: Secondary | ICD-10-CM

## 2018-04-10 DIAGNOSIS — F411 Generalized anxiety disorder: Secondary | ICD-10-CM

## 2018-04-10 DIAGNOSIS — R450 Nervousness: Secondary | ICD-10-CM

## 2018-04-10 DIAGNOSIS — F4310 Post-traumatic stress disorder, unspecified: Secondary | ICD-10-CM | POA: Diagnosis not present

## 2018-04-10 DIAGNOSIS — Z87891 Personal history of nicotine dependence: Secondary | ICD-10-CM

## 2018-04-10 MED ORDER — MIRTAZAPINE 30 MG PO TABS: 30.0000 mg | ORAL_TABLET | Freq: Every day | ORAL | 1 refills | Status: DC

## 2018-04-10 MED ORDER — VENLAFAXINE HCL ER 150 MG PO CP24: 150.0000 mg | ORAL_CAPSULE | Freq: Every day | ORAL | 1 refills | Status: DC
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
## 2018-04-25 MED FILL — VENLAFAXINE HCL ER 150 MG C: 150 | 30 days supply | Qty: 30 | Fill #0

## 2018-04-25 MED FILL — MIRTAZAPINE 30 MG TABLET: 30 | 30 days supply | Qty: 30 | Fill #0

## 2018-04-30 ENCOUNTER — Other Ambulatory Visit: Payer: Self-pay | Admitting: Family Medicine

## 2018-04-30 ENCOUNTER — Ambulatory Visit
Admission: RE | Admit: 2018-04-30 | Discharge: 2018-04-30 | Disposition: A | Discharge status: Home / Self Care | Payer: Self-pay | Source: Ambulatory Visit | Attending: Family Medicine | Admitting: Family Medicine

## 2018-04-30 DIAGNOSIS — T148XXA Other injury of unspecified body region, initial encounter: Secondary | ICD-10-CM

## 2018-04-30 MED FILL — INDOMETHACIN 25 MG CAPSULE: 25 | 30 days supply | Qty: 60 | Fill #0

## 2018-05-28 MED FILL — MIRTAZAPINE 30 MG TABLET: 30 | 30 days supply | Qty: 30 | Fill #1

## 2018-05-28 MED FILL — VENLAFAXINE HCL ER 150 MG C: 150 | 30 days supply | Qty: 30 | Fill #1

## 2018-06-05 ENCOUNTER — Ambulatory Visit (INDEPENDENT_AMBULATORY_CARE_PROVIDER_SITE_OTHER): Payer: No Typology Code available for payment source | Admitting: Psychiatry

## 2018-06-05 ENCOUNTER — Other Ambulatory Visit: Payer: Self-pay

## 2018-06-05 ENCOUNTER — Encounter (HOSPITAL_COMMUNITY): Payer: Self-pay | Admitting: Psychiatry

## 2018-06-05 VITALS — BP 135/89 | HR 93 | Resp 16 | Wt 183.6 lb

## 2018-06-05 DIAGNOSIS — F4310 Post-traumatic stress disorder, unspecified: Secondary | ICD-10-CM

## 2018-06-05 DIAGNOSIS — F5105 Insomnia due to other mental disorder: Secondary | ICD-10-CM | POA: Diagnosis not present

## 2018-06-05 DIAGNOSIS — F332 Major depressive disorder, recurrent severe without psychotic features: Secondary | ICD-10-CM

## 2018-06-05 DIAGNOSIS — F99 Mental disorder, not otherwise specified: Secondary | ICD-10-CM

## 2018-06-05 DIAGNOSIS — F411 Generalized anxiety disorder: Secondary | ICD-10-CM

## 2018-06-05 MED ORDER — MIRTAZAPINE 30 MG PO TABS: 30.0000 mg | ORAL_TABLET | Freq: Every day | ORAL | 2 refills | Status: DC

## 2018-06-05 MED ORDER — VENLAFAXINE HCL ER 150 MG PO CP24: 150.0000 mg | ORAL_CAPSULE | Freq: Every day | ORAL | 2 refills | Status: DC
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
## 2018-06-25 MED FILL — VENLAFAXINE HCL ER 150 MG C: 150 | 30 days supply | Qty: 30 | Fill #0

## 2018-06-25 MED FILL — MIRTAZAPINE 30 MG TABLET: 30 | 30 days supply | Qty: 30 | Fill #0

## 2018-07-28 MED FILL — MIRTAZAPINE 30 MG TABLET: 30 | 30 days supply | Qty: 30 | Fill #1

## 2018-07-28 MED FILL — VENLAFAXINE HCL ER 150 MG C: 150 | 30 days supply | Qty: 30 | Fill #1

## 2018-08-26 MED FILL — MIRTAZAPINE 30 MG TABLET: 30 | 30 days supply | Qty: 30 | Fill #2

## 2018-08-26 MED FILL — VENLAFAXINE HCL ER 150 MG C: 150 | 30 days supply | Qty: 30 | Fill #2

## 2018-09-04 ENCOUNTER — Ambulatory Visit (HOSPITAL_COMMUNITY): Payer: No Typology Code available for payment source | Admitting: Psychiatry

## 2018-09-25 ENCOUNTER — Ambulatory Visit (HOSPITAL_COMMUNITY): Payer: No Typology Code available for payment source | Admitting: Psychiatry

## 2018-09-25 ENCOUNTER — Encounter (HOSPITAL_COMMUNITY): Payer: Self-pay | Admitting: Psychiatry

## 2018-09-25 DIAGNOSIS — F332 Major depressive disorder, recurrent severe without psychotic features: Secondary | ICD-10-CM | POA: Diagnosis not present

## 2018-09-25 DIAGNOSIS — F411 Generalized anxiety disorder: Secondary | ICD-10-CM

## 2018-09-25 DIAGNOSIS — F5105 Insomnia due to other mental disorder: Secondary | ICD-10-CM

## 2018-09-25 DIAGNOSIS — F4310 Post-traumatic stress disorder, unspecified: Secondary | ICD-10-CM | POA: Diagnosis not present

## 2018-09-25 DIAGNOSIS — F99 Mental disorder, not otherwise specified: Secondary | ICD-10-CM

## 2018-09-25 MED ORDER — MIRTAZAPINE 30 MG PO TABS: 30.0000 mg | ORAL_TABLET | Freq: Every day | ORAL | 3 refills | Status: DC

## 2018-09-25 MED ORDER — VENLAFAXINE HCL ER 150 MG PO CP24: 150.0000 mg | ORAL_CAPSULE | Freq: Every day | ORAL | 3 refills | Status: DC

## 2018-09-25 MED FILL — VENLAFAXINE HCL ER 150 MG C: 150 | 30 days supply | Qty: 30 | Fill #0

## 2018-09-25 MED FILL — MIRTAZAPINE 30 MG TABLET: 30 | 30 days supply | Qty: 30 | Fill #0
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
## 2018-09-29 MED FILL — METHYLPREDNISOLONE 4 MG TAB: 4 | 6 days supply | Qty: 21 | Fill #0

## 2018-09-29 MED FILL — CHLORHEXIDINE 0.12% RINSE: 0.12 | 15 days supply | Qty: 473 | Fill #0

## 2018-10-27 MED FILL — MIRTAZAPINE 30 MG TABLET: 30 | 30 days supply | Qty: 30 | Fill #1

## 2018-10-27 MED FILL — VENLAFAXINE HCL ER 150 MG C: 150 | 30 days supply | Qty: 30 | Fill #1

## 2018-11-25 MED FILL — VENLAFAXINE HCL ER 150 MG C: 150 | 30 days supply | Qty: 30 | Fill #2

## 2018-11-25 MED FILL — MIRTAZAPINE 30 MG TABLET: 30 | 30 days supply | Qty: 30 | Fill #2

## 2018-12-15 ENCOUNTER — Telehealth: Payer: Self-pay | Admitting: Internal Medicine

## 2018-12-15 DIAGNOSIS — L708 Other acne: Secondary | ICD-10-CM

## 2018-12-15 MED ORDER — DOXYCYCLINE HYCLATE 100 MG PO TBEC: 100.0000 mg | DELAYED_RELEASE_TABLET | Freq: Two times a day (BID) | ORAL | 0 refills | Status: DC

## 2018-12-15 MED ORDER — CLINDAMYCIN PHOSPHATE 1 % EX GEL: Freq: Two times a day (BID) | CUTANEOUS | 0 refills | Status: DC

## 2018-12-15 MED FILL — CLINDAMYCIN PH 1% GEL: 1 | 30 days supply | Qty: 30 | Fill #0

## 2018-12-15 NOTE — Progress Notes (Signed)
We are sorry that you are experiencing this issue.  Here is how we plan to help!  Based on what you shared with me it looks like you have flair of acne.  Acne is a disorder of the hair follicles and oil glands (sebaceous glands). The sebaceous glands secrete oils to keep the skin moist.  When the glands get clogged, it can lead to pimples or cysts.  These cysts may become infected and leave scars. Acne is very common and normally occurs at puberty.  Acne is also inherited.  Your personal care plan consists of the following recommendations:  I recommend that you use a daily cleanser  Oxy-10 face wash  I have prescribed a topical gel with an antibiotic:  Clindamycin  I have also prescribed one of the following additional therapies:  Oral Doxycycline for 10 days  If excessive dryness or peeling occurs, reduce dose frequency or concentration of the topical scrubs.  If excessive stinging or burning occurs, remove the topical gel with mild soap and water and resume at a lower dose the next day.  Remember oral antibiotics and topical acne treatments may increase your sensitivity to the sun!  HOME CARE:  Do not squeeze pimples because that can often lead to infections, worse acne, and scars.  Use a moisturizer that contains retinoid or fruit acids that may inhibit the development of new acne lesions.  Although there is not a clear link that foods can cause acne, doctors do believe that too many sweets predispose you to skin problems.  GET HELP RIGHT AWAY IF:  If your acne gets worse or is not better within 10 days.  If you become depressed.  If you become pregnant, discontinue medications and call your OB/GYN.  MAKE SURE YOU:  Understand these instructions.  Will watch your condition.  Will get help right away if you are not doing well or get worse.   Your e-visit answers were reviewed by a board certified advanced clinical practitioner to complete your personal care plan.   Depending upon the condition, your plan could have included both over the counter or prescription medications.  Please review your pharmacy choice.  If there is a problem, you may contact your provider through [employer] and have the prescription routed to another pharmacy.  Your safety is important to [place].  If you have drug allergies check your prescription carefully.  For the next 24 hours you can use MyChart to ask questions about today's visit, request a non-urgent call back, or ask for a work or school excuse from your e-visit provider.  You will get an email in the next two days asking about your experience. I hope that your e-visit has been valuable and will speed your recovery.

## 2018-12-25 ENCOUNTER — Ambulatory Visit (INDEPENDENT_AMBULATORY_CARE_PROVIDER_SITE_OTHER): Payer: No Typology Code available for payment source | Admitting: Psychiatry

## 2018-12-25 ENCOUNTER — Other Ambulatory Visit: Payer: Self-pay

## 2018-12-25 ENCOUNTER — Encounter (HOSPITAL_COMMUNITY): Payer: Self-pay | Admitting: Psychiatry

## 2018-12-25 DIAGNOSIS — F332 Major depressive disorder, recurrent severe without psychotic features: Secondary | ICD-10-CM | POA: Diagnosis not present

## 2018-12-25 DIAGNOSIS — F99 Mental disorder, not otherwise specified: Secondary | ICD-10-CM

## 2018-12-25 DIAGNOSIS — F5105 Insomnia due to other mental disorder: Secondary | ICD-10-CM | POA: Diagnosis not present

## 2018-12-25 DIAGNOSIS — F411 Generalized anxiety disorder: Secondary | ICD-10-CM

## 2018-12-25 DIAGNOSIS — F4310 Post-traumatic stress disorder, unspecified: Secondary | ICD-10-CM | POA: Diagnosis not present

## 2018-12-25 MED ORDER — MIRTAZAPINE 30 MG PO TABS: 30.0000 mg | ORAL_TABLET | Freq: Every day | ORAL | 2 refills | Status: DC

## 2018-12-25 MED ORDER — CLONIDINE HCL 0.1 MG PO TABS: 0.1000 mg | ORAL_TABLET | Freq: Two times a day (BID) | ORAL | 2 refills | Status: DC | PRN

## 2018-12-25 MED ORDER — VENLAFAXINE HCL ER 75 MG PO CP24: 225.0000 mg | ORAL_CAPSULE | Freq: Every day | ORAL | 2 refills | Status: DC

## 2018-12-25 MED FILL — CloNIDine HCL 0.1 MG TAB: 0.1 | 30 days supply | Qty: 60 | Fill #0

## 2018-12-25 MED FILL — MIRTAZAPINE 30 MG TABLET: 30 | 30 days supply | Qty: 30 | Fill #0

## 2018-12-25 MED FILL — VENLAFAXINE HCL ER 75 MG CA: 75 | 30 days supply | Qty: 90 | Fill #0
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
## 2019-02-02 MED FILL — MIRTAZAPINE 30 MG TABLET: 30 | 30 days supply | Qty: 30 | Fill #1

## 2019-02-02 MED FILL — CloNIDine HCL 0.1 MG TAB: 0.1 | 30 days supply | Qty: 60 | Fill #1

## 2019-02-02 MED FILL — VENLAFAXINE HCL ER 75 MG CA: 75 | 30 days supply | Qty: 90 | Fill #1

## 2019-02-12 ENCOUNTER — Encounter (HOSPITAL_COMMUNITY): Payer: Self-pay | Admitting: Psychiatry

## 2019-02-12 ENCOUNTER — Ambulatory Visit (INDEPENDENT_AMBULATORY_CARE_PROVIDER_SITE_OTHER): Payer: No Typology Code available for payment source | Admitting: Psychiatry

## 2019-02-12 ENCOUNTER — Other Ambulatory Visit: Payer: Self-pay

## 2019-02-12 DIAGNOSIS — F5105 Insomnia due to other mental disorder: Secondary | ICD-10-CM | POA: Diagnosis not present

## 2019-02-12 DIAGNOSIS — F332 Major depressive disorder, recurrent severe without psychotic features: Secondary | ICD-10-CM | POA: Diagnosis not present

## 2019-02-12 DIAGNOSIS — F99 Mental disorder, not otherwise specified: Secondary | ICD-10-CM

## 2019-02-12 DIAGNOSIS — F4310 Post-traumatic stress disorder, unspecified: Secondary | ICD-10-CM | POA: Diagnosis not present

## 2019-02-12 DIAGNOSIS — F411 Generalized anxiety disorder: Secondary | ICD-10-CM | POA: Diagnosis not present

## 2019-02-12 MED ORDER — DIVALPROEX SODIUM ER 250 MG PO TB24: 250.0000 mg | ORAL_TABLET | Freq: Every day | ORAL | 2 refills | Status: DC

## 2019-02-12 MED ORDER — PROPRANOLOL HCL 10 MG PO TABS: 10.0000 mg | ORAL_TABLET | Freq: Every day | ORAL | 1 refills | Status: DC | PRN

## 2019-02-12 MED ORDER — MIRTAZAPINE 30 MG PO TABS: 30.0000 mg | ORAL_TABLET | Freq: Every day | ORAL | 2 refills | Status: DC

## 2019-02-12 MED ORDER — VENLAFAXINE HCL ER 75 MG PO CP24: 225.0000 mg | ORAL_CAPSULE | Freq: Every day | ORAL | 2 refills | Status: DC

## 2019-02-12 MED FILL — DIVALPROEX SOD ER 250 MG TA: 250 | 30 days supply | Qty: 30 | Fill #0

## 2019-02-12 MED FILL — PROPRANOLOL 10 MG TABLET: 10 | 30 days supply | Qty: 30 | Fill #0

## 2019-02-12 NOTE — Progress Notes (Signed)
Virtual Visit via Telephone Note  I connected with Amber Craig on 02/12/19 at  8:30 AM EDT by telephone and verified that I am speaking with the correct person using two identifiers.  Location: Patient: Home Provider: Office   I discussed the limitations, risks, security and privacy concerns of performing an evaluation and management service by telephone and the availability of in person appointments. I also discussed with the patient that there may be a patient responsible charge related to this service. The patient expressed understanding and agreed to proceed.   History of Present Illness: Pt states she is very stressed by the racism and coronavirus. After Amber Craig died she called out for 1 week due to anger.  She feels upset, irritated, angry, anxious and overwhelmed. Her co-workers have made some terrible racist comments. Pt does not like her job at all and she has asked for change but nothing is getting better. Pt is avoiding collegues because she is so stressed. 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 She doesn't know how to deal with all of this. She will come home from work and drink a bottle of wine and smoke 2 grams of THC. It calms her down and numbs the anger.  She will then take her sleeping pill and sleep. Amber Craig says her sleep is good. Pt feels angry all the time.   Amber Craig has been talking to her mom daily for support. Pt will take Clonidine 2x/week when not drinking but it does make her drowsy so she can't take it at work.  Her PTSD is increased. Today is the day she had the abortion. She is in pain due to current menstrual cycle. Amber Craig is having intrusive memories and intense emotions. She is also having intense pain in her head and neck partially due to PPE and partially due to emotions. Her depression is "not that bad". Pt denies SI/HI.  She is asking for FMLA due to work stress.  She is able to maintain when she is off work for several days. She does  feel drowsy when she takes Effexor and it helps a lot.    Observations/Objective: I spoke with Amber Craig on the phone.  Pt was cooperative.  Pt was engaged in the conversation and answered questions appropriately.  Speech was clear and coherent with increased rate. Normal  tone and volume.  Mood is irritable and anxious, affect is congruent. Thought processes are coherent and intact.  Thought content is with ruminations.  Pt denies SI/HI.   Pt denies auditory and visual hallucinations and did not appear to be responding to internal stimuli.  Memory and concentration are good.  Fund of knowledge and use of language are average.  Insight and judgment are fair.  I am unable to comment on psychomotor activity, general appearance, hygiene, or eye contact as I was unable to physically see the patient on the phone.  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 Pt will monitor for hypotension.  Effexor 225 mg p.o. daily for GAD, PTSD and MDD Remeron 30 mg p.o. nightly for MDD, GAD, PTSD and insomnia Benadryl 25 mg p.o. nightly as needed insomnia  We discussed the negative effects of alcohol and marijuana use.  I strongly advised the patient to quit both.  I  also advised the patient not to take sedatives when drinking alcohol.  Patient verbalized understanding.  I declined patient's request for FMLA as she is reporting significant distress at work but is able to perform well.  Follow Up Instructions: In 2 months or sooner if needed   I discussed the assessment and treatment plan with the patient. The patient was provided an opportunity to ask questions and all were answered. The patient agreed with the plan and demonstrated an understanding of the instructions.   The patient was advised to  call back or seek an in-person evaluation if the symptoms worsen or if the condition fails to improve as anticipated.  I provided 20 minutes of non-face-to-face time during this encounter.   [name], MD

## 2019-03-02 MED FILL — CloNIDine HCL 0.1 MG TAB: 0.1 | 30 days supply | Qty: 60 | Fill #2

## 2019-03-02 MED FILL — VENLAFAXINE HCL ER 75 MG CA: 75 | 30 days supply | Qty: 90 | Fill #2

## 2019-03-02 MED FILL — MIRTAZAPINE 30 MG TABLET: 30 | 30 days supply | Qty: 30 | Fill #2

## 2019-03-17 MED FILL — PROPRANOLOL 10 MG TABLET: 10 | 30 days supply | Qty: 30 | Fill #1

## 2019-03-17 MED FILL — DIVALPROEX SOD ER 250 MG TA: 250 | 30 days supply | Qty: 30 | Fill #1

## 2019-03-27 MED FILL — MIRTAZAPINE 30 MG TABLET: 30 | 30 days supply | Qty: 30 | Fill #0

## 2019-03-27 MED FILL — VENLAFAXINE HCL ER 75 MG CA: 75 | 30 days supply | Qty: 90 | Fill #0

## 2019-04-16 ENCOUNTER — Ambulatory Visit (INDEPENDENT_AMBULATORY_CARE_PROVIDER_SITE_OTHER): Payer: No Typology Code available for payment source | Admitting: Psychiatry

## 2019-04-16 ENCOUNTER — Telehealth (HOSPITAL_COMMUNITY): Payer: Self-pay | Admitting: Psychiatry

## 2019-04-16 ENCOUNTER — Other Ambulatory Visit: Payer: Self-pay

## 2019-04-16 DIAGNOSIS — Z5329 Procedure and treatment not carried out because of patient's decision for other reasons: Secondary | ICD-10-CM

## 2019-04-16 NOTE — Progress Notes (Signed)
Pt was a no show

## 2019-04-16 NOTE — Telephone Encounter (Signed)
I called the patient twice during our scheduled appt time and there was no answer. I left 2 voice messages. Pt is a no show for her appointment today

## 2019-04-16 NOTE — Progress Notes (Deleted)
Virtual Visit via Telephone Note  I connected with [name] on 04/16/19 at 11:00 AM EDT by telephone and verified that I am speaking with the correct person using two identifiers.  Location: Patient: *** Provider: ***   I discussed the limitations, risks, security and privacy concerns of performing an evaluation and management service by telephone and the availability of in person appointments. I also discussed with the patient that there may be a patient responsible charge related to this service. The patient expressed understanding and agreed to proceed.   History of Present Illness:    Observations/Objective:   Assessment and Plan:   Follow Up Instructions:    I discussed the assessment and treatment plan with the patient. The patient was provided an opportunity to ask questions and all were answered. The patient agreed with the plan and demonstrated an understanding of the instructions.   The patient was advised to call back or seek an in-person evaluation if the symptoms worsen or if the condition fails to improve as anticipated.  I provided *** minutes of non-face-to-face time during this encounter.   [name], MD

## 2019-04-21 ENCOUNTER — Other Ambulatory Visit (HOSPITAL_COMMUNITY): Payer: Self-pay | Admitting: Psychiatry

## 2019-04-21 DIAGNOSIS — F411 Generalized anxiety disorder: Secondary | ICD-10-CM

## 2019-04-21 MED FILL — DIVALPROEX SOD ER 250 MG TA: 250 | 30 days supply | Qty: 30 | Fill #2

## 2019-04-23 ENCOUNTER — Other Ambulatory Visit (HOSPITAL_COMMUNITY): Payer: Self-pay | Admitting: Psychiatry

## 2019-04-23 DIAGNOSIS — F4310 Post-traumatic stress disorder, unspecified: Secondary | ICD-10-CM

## 2019-04-23 DIAGNOSIS — F332 Major depressive disorder, recurrent severe without psychotic features: Secondary | ICD-10-CM

## 2019-04-23 DIAGNOSIS — F411 Generalized anxiety disorder: Secondary | ICD-10-CM

## 2019-04-24 MED FILL — PROPRANOLOL 10 MG TABLET: 10 | 30 days supply | Qty: 30 | Fill #0

## 2019-05-01 MED FILL — VENLAFAXINE HCL ER 75 MG CA: 75 | 30 days supply | Qty: 90 | Fill #1

## 2019-05-01 MED FILL — MIRTAZAPINE 30 MG TABLET: 30 | 30 days supply | Qty: 30 | Fill #1

## 2019-05-14 ENCOUNTER — Other Ambulatory Visit: Payer: Self-pay

## 2019-05-14 ENCOUNTER — Ambulatory Visit (HOSPITAL_COMMUNITY): Payer: No Typology Code available for payment source | Admitting: Psychiatry

## 2019-05-21 ENCOUNTER — Other Ambulatory Visit: Payer: Self-pay

## 2019-05-21 ENCOUNTER — Ambulatory Visit (HOSPITAL_COMMUNITY): Payer: No Typology Code available for payment source | Admitting: Psychiatry

## 2019-05-25 ENCOUNTER — Other Ambulatory Visit (HOSPITAL_COMMUNITY): Payer: Self-pay

## 2019-05-25 DIAGNOSIS — F332 Major depressive disorder, recurrent severe without psychotic features: Secondary | ICD-10-CM

## 2019-05-25 DIAGNOSIS — F411 Generalized anxiety disorder: Secondary | ICD-10-CM

## 2019-05-25 DIAGNOSIS — F5105 Insomnia due to other mental disorder: Secondary | ICD-10-CM

## 2019-05-25 DIAGNOSIS — F4310 Post-traumatic stress disorder, unspecified: Secondary | ICD-10-CM

## 2019-05-25 DIAGNOSIS — F99 Mental disorder, not otherwise specified: Secondary | ICD-10-CM

## 2019-05-25 MED ORDER — PROPRANOLOL HCL 10 MG PO TABS: 10.0000 mg | ORAL_TABLET | Freq: Every day | ORAL | 0 refills | Status: DC | PRN

## 2019-05-25 MED ORDER — MIRTAZAPINE 30 MG PO TABS: 30.0000 mg | ORAL_TABLET | Freq: Every day | ORAL | 0 refills | Status: DC

## 2019-05-25 MED ORDER — VENLAFAXINE HCL ER 75 MG PO CP24: 225.0000 mg | ORAL_CAPSULE | Freq: Every day | ORAL | 0 refills | Status: DC

## 2019-05-25 MED ORDER — DIVALPROEX SODIUM ER 250 MG PO TB24: 250.0000 mg | ORAL_TABLET | Freq: Every day | ORAL | 0 refills | Status: DC

## 2019-05-25 MED FILL — PROPRANOLOL 10 MG TABLET: 10 | 30 days supply | Qty: 30 | Fill #0

## 2019-05-25 MED FILL — VENLAFAXINE HCL ER 75 MG CA: 75 | 30 days supply | Qty: 90 | Fill #0

## 2019-05-25 MED FILL — DIVALPROEX SOD ER 250 MG TA: 250 | 30 days supply | Qty: 30 | Fill #0

## 2019-05-25 MED FILL — MIRTAZAPINE 30 MG TABLET: 30 | 30 days supply | Qty: 30 | Fill #0

## 2019-06-11 ENCOUNTER — Encounter (HOSPITAL_COMMUNITY): Payer: Self-pay | Admitting: Psychiatry

## 2019-06-11 ENCOUNTER — Other Ambulatory Visit: Payer: Self-pay

## 2019-06-11 ENCOUNTER — Ambulatory Visit (INDEPENDENT_AMBULATORY_CARE_PROVIDER_SITE_OTHER): Payer: No Typology Code available for payment source | Admitting: Psychiatry

## 2019-06-11 DIAGNOSIS — F411 Generalized anxiety disorder: Secondary | ICD-10-CM

## 2019-06-11 DIAGNOSIS — F5105 Insomnia due to other mental disorder: Secondary | ICD-10-CM

## 2019-06-11 DIAGNOSIS — F4310 Post-traumatic stress disorder, unspecified: Secondary | ICD-10-CM

## 2019-06-11 DIAGNOSIS — F332 Major depressive disorder, recurrent severe without psychotic features: Secondary | ICD-10-CM | POA: Diagnosis not present

## 2019-06-11 DIAGNOSIS — F99 Mental disorder, not otherwise specified: Secondary | ICD-10-CM

## 2019-06-11 MED ORDER — PROPRANOLOL HCL 20 MG PO TABS: 20.0000 mg | ORAL_TABLET | Freq: Every day | ORAL | 2 refills | Status: DC

## 2019-06-11 MED ORDER — DIVALPROEX SODIUM ER 500 MG PO TB24: 500.0000 mg | ORAL_TABLET | Freq: Every day | ORAL | 0 refills | Status: DC

## 2019-06-11 MED ORDER — VENLAFAXINE HCL ER 75 MG PO CP24: 225.0000 mg | ORAL_CAPSULE | Freq: Every day | ORAL | 0 refills | Status: DC

## 2019-06-11 MED ORDER — MIRTAZAPINE 30 MG PO TABS: 30.0000 mg | ORAL_TABLET | Freq: Every day | ORAL | 0 refills | Status: DC

## 2019-06-11 MED FILL — PROPRANOLOL 20 MG TABLET: 20 | 30 days supply | Qty: 30 | Fill #0

## 2019-06-11 NOTE — Progress Notes (Signed)
Virtual Visit via Telephone Note  I connected with Amber Craig  on 06/11/19 at  9:45 AM EDT by telephone and verified that I am speaking with the correct person using two identifiers.  Location: Patient: home Provider: office   I discussed the limitations, risks, security and privacy concerns of performing an evaluation and management service by telephone and the availability of in person appointments. I also discussed with the patient that there may be a patient responsible charge related to this service. The patient expressed understanding and agreed to proceed.   History of Present Illness: "I am doing pretty good". She had an appointment with her therapist yesterday. She admits to using marijuana and cigarettes regularly. She has stopped drinking alcohol. The last time was one month ago. Her therapist has made her realize that she has substituted marijuana for alcohol and wants pt to start going to [place]. Pt is very hesitant to go because she does not want to admit to being an alcoholic and be held accountable. She does feel better now that she stopped drinking. She is sleeping better, less tired and is less irritable. The marijuana is no longer as effective because she has built up a tolerance. She can't seem to stop. She is smoking 1 pack of cigarettes a day. Pt admits she wastes a lot time. She will stay up playing on her phone for 1-2 days and on day 3 she "will crash and sleep deep". "Work is rough. I am having lots of problems with co-workers". She will often get angry with co-workers. The Depakote is helping her to take a moment and sometimes walk away. She does not think she is depressed anymore. Angelique is no longer numb. She is grateful she has a life and wants to live. She denies SI/HI. Her anxiety is up. The Propranolol is effective to a small degree. She thinks it is due to smoking and energy drinks. Her PTSD is a problem. Her sleep is good with Remeron. She does have a lot of "weird  dreams about the past". Trysten is HV and paranoid. A neighbors strange behavior has triggered her intrusive memories and HV.    Observations/Objective: I spoke with Amber Craig on the phone.  Pt was calm, pleasant and cooperative.  Pt was engaged in the conversation and answered questions appropriately.  Speech was clear and coherent with normal rate, tone and volume.  Mood is depressed and anxious, affect is congruent but calmer, brighter and less irritable. Thought processes are coherent, goal oriented and intact.  Thought content is logical.  Pt denies SI/HI.   Pt denies auditory and visual hallucinations and did not appear to be responding to internal stimuli.  Memory and concentration are good.  Fund of knowledge and use of language are average.  Insight and judgment are fair.  I am unable to comment on psychomotor activity, general appearance, hygiene, or eye contact as I was unable to physically see the patient on the phone.  Vital signs not available since interview conducted virtually.     Assessment and Plan: GAD; PTSD; MDD-recurrent, moderate; Cannabis use d/o; Alcohol use d/o; MDMA use d/o- in remission  Status of current symptoms: ongoing drug abuse, stopped alcohol use, ongoing PTSD.   Increase Depakote ER 500mg  po qD for anger and irritability  Increase Propanolol 20mg  po qD prn anxiety. Pt will monitor for hypotension  Continue: Effexor XR 225mg  po qD for GAD, PTSD, MDD  Remeron 30mg  po qHS for MDD, GAD, PTSD  and insomnia  D/c Benadryl   Encouraged her tp continue therapy  Discussed her ongoing drug use  Follow Up Instructions: In 6-8 weeks or sooner if needed   I discussed the assessment and treatment plan with the patient. The patient was provided an opportunity to ask questions and all were answered. The patient agreed with the plan and demonstrated an understanding of the instructions.   The patient was advised to call back or seek an in-person evaluation if the  symptoms worsen or if the condition fails to improve as anticipated.  I provided 35 minutes of non-face-to-face time during this encounter.   [name], MD

## 2019-06-30 MED FILL — DIVALPROEX SOD ER 500 MG TA: 500 | 90 days supply | Qty: 90 | Fill #0

## 2019-06-30 MED FILL — MIRTAZAPINE 30 MG TABLET: 30 | 90 days supply | Qty: 90 | Fill #0

## 2019-06-30 MED FILL — VENLAFAXINE HCL ER 75 MG CA: 75 | 30 days supply | Qty: 90 | Fill #0

## 2019-07-08 ENCOUNTER — Ambulatory Visit: Payer: Self-pay | Admitting: Pharmacist

## 2019-07-08 ENCOUNTER — Other Ambulatory Visit: Payer: Self-pay

## 2019-07-08 DIAGNOSIS — Z7189 Other specified counseling: Secondary | ICD-10-CM

## 2019-07-08 MED ORDER — LEUPROLIDE ACETATE 7.5 MG IM KIT: 7.5000 mg | PACK | INTRAMUSCULAR | 0 refills | Status: DC

## 2019-07-08 MED FILL — LUPRON DEPOT 7.5 MG KIT: 7.5 | 30 days supply | Qty: 1 | Fill #0

## 2019-07-08 NOTE — Progress Notes (Signed)
  S: Patient presents today for review of their specialty medication.   Patient is currently prescribed Lupron for fibroids. Surgery planned for Jan, 2021. Patient is managed by [facility] for this.   Dosing:  7.5 mg IM every 3 months   Dosing: Renal Impairment: Adult  There are no dosage adjustments provided in the manufacturer's labeling (has not been studied). Dosing: Hepatic Impairment: Adult  There are no dosage adjustments provided in the manufacturer's labeling (has not been studied).  Adherence: has not yet started  Efficacy: has not yet started  Monitoring: Injection site reactions: has not yet started Mood changes: has not yet started Pregnancy status: negative per patient   Current adverse effects: has not yet started  O:  Lab Results  Component Value Date   WBC 3.8 (L) 11/14/2015   HGB 13.7 11/14/2015   HCT 42.9 11/14/2015   MCV 88.6 11/14/2015   PLT 225 11/14/2015      Chemistry      Component Value Date/Time   NA 137 11/14/2015 1145   K 4.1 11/14/2015 1145   CL 104 11/14/2015 1145   CO2 26 11/14/2015 1145   BUN 12 11/14/2015 1145   CREATININE 0.83 11/14/2015 1145      Component Value Date/Time   CALCIUM 8.8 (L) 11/14/2015 1145   ALKPHOS 49 11/14/2015 1145   AST 22 11/14/2015 1145   ALT 16 11/14/2015 1145   BILITOT 0.7 11/14/2015 1145      A/P: 1. Medication review: patient currently prescribed Lupron for fibroids. Pt has surgery planned for January, 2021. Reviewed the medication with the patient, including the following: Lupron is a Gonadotropin Releasing Hormone Agonist used in the treatment of prostate cancer, endometriosis, uterine fibroids, and central precocious puberty. IM: Lupron Depot, Lupron Depot-Ped: Administer as a single injection into the gluteal area, anterior thigh, or deltoid. Vary injection site periodically. Administer within 2 hours of preparation. Possible adverse effects include flushing, diaphoresis, GI upset, headache, mood  changes, hot flashes, weight changes, hematologic issues, and injection site reactions. No recommendations for any changes.   Benard Halsted, PharmD, [place] [phone]

## 2019-07-31 MED FILL — VENLAFAXINE HCL ER 75 MG CA: 75 | 30 days supply | Qty: 90 | Fill #2

## 2019-07-31 MED FILL — PROPRANOLOL 20 MG TABLET: 20 | 30 days supply | Qty: 30 | Fill #1

## 2019-08-06 ENCOUNTER — Encounter (HOSPITAL_COMMUNITY): Payer: Self-pay | Admitting: Psychiatry

## 2019-08-06 ENCOUNTER — Other Ambulatory Visit: Payer: Self-pay

## 2019-08-06 ENCOUNTER — Ambulatory Visit (INDEPENDENT_AMBULATORY_CARE_PROVIDER_SITE_OTHER): Payer: No Typology Code available for payment source | Admitting: Psychiatry

## 2019-08-06 DIAGNOSIS — F5105 Insomnia due to other mental disorder: Secondary | ICD-10-CM

## 2019-08-06 DIAGNOSIS — F332 Major depressive disorder, recurrent severe without psychotic features: Secondary | ICD-10-CM

## 2019-08-06 DIAGNOSIS — F4310 Post-traumatic stress disorder, unspecified: Secondary | ICD-10-CM

## 2019-08-06 DIAGNOSIS — F17200 Nicotine dependence, unspecified, uncomplicated: Secondary | ICD-10-CM

## 2019-08-06 DIAGNOSIS — F99 Mental disorder, not otherwise specified: Secondary | ICD-10-CM

## 2019-08-06 DIAGNOSIS — F411 Generalized anxiety disorder: Secondary | ICD-10-CM

## 2019-08-06 DIAGNOSIS — F1210 Cannabis abuse, uncomplicated: Secondary | ICD-10-CM

## 2019-08-06 DIAGNOSIS — F1021 Alcohol dependence, in remission: Secondary | ICD-10-CM

## 2019-08-06 MED ORDER — PROPRANOLOL HCL 20 MG PO TABS: 20.0000 mg | ORAL_TABLET | Freq: Every day | ORAL | 0 refills | Status: DC

## 2019-08-06 MED ORDER — MIRTAZAPINE 30 MG PO TABS: 30.0000 mg | ORAL_TABLET | Freq: Every day | ORAL | 0 refills | Status: DC

## 2019-08-06 MED ORDER — VENLAFAXINE HCL ER 75 MG PO CP24: 225.0000 mg | ORAL_CAPSULE | Freq: Every day | ORAL | 0 refills | Status: DC

## 2019-08-06 MED ORDER — DIVALPROEX SODIUM ER 500 MG PO TB24: 500.0000 mg | ORAL_TABLET | Freq: Every day | ORAL | 0 refills | Status: DC

## 2019-08-06 NOTE — Progress Notes (Signed)
  Virtual Visit via Telephone Note  I connected with [name]  on 08/06/19 at 10:30 AM EST by telephone and verified that I am speaking with the correct person using two identifiers.  Location: Patient: home Provider: office   I discussed the limitations, risks, security and privacy concerns of performing an evaluation and management service by telephone and the availability of in person appointments. I also discussed with the patient that there may be a patient responsible charge related to this service. The patient expressed understanding and agreed to proceed.   History of Present Illness:  Pamula shares that she is doing much better. She is able to feel her emotions now rather than being "frozen". Her anxiety is significantly improved. She stills irritable and angry but is better able to control her response. HV is ongoing. She does things to make herself feel safe by locking her doors and watching her surroundings. Overall PTSD is ok. Sarynity denies depression and denies SI/HI. She is still smoking 1 pack cigs/day and 4-5 blunts day. She denies any other drug use.  She stopped drinking alcohol 3 weeks ago She started AA and wants to try NA. Trying to find the best fit. She does not have a sponsor yet. The medications are helping a lot.   Observations/Objective: I spoke with [name] on the phone.  Pt was calm, pleasant and cooperative.  Pt was engaged in the conversation and answered questions appropriately.  Speech was clear and coherent with normal rate, tone and volume.  Mood is euthymic, affect is full. Thought processes are coherent, goal oriented and intact.  Thought content is logical.  Pt denies SI/HI.   Pt denies auditory and visual hallucinations and did not appear to be responding to internal stimuli.  Memory and concentration are good.  Fund of knowledge and use of language are average.  Insight and judgment are fair.  I am unable to comment on psychomotor activity,  general appearance, hygiene, or eye contact as I was unable to physically see the patient on the phone.  Vital signs not available since interview conducted virtually.    I reviewed the information below on 08/06/2019 Assessment and Plan: GAD; PTSD; MDD-recurrent, moderate; Cannabis use d/o- ongoing; Alcohol use d/o- in early remission; nicotine use d/o- ongoing; MDMA use d/o- in remission  Status of current symptoms: anxiety, PTSD and depressed are improved.     Depakote ER 500mg  po qD for anger and irritability   Propanolol 20mg  po qD prn anxiety. Pt will monitor for hypotension   Effexor XR 225mg  po qD for GAD, PTSD, MDD   Remeron 30mg  po qHS for MDD, GAD, PTSD and insomnia     Encouraged her to continue therapy   Discussed her ongoing drug use. Pt is trying out AA and NA. She has not yet identified a sponser    Follow Up Instructions: In 8 weeks or sooner if needed   I discussed the assessment and treatment plan with the patient. The patient was provided an opportunity to ask questions and all were answered. The patient agreed with the plan and demonstrated an understanding of the instructions.   The patient was advised to call back or seek an in-person evaluation if the symptoms worsen or if the condition fails to improve as anticipated.  I provided 20 minutes of non-face-to-face time during this encounter.   [name], MD

## 2019-08-21 ENCOUNTER — Other Ambulatory Visit: Payer: Self-pay

## 2019-08-21 ENCOUNTER — Encounter (HOSPITAL_BASED_OUTPATIENT_CLINIC_OR_DEPARTMENT_OTHER): Payer: Self-pay | Admitting: *Deleted

## 2019-08-21 ENCOUNTER — Emergency Department (HOSPITAL_BASED_OUTPATIENT_CLINIC_OR_DEPARTMENT_OTHER)
Admission: EM | Admit: 2019-08-21 | Discharge: 2019-08-21 | Disposition: A | Discharge status: Home / Self Care | Payer: No Typology Code available for payment source | Attending: Emergency Medicine | Admitting: Emergency Medicine

## 2019-08-21 DIAGNOSIS — F17210 Nicotine dependence, cigarettes, uncomplicated: Secondary | ICD-10-CM | POA: Insufficient documentation

## 2019-08-21 DIAGNOSIS — Y92410 Unspecified street and highway as the place of occurrence of the external cause: Secondary | ICD-10-CM | POA: Insufficient documentation

## 2019-08-21 DIAGNOSIS — Y999 Unspecified external cause status: Secondary | ICD-10-CM | POA: Insufficient documentation

## 2019-08-21 DIAGNOSIS — S298XXA Other specified injuries of thorax, initial encounter: Secondary | ICD-10-CM | POA: Diagnosis present

## 2019-08-21 DIAGNOSIS — S161XXA Strain of muscle, fascia and tendon at neck level, initial encounter: Secondary | ICD-10-CM | POA: Diagnosis not present

## 2019-08-21 DIAGNOSIS — R0789 Other chest pain: Secondary | ICD-10-CM | POA: Insufficient documentation

## 2019-08-21 DIAGNOSIS — S098XXA Other specified injuries of head, initial encounter: Secondary | ICD-10-CM | POA: Diagnosis not present

## 2019-08-21 DIAGNOSIS — Y9389 Activity, other specified: Secondary | ICD-10-CM | POA: Insufficient documentation

## 2019-08-21 DIAGNOSIS — S0990XA Unspecified injury of head, initial encounter: Secondary | ICD-10-CM

## 2019-08-21 MED ORDER — METHOCARBAMOL 500 MG PO TABS: 500.0000 mg | ORAL_TABLET | Freq: Three times a day (TID) | ORAL | 0 refills | Status: DC | PRN

## 2019-08-21 MED ORDER — IBUPROFEN 800 MG PO TABS: 800.0000 mg | ORAL_TABLET | Freq: Three times a day (TID) | ORAL | 0 refills | Status: DC | PRN
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
## 2019-08-21 NOTE — Discharge Instructions (Signed)

## 2019-08-31 MED FILL — PROPRANOLOL 20 MG TABLET: 20 | 90 days supply | Qty: 90 | Fill #0

## 2019-08-31 MED FILL — VENLAFAXINE HCL ER 75 MG CA: 75 | 90 days supply | Qty: 270 | Fill #0
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
## 2019-09-07 ENCOUNTER — Encounter (INDEPENDENT_AMBULATORY_CARE_PROVIDER_SITE_OTHER): Payer: Self-pay

## 2019-09-07 NOTE — Progress Notes (Signed)
[place], [place] - [address] [place] [address] Phone: [phone] Fax: [phone]      Your procedure is scheduled on Tuesday, 09/15/2019.  Report to [facility] Main Entrance "A" at 06:00 A.M., and check in at the Admitting office.  Call this number if you have problems the morning of surgery:  [phone]  Call [phone] if you have any questions prior to your surgery date Monday-Friday 8am-4pm    Remember:  Do not eat after midnight the night before your surgery  You may drink clear liquids until 05:00 the morning of your surgery.   Clear liquids allowed are: Water, Non-Citrus Juices (without pulp), Carbonated Beverages, Clear Tea, Black Coffee Only, and Gatorade    Take these medicines the morning of surgery with A SIP OF WATER: Divalproex (Depakote ER) Venlafaxine XR (Effexor XR)  7 days prior to surgery STOP taking any Aspirin (unless otherwise instructed by your surgeon), Aleve, Naproxen, Ibuprofen, Motrin, Advil, Goody's, BC's, all herbal medications, fish oil, and all vitamins.    The Morning of Surgery  Do not wear jewelry, make-up or nail polish.  Do not wear lotions, powders, perfumes, or deodorant  Do not shave 48 hours prior to surgery.  Do not bring valuables to the hospital.  [facility] is not responsible for any belongings or valuables.  If you are a smoker, DO NOT Smoke 24 hours prior to surgery  If you wear a CPAP at night please bring your mask, tubing, and machine the morning of surgery   Remember that you must have someone to transport you home after your surgery, and remain with you for 24 hours if you are discharged the same day.   Please bring cases for contacts, glasses, hearing aids, dentures or bridgework because it cannot be worn into surgery.    Leave your suitcase in the car.  After surgery it may be brought to your room.  For patients admitted to the hospital,  discharge time will be determined by your treatment team.  Patients discharged the day of surgery will not be allowed to drive home.    Special instructions:   [facility]- Preparing For Surgery  Before surgery, you can play an important role. Because skin is not sterile, your skin needs to be as free of germs as possible. You can reduce the number of germs on your skin by washing with CHG (chlorahexidine gluconate) Soap before surgery.  CHG is an antiseptic cleaner which kills germs and bonds with the skin to continue killing germs even after washing.    Oral Hygiene is also important to reduce your risk of infection.  Remember - BRUSH YOUR TEETH THE MORNING OF SURGERY WITH YOUR REGULAR TOOTHPASTE  Please do not use if you have an allergy to CHG or antibacterial soaps. If your skin becomes reddened/irritated stop using the CHG.  Do not shave (including legs and underarms) for at least 48 hours prior to first CHG shower. It is OK to shave your face.  Please follow these instructions carefully.   1. Shower the NIGHT BEFORE SURGERY and the MORNING OF SURGERY with CHG Soap.   2. If you chose to wash your hair, wash your hair first as usual with your normal shampoo.  3. After you shampoo, rinse your hair and body thoroughly to remove the shampoo.  4. Use CHG as you would any other liquid soap. You can apply CHG directly to the skin and  wash gently with a scrungie or a clean washcloth.   5. Apply the CHG Soap to your body ONLY FROM THE NECK DOWN.  Do not use on open wounds or open sores. Avoid contact with your eyes, ears, mouth and genitals (private parts). Wash Face and genitals (private parts)  with your normal soap.   6. Wash thoroughly, paying special attention to the area where your surgery will be performed.  7. Thoroughly rinse your body with warm water from the neck down.  8. DO NOT shower/wash with your normal soap after using and rinsing off the CHG Soap.  9. Pat yourself dry  with a CLEAN TOWEL.  10. Wear CLEAN PAJAMAS to bed the night before surgery, wear comfortable clothes the morning of surgery  11. Place CLEAN SHEETS on your bed the night of your first shower and DO NOT SLEEP WITH PETS.    Day of Surgery:  Please shower the morning of surgery with the CHG soap Do not apply any deodorants/lotions. Please wear clean clothes to the hospital/surgery center.   Remember to brush your teeth WITH YOUR REGULAR TOOTHPASTE.   Please read over the following fact sheets that you were given.

## 2019-09-08 ENCOUNTER — Encounter (HOSPITAL_COMMUNITY): Payer: Self-pay

## 2019-09-08 ENCOUNTER — Other Ambulatory Visit: Payer: Self-pay

## 2019-09-08 ENCOUNTER — Encounter (HOSPITAL_COMMUNITY)
Admission: RE | Admit: 2019-09-08 | Discharge: 2019-09-08 | Disposition: A | Discharge status: Home / Self Care | Payer: No Typology Code available for payment source | Source: Ambulatory Visit | Attending: Obstetrics and Gynecology | Admitting: Obstetrics and Gynecology

## 2019-09-08 DIAGNOSIS — Z01812 Encounter for preprocedural laboratory examination: Secondary | ICD-10-CM | POA: Diagnosis present

## 2019-09-08 LAB — CBC
HCT: 42.1 % (ref 36.0–46.0)
Hemoglobin: 13.1 g/dL (ref 12.0–15.0)
MCH: 28 pg (ref 26.0–34.0)
MCHC: 31.1 g/dL (ref 30.0–36.0)
MCV: 90 fL (ref 80.0–100.0)
Platelets: 241 10*3/uL (ref 150–400)
RBC: 4.68 MIL/uL (ref 3.87–5.11)
RDW: 14.6 % (ref 11.5–15.5)
WBC: 5.5 10*3/uL (ref 4.0–10.5)
nRBC: 0 % (ref 0.0–0.2)

## 2019-09-08 LAB — TYPE AND SCREEN
ABO/RH(D): O POS
Antibody Screen: NEGATIVE

## 2019-09-08 LAB — COMPREHENSIVE METABOLIC PANEL
ALT: 17 U/L (ref 0–44)
AST: 23 U/L (ref 15–41)
Albumin: 3.6 g/dL (ref 3.5–5.0)
Alkaline Phosphatase: 61 U/L (ref 38–126)
Anion gap: 8 (ref 5–15)
BUN: 14 mg/dL (ref 6–20)
CO2: 29 mmol/L (ref 22–32)
Calcium: 8.9 mg/dL (ref 8.9–10.3)
Chloride: 103 mmol/L (ref 98–111)
Creatinine, Ser: 0.88 mg/dL (ref 0.44–1.00)
GFR calc Af Amer: >60 mL/min (ref >60)
GFR calc non Af Amer: >60 mL/min (ref >60)
Glucose, Bld: 94 mg/dL (ref 70–99)
Potassium: 4.5 mmol/L (ref 3.5–5.1)
Sodium: 140 mmol/L (ref 135–145)
Total Bilirubin: 0.1 mg/dL — ABNORMAL LOW (ref 0.3–1.2)
Total Protein: 6.9 g/dL (ref 6.5–8.1)

## 2019-09-08 LAB — ABO/RH: ABO/RH(D): O POS

## 2019-09-08 NOTE — Progress Notes (Signed)
PCP - Lucianne Lei, NP Cardiologist - patient denies  PPM/ICD - n/a Device Orders -  Rep Notified -   Chest x-ray - n/a EKG - n/a Stress Test - patient denies ECHO - patient denies Cardiac Cath - patient denies  Sleep Study - patient denies CPAP -   Fasting Blood Sugar - n/a Checks Blood Sugar _____ times a day  Blood Thinner Instructions: n/a Aspirin Instructions: n/a  ERAS Protcol - clears until 05:00 PRE-SURGERY Ensure or G2- none ordered  COVID TEST- scheduled  For 09/11/19   Anesthesia review:   Patient denies shortness of breath, fever, cough and chest pain at PAT appointment   All instructions explained to the patient, with a verbal understanding of the material. Patient agrees to go over the instructions while at home for a better understanding. Patient also instructed to self quarantine after being tested for COVID-19. The opportunity to ask questions was provided.

## 2019-09-10 ENCOUNTER — Encounter (HOSPITAL_COMMUNITY): Payer: Self-pay | Admitting: Psychiatry

## 2019-09-10 ENCOUNTER — Other Ambulatory Visit: Payer: Self-pay

## 2019-09-10 ENCOUNTER — Ambulatory Visit (INDEPENDENT_AMBULATORY_CARE_PROVIDER_SITE_OTHER): Payer: No Typology Code available for payment source | Admitting: Psychiatry

## 2019-09-10 DIAGNOSIS — F4310 Post-traumatic stress disorder, unspecified: Secondary | ICD-10-CM | POA: Diagnosis not present

## 2019-09-10 DIAGNOSIS — F411 Generalized anxiety disorder: Secondary | ICD-10-CM

## 2019-09-10 DIAGNOSIS — F1590 Other stimulant use, unspecified, uncomplicated: Secondary | ICD-10-CM

## 2019-09-10 DIAGNOSIS — F1290 Cannabis use, unspecified, uncomplicated: Secondary | ICD-10-CM

## 2019-09-10 DIAGNOSIS — F332 Major depressive disorder, recurrent severe without psychotic features: Secondary | ICD-10-CM

## 2019-09-10 DIAGNOSIS — Z720 Tobacco use: Secondary | ICD-10-CM

## 2019-09-10 DIAGNOSIS — F1011 Alcohol abuse, in remission: Secondary | ICD-10-CM

## 2019-09-10 DIAGNOSIS — F331 Major depressive disorder, recurrent, moderate: Secondary | ICD-10-CM

## 2019-09-10 DIAGNOSIS — F99 Mental disorder, not otherwise specified: Secondary | ICD-10-CM

## 2019-09-10 DIAGNOSIS — F5105 Insomnia due to other mental disorder: Secondary | ICD-10-CM

## 2019-09-10 MED ORDER — DIVALPROEX SODIUM ER 250 MG PO TB24: 750.0000 mg | ORAL_TABLET | Freq: Every day | ORAL | 0 refills | Status: DC

## 2019-09-10 MED ORDER — VENLAFAXINE HCL ER 75 MG PO CP24: 225.0000 mg | ORAL_CAPSULE | Freq: Every day | ORAL | 0 refills | Status: DC

## 2019-09-10 MED ORDER — PROPRANOLOL HCL 20 MG PO TABS: 20.0000 mg | ORAL_TABLET | Freq: Two times a day (BID) | ORAL | 0 refills | Status: DC | PRN

## 2019-09-10 MED ORDER — MIRTAZAPINE 30 MG PO TABS: 30.0000 mg | ORAL_TABLET | Freq: Every day | ORAL | 0 refills | Status: DC

## 2019-09-10 MED FILL — MIRTAZAPINE 30 MG TABLET: 30 | 90 days supply | Qty: 90 | Fill #0

## 2019-09-10 MED FILL — DIVALPROEX SOD ER 250 MG TA: 250 | 30 days supply | Qty: 90 | Fill #0
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
## 2019-09-11 ENCOUNTER — Other Ambulatory Visit (HOSPITAL_COMMUNITY)
Admission: RE | Admit: 2019-09-11 | Discharge: 2019-09-11 | Disposition: A | Discharge status: Home / Self Care | Payer: No Typology Code available for payment source | Source: Ambulatory Visit | Attending: Obstetrics and Gynecology | Admitting: Obstetrics and Gynecology

## 2019-09-11 DIAGNOSIS — Z01812 Encounter for preprocedural laboratory examination: Secondary | ICD-10-CM | POA: Diagnosis not present

## 2019-09-11 DIAGNOSIS — Z20822 Contact with and (suspected) exposure to covid-19: Secondary | ICD-10-CM | POA: Diagnosis not present

## 2019-09-12 LAB — NOVEL CORONAVIRUS, NAA (HOSP ORDER, SEND-OUT TO REF LAB; TAT 18-24 HRS): SARS-CoV-2, NAA: NOT DETECTED

## 2019-09-14 NOTE — Anesthesia Preprocedure Evaluation (Addendum)
Anesthesia Evaluation  Patient identified by MRN, date of birth, ID band Patient awake    Reviewed: Allergy & Precautions, NPO status , Patient's Chart, lab work & pertinent test results  Airway Mallampati: I  TM Distance: >3 FB Neck ROM: Full    Dental  (+) Teeth Intact, Dental Advisory Given   Pulmonary Current SmokerPatient did not abstain from smoking.,    Pulmonary exam normal breath sounds clear to auscultation       Cardiovascular negative cardio ROS Normal cardiovascular exam Rhythm:Regular Rate:Normal     Neuro/Psych PSYCHIATRIC DISORDERS Anxiety Depression negative neurological ROS     GI/Hepatic negative GI ROS, (+)     substance abuse  marijuana use,   Endo/Other  Obesity   Renal/GU negative Renal ROS     Musculoskeletal negative musculoskeletal ROS (+) narcotic dependent  Abdominal   Peds  Hematology negative hematology ROS (+)   Anesthesia Other Findings Day of surgery medications reviewed with the patient.  Reproductive/Obstetrics                            Anesthesia Physical Anesthesia Plan  ASA: II  Anesthesia Plan: General   Post-op Pain Management:  Regional for Post-op pain   Induction: Intravenous  PONV Risk Score and Plan: 3 and Midazolam, Scopolamine patch - Pre-op, Diphenhydramine, Dexamethasone and Ondansetron  Airway Management Planned: Oral ETT  Additional Equipment:   Intra-op Plan:   Post-operative Plan: Extubation in OR  Informed Consent: I have reviewed the patients History and Physical, chart, labs and discussed the procedure including the risks, benefits and alternatives for the proposed anesthesia with the patient or authorized representative who has indicated his/her understanding and acceptance.     Dental advisory given  Plan Discussed with: CRNA  Anesthesia Plan Comments: (2nd PIV after induction Bilateral TAP blocks in Preop)       Anesthesia Quick Evaluation

## 2019-09-15 ENCOUNTER — Other Ambulatory Visit: Payer: Self-pay

## 2019-09-15 ENCOUNTER — Encounter (HOSPITAL_COMMUNITY)
Admission: RE | Disposition: A | Discharge status: Home / Self Care | Payer: Self-pay | Source: Home / Self Care | Attending: Obstetrics and Gynecology

## 2019-09-15 ENCOUNTER — Encounter (HOSPITAL_COMMUNITY): Payer: Self-pay | Admitting: Obstetrics and Gynecology

## 2019-09-15 ENCOUNTER — Inpatient Hospital Stay (HOSPITAL_COMMUNITY): Payer: No Typology Code available for payment source

## 2019-09-15 ENCOUNTER — Inpatient Hospital Stay (HOSPITAL_COMMUNITY)
Admission: RE | Admit: 2019-09-15 | Discharge: 2019-09-17 | DRG: 743 | Disposition: A | Discharge status: Home / Self Care | Payer: No Typology Code available for payment source | Attending: Obstetrics and Gynecology | Admitting: Obstetrics and Gynecology

## 2019-09-15 DIAGNOSIS — F1290 Cannabis use, unspecified, uncomplicated: Secondary | ICD-10-CM | POA: Diagnosis present

## 2019-09-15 DIAGNOSIS — F329 Major depressive disorder, single episode, unspecified: Secondary | ICD-10-CM | POA: Diagnosis present

## 2019-09-15 DIAGNOSIS — G4700 Insomnia, unspecified: Secondary | ICD-10-CM | POA: Diagnosis present

## 2019-09-15 DIAGNOSIS — D250 Submucous leiomyoma of uterus: Secondary | ICD-10-CM | POA: Diagnosis present

## 2019-09-15 DIAGNOSIS — Z20822 Contact with and (suspected) exposure to covid-19: Secondary | ICD-10-CM | POA: Diagnosis present

## 2019-09-15 DIAGNOSIS — N946 Dysmenorrhea, unspecified: Secondary | ICD-10-CM | POA: Diagnosis present

## 2019-09-15 DIAGNOSIS — F419 Anxiety disorder, unspecified: Secondary | ICD-10-CM | POA: Diagnosis present

## 2019-09-15 DIAGNOSIS — F4310 Post-traumatic stress disorder, unspecified: Secondary | ICD-10-CM | POA: Diagnosis present

## 2019-09-15 DIAGNOSIS — Z9889 Other specified postprocedural states: Secondary | ICD-10-CM

## 2019-09-15 DIAGNOSIS — F17210 Nicotine dependence, cigarettes, uncomplicated: Secondary | ICD-10-CM | POA: Diagnosis present

## 2019-09-15 DIAGNOSIS — D252 Subserosal leiomyoma of uterus: Secondary | ICD-10-CM | POA: Diagnosis present

## 2019-09-15 DIAGNOSIS — Z915 Personal history of self-harm: Secondary | ICD-10-CM | POA: Diagnosis not present

## 2019-09-15 DIAGNOSIS — Z811 Family history of alcohol abuse and dependence: Secondary | ICD-10-CM

## 2019-09-15 HISTORY — PX: MYOMECTOMY: SHX85

## 2019-09-15 HISTORY — DX: Other specified postprocedural states: Z98.890

## 2019-09-15 HISTORY — PX: CHROMOPERTUBATION: SHX6288

## 2019-09-15 LAB — POCT PREGNANCY, URINE: Preg Test, Ur: NEGATIVE

## 2019-09-15 SURGERY — MYOMECTOMY, ABDOMINAL APPROACH
Anesthesia: General | Site: Vagina

## 2019-09-15 MED ORDER — KETOROLAC TROMETHAMINE 30 MG/ML IJ SOLN
INTRAMUSCULAR | Status: AC
  Filled 2019-09-15: qty 1

## 2019-09-15 MED ORDER — ONDANSETRON HCL 4 MG/2ML IJ SOLN
INTRAMUSCULAR | Status: DC | PRN
  Administered 2019-09-15: 4 mg via INTRAVENOUS

## 2019-09-15 MED ORDER — CEFAZOLIN SODIUM-DEXTROSE 2-4 GM/100ML-% IV SOLN
2.0000 g | INTRAVENOUS | Status: AC
  Administered 2019-09-15: 2 g via INTRAVENOUS
  Filled 2019-09-15: qty 100

## 2019-09-15 MED ORDER — ONDANSETRON HCL 4 MG/2ML IJ SOLN
INTRAMUSCULAR | Status: AC
  Filled 2019-09-15: qty 2

## 2019-09-15 MED ORDER — LIDOCAINE HCL 1 % IJ SOLN
INTRAMUSCULAR | Status: AC
  Filled 2019-09-15: qty 20

## 2019-09-15 MED ORDER — PROPRANOLOL HCL 20 MG PO TABS
20.0000 mg | ORAL_TABLET | Freq: Two times a day (BID) | ORAL | Status: DC | PRN
  Administered 2019-09-15 – 2019-09-16 (×3): 20 mg via ORAL
  Filled 2019-09-15 (×4): qty 1

## 2019-09-15 MED ORDER — BUPIVACAINE HCL 0.25 % IJ SOLN
INTRAMUSCULAR | Status: DC | PRN
  Administered 2019-09-15 (×2): 30 mL

## 2019-09-15 MED ORDER — ROCURONIUM BROMIDE 10 MG/ML (PF) SYRINGE
PREFILLED_SYRINGE | INTRAVENOUS | Status: AC
  Filled 2019-09-15: qty 10

## 2019-09-15 MED ORDER — PROMETHAZINE HCL 25 MG/ML IJ SOLN: 6.2500 mg | INTRAMUSCULAR | Status: DC | PRN

## 2019-09-15 MED ORDER — VASOPRESSIN 20 UNIT/ML IV SOLN
INTRAVENOUS | Status: DC | PRN
  Administered 2019-09-15: 16 mL via INTRAMUSCULAR

## 2019-09-15 MED ORDER — LIDOCAINE 2% (20 MG/ML) 5 ML SYRINGE
INTRAMUSCULAR | Status: DC | PRN
  Administered 2019-09-15: 80 mg via INTRAVENOUS

## 2019-09-15 MED ORDER — METHYLENE BLUE 0.5 % INJ SOLN
INTRAVENOUS | Status: DC | PRN
  Administered 2019-09-15: 90 mL
  Administered 2019-09-15: 30 mL

## 2019-09-15 MED ORDER — FENTANYL CITRATE (PF) 250 MCG/5ML IJ SOLN
INTRAMUSCULAR | Status: AC
  Filled 2019-09-15: qty 5

## 2019-09-15 MED ORDER — FENTANYL CITRATE (PF) 100 MCG/2ML IJ SOLN
25.0000 ug | INTRAMUSCULAR | Status: DC | PRN
  Administered 2019-09-15: 50 ug via INTRAVENOUS

## 2019-09-15 MED ORDER — SOD CITRATE-CITRIC ACID 500-334 MG/5ML PO SOLN
30.0000 mL | ORAL | Status: AC
  Administered 2019-09-15: 30 mL via ORAL
  Filled 2019-09-15: qty 30

## 2019-09-15 MED ORDER — DEXAMETHASONE SODIUM PHOSPHATE 10 MG/ML IJ SOLN
INTRAMUSCULAR | Status: DC | PRN
  Administered 2019-09-15: 5 mg via INTRAVENOUS

## 2019-09-15 MED ORDER — DIPHENHYDRAMINE HCL 50 MG/ML IJ SOLN
INTRAMUSCULAR | Status: DC | PRN
  Administered 2019-09-15: 6.25 mg via INTRAVENOUS

## 2019-09-15 MED ORDER — DIVALPROEX SODIUM ER 500 MG PO TB24
750.0000 mg | ORAL_TABLET | Freq: Every day | ORAL | Status: DC
  Administered 2019-09-16 – 2019-09-17 (×2): 750 mg via ORAL
  Filled 2019-09-15 (×2): qty 1

## 2019-09-15 MED ORDER — SODIUM CHLORIDE (PF) 0.9 % IJ SOLN
INTRAMUSCULAR | Status: AC
  Filled 2019-09-15: qty 100

## 2019-09-15 MED ORDER — SENNOSIDES-DOCUSATE SODIUM 8.6-50 MG PO TABS: 1.0000 | ORAL_TABLET | Freq: Every evening | ORAL | Status: DC | PRN

## 2019-09-15 MED ORDER — FENTANYL CITRATE (PF) 100 MCG/2ML IJ SOLN
INTRAMUSCULAR | Status: AC
  Filled 2019-09-15: qty 2

## 2019-09-15 MED ORDER — ACETAMINOPHEN 500 MG PO TABS
1000.0000 mg | ORAL_TABLET | ORAL | Status: AC
  Administered 2019-09-15: 1000 mg via ORAL
  Filled 2019-09-15: qty 2

## 2019-09-15 MED ORDER — BUPIVACAINE HCL (PF) 0.25 % IJ SOLN
INTRAMUSCULAR | Status: AC
  Filled 2019-09-15: qty 30

## 2019-09-15 MED ORDER — LACTATED RINGERS IV SOLN: INTRAVENOUS | Status: DC | PRN

## 2019-09-15 MED ORDER — MIRTAZAPINE 30 MG PO TABS
30.0000 mg | ORAL_TABLET | Freq: Every day | ORAL | Status: DC
  Administered 2019-09-15 – 2019-09-16 (×2): 30 mg via ORAL
  Filled 2019-09-15 (×2): qty 1
  Filled 2019-09-15 (×2): qty 2

## 2019-09-15 MED ORDER — KETOROLAC TROMETHAMINE 30 MG/ML IJ SOLN
30.0000 mg | Freq: Once | INTRAMUSCULAR | Status: AC
  Administered 2019-09-15: 30 mg via INTRAVENOUS

## 2019-09-15 MED ORDER — MIDAZOLAM HCL 5 MG/5ML IJ SOLN
INTRAMUSCULAR | Status: DC | PRN
  Administered 2019-09-15: 2 mg via INTRAVENOUS

## 2019-09-15 MED ORDER — DEXMEDETOMIDINE HCL 200 MCG/2ML IV SOLN
INTRAVENOUS | Status: DC | PRN
  Administered 2019-09-15 (×2): 8 ug via INTRAVENOUS

## 2019-09-15 MED ORDER — FENTANYL CITRATE (PF) 100 MCG/2ML IJ SOLN
INTRAMUSCULAR | Status: DC | PRN
  Administered 2019-09-15 (×3): 50 ug via INTRAVENOUS
  Administered 2019-09-15: 100 ug via INTRAVENOUS

## 2019-09-15 MED ORDER — VENLAFAXINE HCL ER 75 MG PO CP24
225.0000 mg | ORAL_CAPSULE | Freq: Every day | ORAL | Status: DC
  Administered 2019-09-16 – 2019-09-17 (×2): 225 mg via ORAL
  Filled 2019-09-15 (×3): qty 1

## 2019-09-15 MED ORDER — PROPOFOL 10 MG/ML IV BOLUS
INTRAVENOUS | Status: DC | PRN
  Administered 2019-09-15: 20 mg via INTRAVENOUS
  Administered 2019-09-15: 170 mg via INTRAVENOUS

## 2019-09-15 MED ORDER — LIDOCAINE 2% (20 MG/ML) 5 ML SYRINGE
INTRAMUSCULAR | Status: AC
  Filled 2019-09-15: qty 5

## 2019-09-15 MED ORDER — SCOPOLAMINE 1 MG/3DAYS TD PT72
MEDICATED_PATCH | TRANSDERMAL | Status: DC | PRN
  Administered 2019-09-15: 1 via TRANSDERMAL

## 2019-09-15 MED ORDER — VASOPRESSIN 20 UNIT/ML IV SOLN
INTRAVENOUS | Status: AC
  Filled 2019-09-15: qty 1

## 2019-09-15 MED ORDER — BUPIVACAINE HCL 0.25 % IJ SOLN: INTRAMUSCULAR | Status: DC | PRN

## 2019-09-15 MED ORDER — PROPOFOL 10 MG/ML IV BOLUS
INTRAVENOUS | Status: AC
  Filled 2019-09-15: qty 20

## 2019-09-15 MED ORDER — OXYCODONE HCL 5 MG PO TABS
5.0000 mg | ORAL_TABLET | ORAL | Status: DC | PRN
  Administered 2019-09-15 – 2019-09-17 (×8): 10 mg via ORAL
  Filled 2019-09-15 (×8): qty 2

## 2019-09-15 MED ORDER — IBUPROFEN 600 MG PO TABS
600.0000 mg | ORAL_TABLET | Freq: Four times a day (QID) | ORAL | Status: DC | PRN
  Administered 2019-09-15 – 2019-09-17 (×4): 600 mg via ORAL
  Filled 2019-09-15 (×4): qty 1

## 2019-09-15 MED ORDER — DEXAMETHASONE SODIUM PHOSPHATE 10 MG/ML IJ SOLN
INTRAMUSCULAR | Status: AC
  Filled 2019-09-15: qty 1

## 2019-09-15 MED ORDER — ONDANSETRON HCL 4 MG PO TABS: 4.0000 mg | ORAL_TABLET | Freq: Four times a day (QID) | ORAL | Status: DC | PRN

## 2019-09-15 MED ORDER — ONDANSETRON HCL 4 MG/2ML IJ SOLN: 4.0000 mg | Freq: Four times a day (QID) | INTRAMUSCULAR | Status: DC | PRN

## 2019-09-15 MED ORDER — LACTATED RINGERS IV SOLN: INTRAVENOUS | Status: DC

## 2019-09-15 MED ORDER — MIDAZOLAM HCL 2 MG/2ML IJ SOLN
INTRAMUSCULAR | Status: AC
  Filled 2019-09-15: qty 2

## 2019-09-15 MED ORDER — PANTOPRAZOLE SODIUM 40 MG PO TBEC
40.0000 mg | DELAYED_RELEASE_TABLET | Freq: Every day | ORAL | Status: DC
  Administered 2019-09-15 – 2019-09-17 (×3): 40 mg via ORAL
  Filled 2019-09-15 (×3): qty 1

## 2019-09-15 MED ORDER — ROCURONIUM BROMIDE 50 MG/5ML IV SOSY
PREFILLED_SYRINGE | INTRAVENOUS | Status: DC | PRN
  Administered 2019-09-15: 30 mg via INTRAVENOUS
  Administered 2019-09-15: 50 mg via INTRAVENOUS

## 2019-09-15 MED ORDER — SIMETHICONE 80 MG PO CHEW: 80.0000 mg | CHEWABLE_TABLET | Freq: Four times a day (QID) | ORAL | Status: DC | PRN

## 2019-09-15 MED ORDER — DIPHENHYDRAMINE HCL 50 MG/ML IJ SOLN
INTRAMUSCULAR | Status: AC
  Filled 2019-09-15: qty 1

## 2019-09-15 MED ORDER — BUPIVACAINE HCL 0.5 % IJ SOLN
INTRAMUSCULAR | Status: DC | PRN
  Administered 2019-09-15: 10 mL

## 2019-09-15 MED ORDER — SUGAMMADEX SODIUM 200 MG/2ML IV SOLN
INTRAVENOUS | Status: DC | PRN
  Administered 2019-09-15: 150 mg via INTRAVENOUS

## 2019-09-15 MED ORDER — METHYLENE BLUE 0.5 % INJ SOLN
INTRAVENOUS | Status: AC
  Filled 2019-09-15: qty 10

## 2019-09-15 SURGICAL SUPPLY — 45 items
APL SKNCLS STERI-STRIP NONHPOA (GAUZE/BANDAGES/DRESSINGS) ×2
BARRIER ADHS 3X4 INTERCEED (GAUZE/BANDAGES/DRESSINGS) ×2 IMPLANT
BENZOIN TINCTURE PRP APPL 2/3 (GAUZE/BANDAGES/DRESSINGS) ×2 IMPLANT
BRR ADH 4X3 ABS CNTRL BYND (GAUZE/BANDAGES/DRESSINGS) ×2
CANISTER SUCT 3000ML PPV (MISCELLANEOUS) ×4 IMPLANT
CELLS DAT CNTRL 66122 CELL SVR (MISCELLANEOUS) ×2 IMPLANT
CLOSURE WOUND 1/2 X4 (GAUZE/BANDAGES/DRESSINGS) ×1
DECANTER SPIKE VIAL GLASS SM (MISCELLANEOUS) IMPLANT
DRAPE CESAREAN BIRTH W POUCH (DRAPES) ×4 IMPLANT
DRAPE WARM FLUID 44X44 (DRAPES) ×2 IMPLANT
DRSG OPSITE POSTOP 4X10 (GAUZE/BANDAGES/DRESSINGS) ×2 IMPLANT
DURAPREP 26ML APPLICATOR (WOUND CARE) ×4 IMPLANT
GAUZE 4X4 16PLY RFD (DISPOSABLE) ×4 IMPLANT
GLOVE BIO SURGEON STRL SZ 6.5 (GLOVE) ×3 IMPLANT
GLOVE BIO SURGEONS STRL SZ 6.5 (GLOVE) ×1
GLOVE BIOGEL PI IND STRL 7.0 (GLOVE) ×4 IMPLANT
GLOVE BIOGEL PI INDICATOR 7.0 (GLOVE) ×4
GOWN STRL REUS W/ TWL LRG LVL3 (GOWN DISPOSABLE) ×6 IMPLANT
GOWN STRL REUS W/TWL LRG LVL3 (GOWN DISPOSABLE) ×12
KIT TURNOVER KIT B (KITS) ×4 IMPLANT
MANIPULATOR UTERINE 4.5 ZUMI (MISCELLANEOUS) ×4 IMPLANT
NEEDLE HYPO 22GX1.5 SAFETY (NEEDLE) ×2 IMPLANT
NS IRRIG 1000ML POUR BTL (IV SOLUTION) ×4 IMPLANT
PACK ABDOMINAL GYN (CUSTOM PROCEDURE TRAY) ×4 IMPLANT
PAD ARMBOARD 7.5X6 YLW CONV (MISCELLANEOUS) ×4 IMPLANT
PAD OB MATERNITY 4.3X12.25 (PERSONAL CARE ITEMS) ×4 IMPLANT
RETRACTOR WND ALEXIS 18 MED (MISCELLANEOUS) IMPLANT
RTRCTR C-SECT PINK 25CM LRG (MISCELLANEOUS) IMPLANT
RTRCTR WOUND ALEXIS 18CM MED (MISCELLANEOUS) ×4
SPONGE LAP 18X18 RF (DISPOSABLE) ×4 IMPLANT
STAPLER VISISTAT 35W (STAPLE) ×4 IMPLANT
STRIP CLOSURE SKIN 1/2X4 (GAUZE/BANDAGES/DRESSINGS) ×1 IMPLANT
SUT MON AB 2-0 CT1 36 (SUTURE) ×4 IMPLANT
SUT PDS AB 0 CTX 60 (SUTURE) IMPLANT
SUT VIC AB 0 CT1 18XCR BRD8 (SUTURE) IMPLANT
SUT VIC AB 0 CT1 36 (SUTURE) IMPLANT
SUT VIC AB 0 CT1 8-18 (SUTURE) ×4
SUT VIC AB 2-0 UR6 27 (SUTURE) ×4 IMPLANT
SUT VIC AB 4-0 SH 27 (SUTURE) ×4
SUT VIC AB 4-0 SH 27XBRD (SUTURE) IMPLANT
SUT VICRYL 0 TIES 12 18 (SUTURE) ×2 IMPLANT
SYR 50ML LL SCALE MARK (SYRINGE) ×4 IMPLANT
SYR CONTROL 10ML LL (SYRINGE) ×2 IMPLANT
TOWEL GREEN STERILE FF (TOWEL DISPOSABLE) ×8 IMPLANT
TRAY FOLEY W/BAG SLVR 14FR (SET/KITS/TRAYS/PACK) ×4 IMPLANT

## 2019-09-15 NOTE — Progress Notes (Signed)
Patient arrived from PACU to room 6N25. Patient is drowsy but easily arousable. VSS. NAD. Patient is oriented to unit with call bell and belongings within reach. Patient c/o pain and PRN medication is administered. Honeycomb dressing in place to lower abdomen with scant serous drainage on bandage. Bed is in the lowest and locked position with bed rails up times 3. Patient's mother aware of patient's arrival to the unit.

## 2019-09-15 NOTE — Anesthesia Postprocedure Evaluation (Signed)
Anesthesia Post Note  Patient: KIMBERLYN LOFTUS  Procedure(s) Performed: ABDOMINAL MYOMECTOMY (N/A Abdomen) CHROMOPERTUBATION (N/A Vagina )     Patient location during evaluation: PACU Anesthesia Type: General Level of consciousness: awake and alert Pain management: pain level controlled Vital Signs Assessment: post-procedure vital signs reviewed and stable Respiratory status: spontaneous breathing, nonlabored ventilation and respiratory function stable Cardiovascular status: blood pressure returned to baseline and stable Postop Assessment: no apparent nausea or vomiting Anesthetic complications: no    Last Vitals:  Vitals:   09/15/19 0634 09/15/19 1042  BP: 140/89 123/69  Pulse: 76 69  Resp: 18 17  Temp: 36.9 C 36.4 C  SpO2: 100% 92%    Last Pain:  Vitals:   09/15/19 1042  TempSrc:   PainSc: Asleep                 Catalina Gravel

## 2019-09-15 NOTE — Anesthesia Procedure Notes (Signed)
Anesthesia Regional Block: TAP block   Pre-Anesthetic Checklist: ,, timeout performed, Correct Patient, Correct Site, Correct Laterality, Correct Procedure, Correct Position, site marked, Risks and benefits discussed,  Surgical consent,  Pre-op evaluation,  At surgeon's request and post-op pain management  Laterality: Left  Prep: chloraprep       Needles:  Injection technique: Single-shot  Needle Type: Echogenic Needle     Needle Length: 9cm  Needle Gauge: 21     Additional Needles:   Procedures:,,,, ultrasound used (permanent image in chart),,,,  Narrative:  Start time: 09/15/2019 7:08 AM End time: 09/15/2019 7:10 AM Injection made incrementally with aspirations every 5 mL.  Performed by: Personally  Anesthesiologist: Catalina Gravel, MD  Additional Notes: No pain on injection. No increased resistance to injection. Injection made in 5cc increments.  Good needle visualization.  Patient tolerated procedure well.

## 2019-09-15 NOTE — Anesthesia Procedure Notes (Deleted)
Performed by: Myli Pae B, CRNA       

## 2019-09-15 NOTE — Interval H&P Note (Signed)
History and Physical Interval Note:  Pt doing well. Reviewed procedure and expectations once more. All questions answered Post op course and expectations discussed.  To OR when ready for abdominal myomectomy with possible chromopertubation   09/15/2019 7:54 AM  [name]  has presented today for surgery, with the diagnosis of pain in pelvis R10.2, Uterine Leiomyoma D25.2.  The various methods of treatment have been discussed with the patient and family. After consideration of risks, benefits and other options for treatment, the patient has consented to  Procedure(s) with comments: ABDOMINAL MYOMECTOMY (N/A) POSSIBLE CHROMOPERTUBATION (N/A) - possible as a surgical intervention.  The patient's history has been reviewed, patient examined, no change in status, stable for surgery.  I have reviewed the patient's chart and labs.  Questions were answered to the patient's satisfaction.     Amber Craig

## 2019-09-15 NOTE — Op Note (Addendum)
Operative Note    Preoperative Diagnosis: 1. Large fundal fibroids                                             2. S/P lupron                                             3. Desires to preserve fertility   Postoperative Diagnosis: Same   Procedure: Abdominal myomectomy with chromopertubation   Surgeon: Mickle Mallory DO Assist: Bovard-Stuckert, J MD  Anesthesia: Bilateral tap block with general anesthesia   Fluids: LR 144ml EBL: 121ml UOP: 54ml   Findings: Two lage fundal subserosal fibroids measuring approx 5cm and 7-9cm; multiple small fibroids in submucosa. <0.5cm opening into endometrium   Specimen: Uterine fibroids to pathology   Procedure Note Pt was taken to operating room. She was placed in the dorsal lithotomy position with arms outstretched on table. She was administered with general anesthesia and a bilateral tap block. A foley catheter was sterilely placed. Pt's abdomen was then prepped and draped in sterile fashion as well and an appropriate timeout performed. A rumi uterine manipulator was placed vaginally  Next, a pfannestial skin incision was made with the scalpel and carried down to the level of the fascia. Small areas of bleeding were coagulated.The fascia was incised in the midline and the incision extended laterally with mayo scissors first undermining the fascia. Next, the superior aspect of the fascia was elevated with kocher clamps and the underlying rectus muscles dissected away with sharp dissection due to scarring. In a similar fashion the lower aspect of the incision was also grasped, elevated and the fascia dissected from the rectus. The peritoneum was then entered and the incision was extended cephalad and caudad  The medium sized alexis retractor was then placed and bowels tucked away with wet lap sponges to ensure good visualization.  30ccs of dye were injected through the uterine manipulator to stain the endometrial cavity for good visualization   A  large subserosal  fibroid was noted in the mid fundal area of the uterus. A mixture of 1: 50u pitressin was injected at the base and overlying the mass. An incision was made over the fibroid and it was dissected away using a combination of sharp dissection and traction. Once freed, a larger fibroid was noted to the left cornual region of the uterus. More pitressin was injected and the 7-9cm fibroid excised in a similar fashion. There were 3-4 smaller fibroids noted; one in the posterior uterus, and the others in the submucosal tissue. These were easily removed. At this point a small opening was noted into the endometrium measuring about 0.5cm. This was closed with 4-0 monocril on an SH needle in two layers.  Next, the uterus was closed in 4 layers: first with a 4-0 vicryl to close the deeper spaces then with a 2-0 monocryl to ensure closure of last two layer of myometrium. The serosa was finally closed in a baseball stitch pattern also using 2-0 monocryl. Excellent hemostasis was noted.   A chromopertubation was performed. There was eviceration from the uterine repair and only staining of the cornual aspects of both fallopian tubes.  Interceed was placed over the serosal repair sites. All sponges and the [employer]  were then removed.   A final look into the abdominal cavity confirmed no abnormalities or bleeding hence peritoneum was closed next in a running fashion with 2-0 vicryl, the fascia layer was closed using 0- vicryl also in a running fashion from both ends. The subcuticular layer was undermined, irrigated and reapproximated using interrupted 3-0 vicryl stitches and finally the incision was closed using 4-0 vicryl on a keith needle. A honey comb dressing with pressure was applied. Counts were noted to be correct. Patient was awakened and taken to recovery room in stable status. She tolerated the procedure well.  Foley left in place

## 2019-09-15 NOTE — Anesthesia Procedure Notes (Signed)
Procedure Name: Intubation Date/Time: 09/15/2019 8:10 AM Performed by: Griffin Dakin, CRNA Pre-anesthesia Checklist: Patient identified, Emergency Drugs available, Suction available and Patient being monitored Patient Re-evaluated:Patient Re-evaluated prior to induction Oxygen Delivery Method: Circle system utilized Preoxygenation: Pre-oxygenation with 100% oxygen Induction Type: IV induction Ventilation: Mask ventilation without difficulty Laryngoscope Size: Mac and 3 Grade View: Grade I Tube type: Oral Tube size: 7.0 mm Number of attempts: 1 Airway Equipment and Method: Stylet and Oral airway Placement Confirmation: ETT inserted through vocal cords under direct vision,  positive ETCO2 and breath sounds checked- equal and bilateral Secured at: 21 cm Tube secured with: Tape Dental Injury: Teeth and Oropharynx as per pre-operative assessment  Comments: Inserted by Paulina Fusi, SRNA

## 2019-09-15 NOTE — Anesthesia Procedure Notes (Signed)
Anesthesia Regional Block: TAP block   Pre-Anesthetic Checklist: ,, timeout performed, Correct Patient, Correct Site, Correct Laterality, Correct Procedure, Correct Position, site marked, Risks and benefits discussed,  Surgical consent,  Pre-op evaluation,  At surgeon's request and post-op pain management  Laterality: Right  Prep: chloraprep       Needles:  Injection technique: Single-shot  Needle Type: Echogenic Needle     Needle Length: 9cm  Needle Gauge: 21     Additional Needles:   Procedures:,,,, ultrasound used (permanent image in chart),,,,  Narrative:  Start time: 09/15/2019 7:10 AM End time: 09/15/2019 7:15 AM Injection made incrementally with aspirations every 5 mL.  Performed by: Personally  Anesthesiologist: Catalina Gravel, MD  Additional Notes: No pain on injection. No increased resistance to injection. Injection made in 5cc increments.  Good needle visualization.  Patient tolerated procedure well.

## 2019-09-15 NOTE — Transfer of Care (Signed)
Immediate Anesthesia Transfer of Care Note  Patient: Amber Craig  Procedure(s) Performed: ABDOMINAL MYOMECTOMY (N/A Abdomen) CHROMOPERTUBATION (N/A Vagina )  Patient Location: PACU  Anesthesia Type:General  Level of Consciousness: awake and pateint uncooperative  Airway & Oxygen Therapy: Patient Spontanous Breathing  Post-op Assessment: Report given to RN and Post -op Vital signs reviewed and stable  Post vital signs: Reviewed and stable  Last Vitals:  Vitals Value Taken Time  BP    Temp    Pulse 69 09/15/19 1044  Resp 17 09/15/19 1044  SpO2 92 % 09/15/19 1044  Vitals shown include unvalidated device data.  Last Pain:  Vitals:   09/15/19 0640  TempSrc:   PainSc: 5       Patients Stated Pain Goal: 2 ([id number])  Complications: No apparent anesthesia complications

## 2019-09-15 NOTE — Progress Notes (Signed)
Patient ID: Amber Craig, female   DOB: 01-Jul-1981, 39 y.o.   MRN: [id number] POD#0 Pt doing well. Pain well controlled with medication. Has ambulated in room. Foley in place - requests to have removed. She denies nausea or lightheadedness VSS GEN - NAD ABD - dressing with scant old blood on left corner, abdomen mildly tender, nondistended EXT - no homans  A/P: POD#0 s/p abdominal myomectomy with chromopertubation - stable         Routine post op care         Remove foley catheter         Consider discharge to home in am

## 2019-09-16 LAB — CBC
HCT: 37.7 % (ref 36.0–46.0)
Hemoglobin: 11.7 g/dL — ABNORMAL LOW (ref 12.0–15.0)
MCH: 28 pg (ref 26.0–34.0)
MCHC: 31 g/dL (ref 30.0–36.0)
MCV: 90.2 fL (ref 80.0–100.0)
Platelets: 194 10*3/uL (ref 150–400)
RBC: 4.18 MIL/uL (ref 3.87–5.11)
RDW: 14.3 % (ref 11.5–15.5)
WBC: 9.6 10*3/uL (ref 4.0–10.5)
nRBC: 0 % (ref 0.0–0.2)

## 2019-09-16 LAB — SURGICAL PATHOLOGY

## 2019-09-16 NOTE — Progress Notes (Signed)
Patient ID: Amber Craig, female   DOB: [birth year], 39 y.o.   MRN: [id number] Pt doing well ambulating up/down hallways, voiding after foley removed and tolerating diet. She reports mild SOB with turning and only moderate pain control. Pain well controlled with standing and sitting but when moves, it increases VSS GEN - NAD ABD - Soft, ND, dressing and old scant blood at left corner  EXT - no homans   A/P: 9.6>11.7<194  A/P: POD #1 s/p abdominal myomectomy with chromopertubation - stable         Pt needing better pain control: monitor overnight; abdominal binder          Plan discharge to home tomorrow

## 2019-09-16 NOTE — Discharge Instructions (Signed)
Call office with any concerns [phone] 

## 2019-09-16 NOTE — Progress Notes (Signed)
Pt ambulated x2 in hallway.

## 2019-09-16 NOTE — Plan of Care (Signed)
  Problem: Clinical Measurements: Goal: Ability to maintain clinical measurements within normal limits will improve Outcome: Progressing Goal: Postoperative complications will be avoided or minimized Outcome: Progressing   Problem: Skin Integrity: Goal: Demonstration of wound healing without infection will improve Outcome: Progressing   

## 2019-09-16 NOTE — Progress Notes (Signed)
Orthopedic Tech Progress Note Patient Details:  Amber Craig 1981/07/07 [id number]  Ortho Devices Type of Ortho Device: Abdominal binder Ortho Device/Splint Interventions: Ordered, Application, Adjustment   Post Interventions Patient Tolerated: Well Instructions Provided: Care of device, Adjustment of device   Karolee Stamps 09/16/2019, 12:48 PM

## 2019-09-17 MED ORDER — IBUPROFEN 800 MG PO TABS: 800.0000 mg | ORAL_TABLET | Freq: Three times a day (TID) | ORAL | 1 refills | Status: DC | PRN

## 2019-09-17 MED ORDER — OXYCODONE-ACETAMINOPHEN 5-325 MG PO TABS: 1.0000 | ORAL_TABLET | ORAL | 0 refills | Status: AC | PRN

## 2019-09-17 MED FILL — IBUPROFEN 800 MG TAB: 800 | 10 days supply | Qty: 30 | Fill #0

## 2019-09-17 MED FILL — OXYCODONE-APAP 5-325MG: 5-325 | 5 days supply | Qty: 30 | Fill #0

## 2019-09-17 NOTE — Progress Notes (Signed)
Pt ready for DC, sister is going to pick pt up.  AVS reviewed, copy given and Rx given.

## 2019-09-17 NOTE — Discharge Summary (Signed)
Physician Discharge Summary  Patient ID: Amber Craig MRN: [id number] DOB/AGE: 1981-05-06 39 y.o.  Admit date: 09/15/2019 Discharge date: 09/17/2019  Admission Diagnoses: Uterine fibroids Discharge Diagnoses: S/P abdominal myomectomy with chromopertubation Active Problems:   Status post myomectomy   S/P myomectomy   Discharged Condition: stable  Hospital Course: Pt recovered well with adequate pain control by POD #2  Consults: None  Significant Diagnostic Studies: labs: cbc wnl  Treatments: IV hydration and analgesia: oxycodone  Discharge Exam: Blood pressure 113/75, pulse 64, temperature 98.3 F (36.8 C), temperature source Oral, resp. rate 20, height 5\' 4"  (1.626 m), weight 81.9 kg, SpO2 100 %. General appearance: alert, cooperative and appears stated age GI: soft, non-tender; bowel sounds normal; no masses,  no organomegaly Extremities: extremities normal, atraumatic, no cyanosis or edema  Disposition: Discharge disposition: 01-Home or Self Care       Discharge Instructions     Remove dressing in 72 hours   Complete by: As directed    May shower, pat dry   Call MD for:  difficulty breathing, headache or visual disturbances   Complete by: As directed    Call MD for:  persistant dizziness or light-headedness   Complete by: As directed    Call MD for:  persistant nausea and vomiting   Complete by: As directed    Call MD for:  redness, tenderness, or signs of infection (pain, swelling, redness, odor or green/yellow discharge around incision site)   Complete by: As directed    Call MD for:  severe uncontrolled pain   Complete by: As directed    Call MD for:  temperature >100.4   Complete by: As directed    Diet - low sodium heart healthy   Complete by: As directed    Discharge instructions   Complete by: As directed    Call office with any concerns [phone]   Driving Restrictions   Complete by: As directed    None while taking narcotics   Increase  activity slowly   Complete by: As directed    Lifting restrictions   Complete by: As directed    <15lbs   Sexual Activity Restrictions   Complete by: As directed    None for 6 weeks     Allergies as of 09/17/2019   No Known Allergies     Medication List    TAKE these medications   divalproex 250 MG 24 hr tablet Commonly known as: DEPAKOTE ER Take 3 tablets (750 mg total) by mouth daily.   ibuprofen 800 MG tablet Commonly known as: ADVIL Take 1 tablet (800 mg total) by mouth every 8 (eight) hours as needed for moderate pain or cramping. What changed: reasons to take this   methocarbamol 500 MG tablet Commonly known as: ROBAXIN Take 1 tablet (500 mg total) by mouth every 8 (eight) hours as needed for muscle spasms.   mirtazapine 30 MG tablet Commonly known as: REMERON Take 1 tablet (30 mg total) by mouth at bedtime.   oxyCODONE-acetaminophen 5-325 MG tablet Commonly known as: Percocet Take 1 tablet by mouth every 4 (four) hours as needed for up to 7 days for severe pain.   propranolol 20 MG tablet Commonly known as: INDERAL Take 1 tablet (20 mg total) by mouth 2 (two) times daily as needed (anxiety).   venlafaxine XR 75 MG 24 hr capsule Commonly known as: EFFEXOR-XR Take 3 capsules (225 mg total) by mouth daily.      Follow-up Information  [place], Hazel, DO. Call.   Specialty: Obstetrics and Gynecology Why: 2 weeks for incision check and 6 weeks for post op visit Contact information: [address] [place] [address] [phone]           Signed: Isaiah Serge 09/17/2019, 7:58 AM

## 2019-09-17 NOTE — Plan of Care (Signed)
Pt for discharge today ,alert and oriented, ambulatory, tolerates her meal, no bleeding on wound abdominal site, given all her personal belongings, discontinued peripheral IV line, given health teachings, next appointment and due med explained and understood, waiting for her family to pick her up.

## 2019-09-17 NOTE — Progress Notes (Signed)
Patient ID: Amber Craig, female   DOB: 1981/04/09, 39 y.o.   MRN: [id number] Pt doing well. Pain better controlled with medication. Some fatigue. Denies CP, SOB, fever or nausea. Ready for discharge to home today VSS GEN - NAD ABD - soft, dressing in place; no further drainage EXT - no homans   A/P: POD #2 s/p abdominal myomectomy - stable        Discharge instructions reviewed

## 2019-09-18 ENCOUNTER — Other Ambulatory Visit: Payer: Self-pay | Admitting: *Deleted

## 2019-09-18 NOTE — Patient Outreach (Signed)
[place] [facility]) Care Management  09/18/2019  Amber Craig Jul 08, 1981 [id number]  Transition of care telephone call  Referral received:09/16/19 Initial outreach:09/18/19 Insurance: [place]  Initial unsuccessful telephone call to patient's preferred number in order to complete transition of care assessment; no answer,no voicemail pickup  unable to leave a message for return call  .   Objective: Per the electronic medical record, Amber Craig   was hospitalized at [facility] from 1/19-1/21   With/for Abdominal Myomectomy  . Comorbidities include:Uterine Fibroids, Depression, PTSD  She was discharged to home on 09/17/19  without the need for home health services or durable medical equipment per the discharge summary.   Plan: This RNCM will route unsuccessful outreach letter with [place] Management pamphlet and 24 hour Nurse Advice Line Magnet to [place] Management clinical pool to be mailed to patient's home address. This RNCM will attempt another outreach within 4 business days.  Joylene Draft, RN, BSN  [place] Management Coordinator  [phone]- Mobile [phone]- Toll Free Main Office

## 2019-09-23 ENCOUNTER — Other Ambulatory Visit: Payer: Self-pay | Admitting: *Deleted

## 2019-09-23 NOTE — Patient Outreach (Signed)
[facility]) Care Management  09/23/2019  Amber Craig 02-15-1981 [id number]   Transition of care call Referral received: 09/16/19 Initial outreach attempt: 09/18/19 Insurance: [place]    2nd unsuccessful telephone call to patient's preferred contact number in order to complete post hospital discharge transition of care assessment , no answer , no voicemail pickup unable to leave a message.     Objective: Per the electronic medical record, Amber Craig   was hospitalized at [facility] from 1/19-1/21 for  Abdominal Myomectomy  . Comorbidities include:Uterine Fibroids, Depression, PTSD  She was discharged to home on 09/17/19  without the need for home health services or durable medical equipment per the discharge summary.    Plan If no return call from patient will attempt 3rd outreach in the next 4 business days.    Joylene Draft, RN, BSN  [place] Management Coordinator  [phone]- Mobile [phone]- Toll Free Main Office

## 2019-09-28 ENCOUNTER — Other Ambulatory Visit: Payer: Self-pay | Admitting: *Deleted

## 2019-09-28 NOTE — Patient Outreach (Signed)
[place] [facility]) Care Management  09/28/2019  Amber Craig 06/01/1981 [id number]   Transition of care call Referral received: 09/16/19 Initial outreach attempt: 09/18/19 Insurance: [place] unsuccessful telephone call to patient's preferred contact number in order to complete post hospital discharge transition of care assessment; no answer, no voice mail pick up unable to leave a message. .   Objective: Per the electronic medical record,Amber [facility]1/[facility] for  Abdominal Myomectomy. Comorbidities include:Uterine Fibroids, Depression, PTSD Shewas discharged to home on 1/21/21without the need for home health services or durable medical equipment per the discharge summary  Plan: If no return call from patient, will close case to [place] Management services in 10 business days after initial post hospital discharge outreach, on 09/18/19.   Joylene Draft, RN, BSN  [place] Management Coordinator  [phone]- Mobile [phone]- Toll Free Main Office

## 2019-10-01 ENCOUNTER — Other Ambulatory Visit: Payer: Self-pay | Admitting: *Deleted

## 2019-10-01 ENCOUNTER — Other Ambulatory Visit: Payer: Self-pay

## 2019-10-01 ENCOUNTER — Ambulatory Visit: Payer: Self-pay | Admitting: *Deleted

## 2019-10-01 ENCOUNTER — Ambulatory Visit (INDEPENDENT_AMBULATORY_CARE_PROVIDER_SITE_OTHER): Payer: No Typology Code available for payment source | Admitting: Psychiatry

## 2019-10-01 ENCOUNTER — Encounter (HOSPITAL_COMMUNITY): Payer: Self-pay | Admitting: Psychiatry

## 2019-10-01 DIAGNOSIS — F99 Mental disorder, not otherwise specified: Secondary | ICD-10-CM

## 2019-10-01 DIAGNOSIS — F332 Major depressive disorder, recurrent severe without psychotic features: Secondary | ICD-10-CM

## 2019-10-01 DIAGNOSIS — F411 Generalized anxiety disorder: Secondary | ICD-10-CM

## 2019-10-01 DIAGNOSIS — F4310 Post-traumatic stress disorder, unspecified: Secondary | ICD-10-CM | POA: Diagnosis not present

## 2019-10-01 DIAGNOSIS — F5105 Insomnia due to other mental disorder: Secondary | ICD-10-CM | POA: Diagnosis not present

## 2019-10-01 MED ORDER — CLONIDINE HCL 0.1 MG PO TABS: 0.1000 mg | ORAL_TABLET | Freq: Two times a day (BID) | ORAL | 1 refills | Status: DC | PRN

## 2019-10-01 MED ORDER — VENLAFAXINE HCL ER 75 MG PO CP24: 225.0000 mg | ORAL_CAPSULE | Freq: Every day | ORAL | 0 refills | Status: DC

## 2019-10-01 MED ORDER — MIRTAZAPINE 45 MG PO TABS: 45.0000 mg | ORAL_TABLET | Freq: Every day | ORAL | 0 refills | Status: DC
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
## 2019-10-01 NOTE — Patient Outreach (Signed)
[name] Care Management  10/01/2019  Amber ORAN June 14, 1981 [id number]   Case Closure   Transition of care /Case Closure Unsuccessful outreach    Referral received:09/16/19 Initial outreach:09/18/19 Insurance: [place]   Unable to complete post hospital discharge transition of care assessment. No return call form patient after 3 call attempts and no response to request to contact RN Care Coordinator in unsuccessful outreach letter mailed to home on 09/18/19.  Objective: Per the electronic medical record, Amber Craig   was hospitalized at [facility] from 1/19-1/21   With/for Abdominal Myomectomy  . Comorbidities include:Uterine Fibroids, Depression, PTSD  She was discharged to home on 09/17/19  without the need for home health services or durable medical equipment per the discharge summary.    Plan Case closed to [employer] as it has been 10 days since initial post discharge outreach attempt.   Joylene Draft, RN, BSN  [place] Management Coordinator  [phone]- Mobile [phone]- Toll Free Main Office

## 2019-10-02 MED FILL — CloNIDine HCL 0.1 MG TAB: 0.1 | 30 days supply | Qty: 60 | Fill #0

## 2019-10-02 MED FILL — MIRTAZAPINE 45 MG TABLET: 45 | 90 days supply | Qty: 90 | Fill #0

## 2019-10-14 IMAGING — CR DG FOOT COMPLETE 3+V*R*
3 series · 3 of 3 positions shown · non-contrast
Comparison: None.

CLINICAL DATA: C/o acute pain across RIGHT MTs x 3 days / no trauma
/ pain radiates to lower anterior tibia / concern for stress FX /
jdh 315

EXAM:
RIGHT FOOT COMPLETE - 3+ VIEW

[x foot ap right]
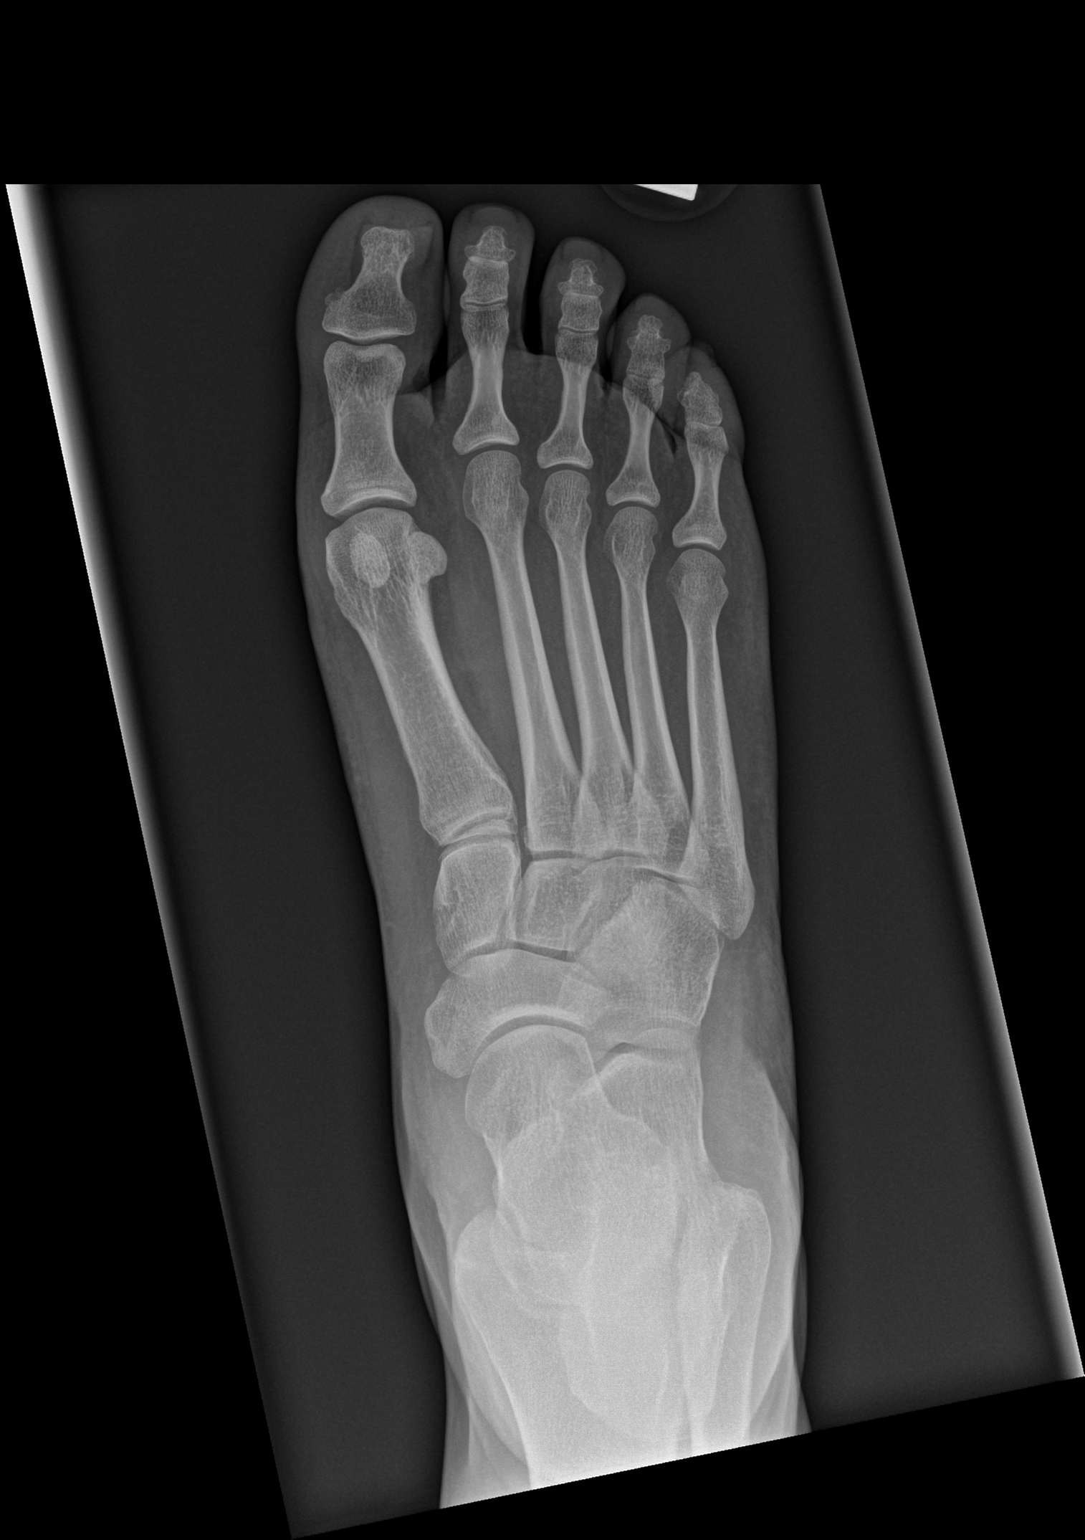

[x foot obl right]
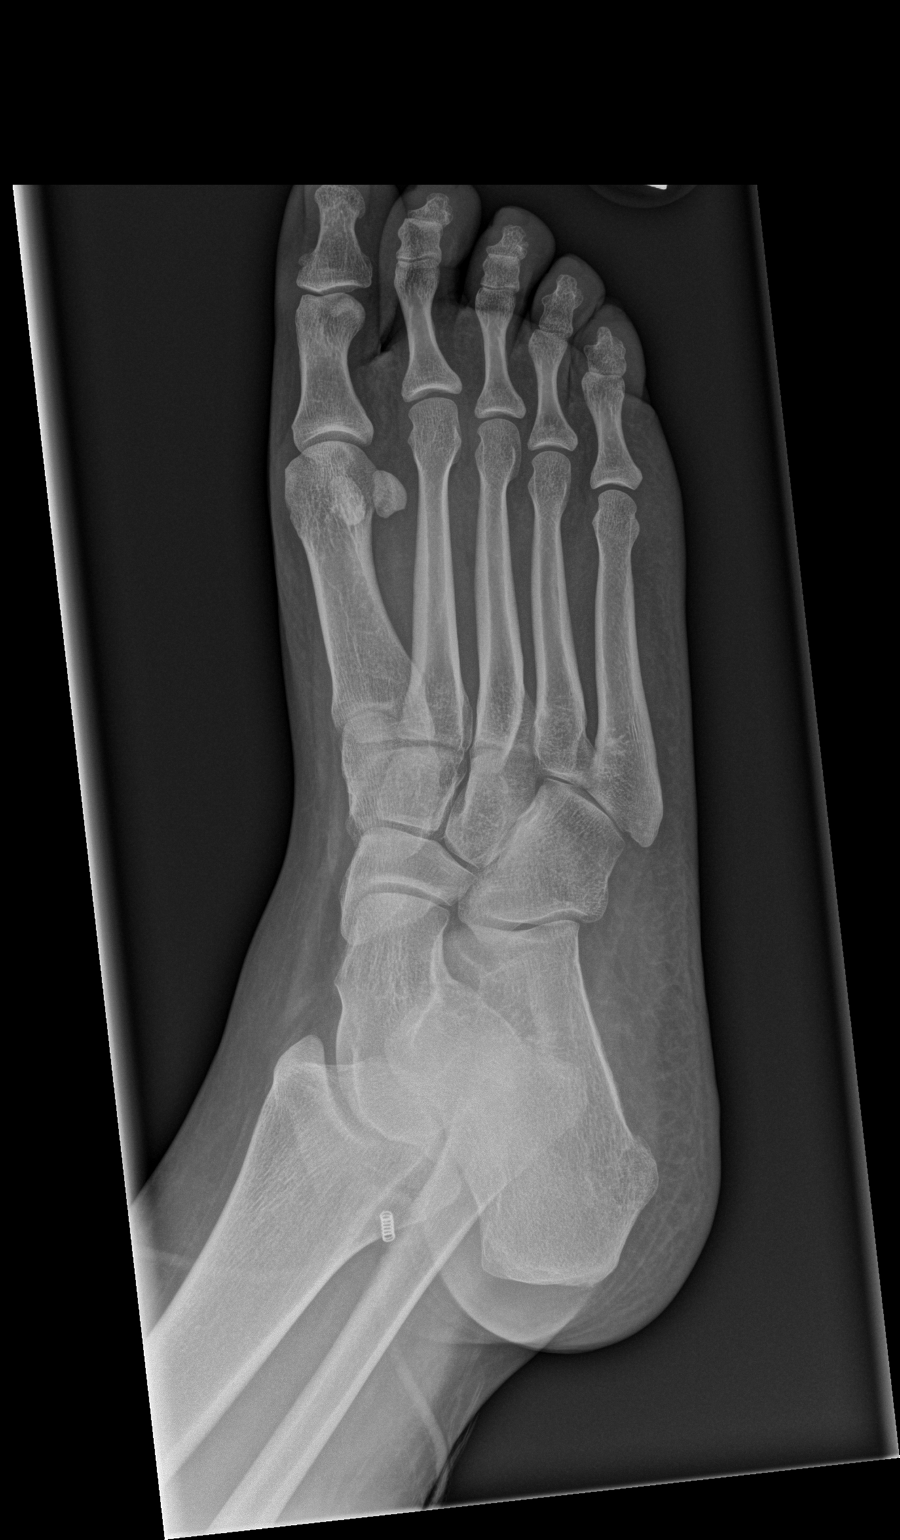

[x foot lat right]
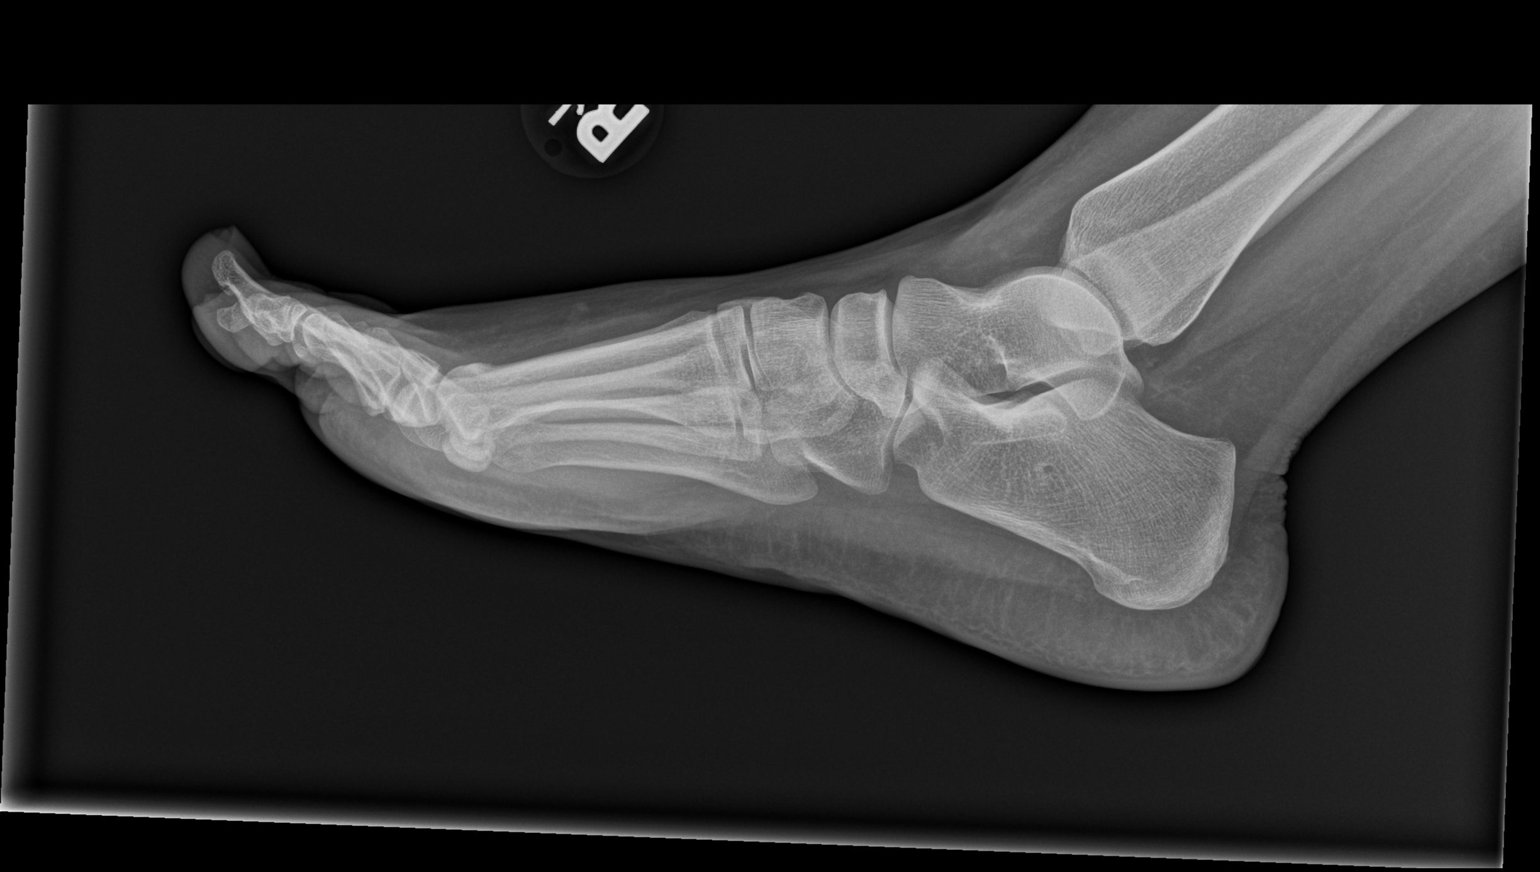

[3 of 3 positions shown; findings below may reference images not displayed]

FINDINGS: No acute fracture or dislocation. No periosteal reaction or callus
deposition.
IMPRESSION: No acute osseous abnormality.

## 2019-11-05 MED FILL — CloNIDine HCL 0.1 MG TAB: 0.1 | 30 days supply | Qty: 60 | Fill #1

## 2019-11-19 ENCOUNTER — Other Ambulatory Visit: Payer: Self-pay

## 2019-11-19 ENCOUNTER — Encounter (HOSPITAL_COMMUNITY): Payer: Self-pay | Admitting: Psychiatry

## 2019-11-19 ENCOUNTER — Ambulatory Visit (INDEPENDENT_AMBULATORY_CARE_PROVIDER_SITE_OTHER): Payer: No Typology Code available for payment source | Admitting: Psychiatry

## 2019-11-19 DIAGNOSIS — F99 Mental disorder, not otherwise specified: Secondary | ICD-10-CM

## 2019-11-19 DIAGNOSIS — G4700 Insomnia, unspecified: Secondary | ICD-10-CM

## 2019-11-19 DIAGNOSIS — F332 Major depressive disorder, recurrent severe without psychotic features: Secondary | ICD-10-CM

## 2019-11-19 DIAGNOSIS — F411 Generalized anxiety disorder: Secondary | ICD-10-CM | POA: Diagnosis not present

## 2019-11-19 DIAGNOSIS — F4310 Post-traumatic stress disorder, unspecified: Secondary | ICD-10-CM | POA: Diagnosis not present

## 2019-11-19 DIAGNOSIS — F5105 Insomnia due to other mental disorder: Secondary | ICD-10-CM

## 2019-11-19 MED ORDER — MIRTAZAPINE 45 MG PO TABS: 45.0000 mg | ORAL_TABLET | Freq: Every day | ORAL | 2 refills | Status: DC

## 2019-11-19 MED ORDER — VENLAFAXINE HCL ER 75 MG PO CP24: 225.0000 mg | ORAL_CAPSULE | Freq: Every day | ORAL | 2 refills | Status: DC

## 2019-11-19 MED ORDER — CLONIDINE HCL 0.1 MG PO TABS: 0.1000 mg | ORAL_TABLET | Freq: Two times a day (BID) | ORAL | 2 refills | Status: DC | PRN

## 2019-11-19 NOTE — Progress Notes (Signed)
Virtual Visit via Telephone Note  I connected with Amber Craig on 11/19/19 at  1:30 PM EDT by telephone and verified that I am speaking with the correct person using two identifiers.  Location: Patient: home Provider: office   I discussed the limitations, risks, security and privacy concerns of performing an evaluation and management service by telephone and the availability of in person appointments. I also discussed with the patient that there may be a patient responsible charge related to this service. The patient expressed understanding and agreed to proceed.   History of Present Illness: "Things have been ok". She has been back at work since March 2. It has been good to see her co-workers. Her anxiety and irritability are better controlled with Clonidine. She is able to tolerate more stress and is able to let some stress go. She is taking Clonidine 5 days a week. It makes her feel tired and she sleeps better with 2 tabs. Amber Craig is not taking it at work.  She likes having a routine. As far as her PTSD goes- she has memories about the trauma and her HV is ongoing. Amber Craig is journaling and it was making her angry. She talked to her therapist who recommended she write less. This week she is feeling down and a little tearful. She doesn't know why. Her depression is not overwhelming and is mostly manageable. She denies SI/HI. Sometimes it is a little worse but still no where she was in the past. Work makes her anxious. She is aware and trying to control her response.    Observations/Objective:  General Appearance: unable to assess  Eye Contact:  unable to assess  Speech:  Clear and Coherent and Normal Rate  Volume:  Normal  Mood:  Euthymic  Affect:  Full Range  Thought Process:  Goal Directed, Linear and Descriptions of Associations: Intact  Orientation:  Full (Time, Place, and Person)  Thought Content:  Logical  Suicidal Thoughts:  No  Homicidal Thoughts:  No  Memory:  Immediate;   Good   Judgement:  Good  Insight:  Good  Psychomotor Activity: unable to assess  Concentration:  Concentration: Good  Recall:  Good  Fund of Knowledge:  Good  Language:  Good  Akathisia:  unable to assess  Handed:  Right  AIMS (if indicated):     Assets:  Communication Skills Desire for Improvement Financial Resources/Insurance Housing Leisure Time Resilience Social Support Talents/Skills Transportation Vocational/Educational  ADL's:  unable to assess  Cognition:  WNL  Sleep:         Assessment and Plan: MDD-severe, without psychotic features; PTSD; GAD; Insomnia  Status of current symptoms: anxiety is up but depression and ptsd are manageable.   Clonidine 0.1mg  po BID prn anxiety  Remeron 45mg  po qHS  Effexor XR 225mg  po qD   Follow Up Instructions: In 2-3 months or sooner if needed   I discussed the assessment and treatment plan with the patient. The patient was provided an opportunity to ask questions and all were answered. The patient agreed with the plan and demonstrated an understanding of the instructions.   The patient was advised to call back or seek an in-person evaluation if the symptoms worsen or if the condition fails to improve as anticipated.  I provided 20 minutes of non-face-to-face time during this encounter.   [name], MD

## 2019-11-23 MED FILL — VENLAFAXINE HCL ER 75 MG CA: 75 | 30 days supply | Qty: 90 | Fill #0

## 2019-12-15 MED FILL — CloNIDine HCL 0.1 MG TAB: 0.1 | 30 days supply | Qty: 60 | Fill #0

## 2019-12-26 ENCOUNTER — Ambulatory Visit (HOSPITAL_COMMUNITY)
Admission: RE | Admit: 2019-12-26 | Discharge: 2019-12-26 | Disposition: A | Discharge status: Home / Self Care | Payer: No Typology Code available for payment source | Source: Ambulatory Visit | Attending: Family Medicine | Admitting: Family Medicine

## 2019-12-26 ENCOUNTER — Other Ambulatory Visit (HOSPITAL_COMMUNITY): Payer: Self-pay | Admitting: Family Medicine

## 2019-12-26 ENCOUNTER — Other Ambulatory Visit: Payer: Self-pay

## 2019-12-26 DIAGNOSIS — M25561 Pain in right knee: Secondary | ICD-10-CM | POA: Diagnosis present

## 2019-12-26 MED FILL — VENLAFAXINE HCL ER 75 MG CA: 75 | 30 days supply | Qty: 90 | Fill #1

## 2019-12-26 MED FILL — ETODOLAC 400 MG TABS: 400 | 30 days supply | Qty: 60 | Fill #0

## 2020-01-04 ENCOUNTER — Ambulatory Visit (INDEPENDENT_AMBULATORY_CARE_PROVIDER_SITE_OTHER)
Admission: RE | Admit: 2020-01-04 | Discharge: 2020-01-04 | Disposition: A | Discharge status: Home / Self Care | Payer: No Typology Code available for payment source | Source: Ambulatory Visit

## 2020-01-04 ENCOUNTER — Ambulatory Visit
Admission: RE | Admit: 2020-01-04 | Discharge: 2020-01-04 | Disposition: A | Discharge status: Home / Self Care | Payer: No Typology Code available for payment source | Source: Ambulatory Visit

## 2020-01-04 DIAGNOSIS — G4485 Primary stabbing headache: Secondary | ICD-10-CM

## 2020-01-04 MED ORDER — INDOMETHACIN 50 MG PO CAPS: 50.0000 mg | ORAL_CAPSULE | Freq: Two times a day (BID) | ORAL | 0 refills | Status: AC
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
## 2020-01-04 NOTE — Discharge Instructions (Addendum)
I have sent in indomethacin 50mg  BID for your ice pick headache.   I would also recommend talking with your psychiatrist for further recommendations.   Follow up as needed.

## 2020-01-11 ENCOUNTER — Other Ambulatory Visit (HOSPITAL_COMMUNITY): Payer: Self-pay | Admitting: Family Medicine

## 2020-01-12 MED FILL — INDOMETHACIN 25 MG CAPSULE: 25 | 30 days supply | Qty: 60 | Fill #0

## 2020-01-13 MED FILL — CloNIDine HCL 0.1 MG TAB: 0.1 | 30 days supply | Qty: 60 | Fill #0

## 2020-01-13 MED FILL — MIRTAZAPINE 45 MG TABLET: 45 | 30 days supply | Qty: 30 | Fill #0

## 2020-01-14 ENCOUNTER — Other Ambulatory Visit: Payer: Self-pay

## 2020-01-14 ENCOUNTER — Encounter (HOSPITAL_COMMUNITY): Payer: Self-pay | Admitting: Psychiatry

## 2020-01-14 ENCOUNTER — Telehealth (INDEPENDENT_AMBULATORY_CARE_PROVIDER_SITE_OTHER): Payer: No Typology Code available for payment source | Admitting: Psychiatry

## 2020-01-14 DIAGNOSIS — F411 Generalized anxiety disorder: Secondary | ICD-10-CM

## 2020-01-14 DIAGNOSIS — F5105 Insomnia due to other mental disorder: Secondary | ICD-10-CM

## 2020-01-14 DIAGNOSIS — F4310 Post-traumatic stress disorder, unspecified: Secondary | ICD-10-CM | POA: Diagnosis not present

## 2020-01-14 DIAGNOSIS — F332 Major depressive disorder, recurrent severe without psychotic features: Secondary | ICD-10-CM

## 2020-01-14 DIAGNOSIS — F99 Mental disorder, not otherwise specified: Secondary | ICD-10-CM

## 2020-01-14 MED ORDER — CLONIDINE HCL 0.1 MG PO TABS: 0.1000 mg | ORAL_TABLET | Freq: Two times a day (BID) | ORAL | 2 refills | Status: DC | PRN

## 2020-01-14 MED ORDER — MIRTAZAPINE 45 MG PO TABS: 45.0000 mg | ORAL_TABLET | Freq: Every day | ORAL | 2 refills | Status: DC

## 2020-01-14 MED ORDER — VENLAFAXINE HCL ER 75 MG PO CP24: 225.0000 mg | ORAL_CAPSULE | Freq: Every day | ORAL | 2 refills | Status: DC

## 2020-01-14 NOTE — Progress Notes (Signed)
Virtual Visit via Telephone Note  I connected with Amber Craig on 01/14/20 at  2:00 PM EDT by telephone and verified that I am speaking with the correct person using two identifiers.  Location: Patient: home Provider: office   I discussed the limitations, risks, security and privacy concerns of performing an evaluation and management service by telephone and the availability of in person appointments. I also discussed with the patient that there may be a patient responsible charge related to this service. The patient expressed understanding and agreed to proceed.   History of Present Illness: Amber Craig is feeling ok. She has some situational depression. Most days she is content. Sleep is good. She remains and interested in things. Her anxiety is manageable. She is more relaxed and able to feel more emotions and deal with hit. Her HV remains high. She denies SI/HI.     Observations/Objective:  General Appearance: unable to assess  Eye Contact:  unable to assess  Speech:  Clear and Coherent and Normal Rate  Volume:  Normal  Mood:  Euthymic  Affect:  Full Range  Thought Process:  Goal Directed, Linear and Descriptions of Associations: Intact  Orientation:  Full (Time, Place, and Person)  Thought Content:  Logical  Suicidal Thoughts:  No  Homicidal Thoughts:  No  Memory:  Immediate;   Good  Judgement:  Good  Insight:  Good  Psychomotor Activity: unable to assess  Concentration:  Concentration: Good  Recall:  Good  Fund of Knowledge:  Good  Language:  Good  Akathisia:  unable to assess  Handed:  Right  AIMS (if indicated):     Assets:  Communication Skills Desire for Improvement Financial Resources/Insurance Housing Leisure Time Physical Health Resilience Social Support Talents/Skills Transportation Vocational/Educational  ADL's:  unable to assess  Cognition:  WNL  Sleep:         Assessment and Plan: MDD-severe, without psychotic features; PTSD; GAD; Insomnia        Clonidine 0.1mg  po BID prn anxiety   Remeron 45mg  po qHS   Effexor XR 225mg  po qD   Follow Up Instructions: In 2-3 months or sooner if needed   I discussed the assessment and treatment plan with the patient. The patient was provided an opportunity to ask questions and all were answered. The patient agreed with the plan and demonstrated an understanding of the instructions.   The patient was advised to call back or seek an in-person evaluation if the symptoms worsen or if the condition fails to improve as anticipated.  I provided 15 minutes of non-face-to-face time during this encounter.   [name], MD

## 2020-01-26 MED FILL — VENLAFAXINE HCL ER 75 MG CA: 75 | 30 days supply | Qty: 90 | Fill #2

## 2020-02-08 MED FILL — ETODOLAC 400 MG TABS: 400 | 30 days supply | Qty: 60 | Fill #1

## 2020-02-15 MED FILL — RESTASIS 0.05% EYE EMULSION: 0.05 | 90 days supply | Qty: 180 | Fill #0

## 2020-02-16 MED FILL — CloNIDine HCL 0.1 MG TAB: 0.1 | 30 days supply | Qty: 60 | Fill #1

## 2020-02-19 MED FILL — MIRTAZAPINE 45 MG TABLET: 45 | 30 days supply | Qty: 30 | Fill #1

## 2020-02-19 MED FILL — VENLAFAXINE HCL ER 75 MG CA: 75 | 30 days supply | Qty: 90 | Fill #0

## 2020-03-11 MED FILL — ETODOLAC 400 MG TABS: 400 | 30 days supply | Qty: 60 | Fill #2

## 2020-03-24 MED FILL — MIRTAZAPINE 45 MG TABLET: 45 | 30 days supply | Qty: 30 | Fill #2

## 2020-03-24 MED FILL — VENLAFAXINE HCL ER 75 MG CA: 75 | 30 days supply | Qty: 90 | Fill #1

## 2020-03-24 MED FILL — CloNIDine HCL 0.1 MG TAB: 0.1 | 30 days supply | Qty: 60 | Fill #0

## 2020-03-31 ENCOUNTER — Other Ambulatory Visit: Payer: Self-pay

## 2020-03-31 ENCOUNTER — Telehealth (INDEPENDENT_AMBULATORY_CARE_PROVIDER_SITE_OTHER): Payer: No Typology Code available for payment source | Admitting: Psychiatry

## 2020-03-31 DIAGNOSIS — F4310 Post-traumatic stress disorder, unspecified: Secondary | ICD-10-CM | POA: Diagnosis not present

## 2020-03-31 DIAGNOSIS — F332 Major depressive disorder, recurrent severe without psychotic features: Secondary | ICD-10-CM

## 2020-03-31 DIAGNOSIS — F411 Generalized anxiety disorder: Secondary | ICD-10-CM | POA: Diagnosis not present

## 2020-03-31 DIAGNOSIS — F5105 Insomnia due to other mental disorder: Secondary | ICD-10-CM | POA: Diagnosis not present

## 2020-03-31 DIAGNOSIS — F99 Mental disorder, not otherwise specified: Secondary | ICD-10-CM

## 2020-03-31 MED ORDER — VENLAFAXINE HCL ER 75 MG PO CP24: 225.0000 mg | ORAL_CAPSULE | Freq: Every day | ORAL | 2 refills | Status: DC

## 2020-03-31 MED ORDER — CLONIDINE HCL 0.1 MG PO TABS: 0.1000 mg | ORAL_TABLET | Freq: Two times a day (BID) | ORAL | 2 refills | Status: DC | PRN

## 2020-03-31 MED ORDER — MIRTAZAPINE 45 MG PO TABS: 45.0000 mg | ORAL_TABLET | Freq: Every day | ORAL | 2 refills | Status: DC

## 2020-03-31 NOTE — Progress Notes (Signed)
Virtual Visit via Telephone Note  I connected with [name] on 03/31/20 at  1:30 PM EDT by telephone and verified that I am speaking with the correct person using two identifiers.  Location: Patient: out of house Provider: office   I discussed the limitations, risks, security and privacy concerns of performing an evaluation and management service by telephone and the availability of in person appointments. I also discussed with the patient that there may be a patient responsible charge related to this service. The patient expressed understanding and agreed to proceed.   History of Present Illness: Overall Vianka feels she is doing well. She had a bad week last month. During that time she was stressed, irritable and angry. Trease was doing a lot of PTSD work in therapy. She finally let it out and cried. She is frustrated and angry that she can to go thru this. Savahna is getting better at recognizing her emotions and using her coping skills. Today she shares that PTSD and anxiety are much better lately. Her HV is high. She is keeping all her lights on and very weary of her surroundings. The depression is stable. Shanese has a few days that were bad. She denies SI/HI over the last 2 weeks. Her sleep is poor. Sumayyah has not been taking Clonidine or Remeron regularly due to her work scheduled. She is now back on track.    Observations/Objective:  General Appearance: unable to assess  Eye Contact:  unable to assess  Speech:  Clear and Coherent and Normal Rate  Volume:  Normal  Mood:  Euthymic  Affect:  Full Range  Thought Process:  Goal Directed, Linear and Descriptions of Associations: Intact  Orientation:  Full (Time, Place, and Person)  Thought Content:  Logical  Suicidal Thoughts:  No  Homicidal Thoughts:  No  Memory:  Immediate;   Good  Judgement:  Good  Insight:  Good  Psychomotor Activity: unable to assess  Concentration:  Concentration: Good  Recall:  Good  Fund of Knowledge:  Good   Language:  Good  Akathisia:  unable to assess  Handed:  Right  AIMS (if indicated):     Assets:  Communication Skills Desire for Improvement Financial Resources/Insurance Housing Resilience Social Support Talents/Skills Transportation Vocational/Educational  ADL's:  unable to assess  Cognition:  WNL  Sleep:         Assessment and Plan:  PTSD; GAD; MDD- recurrent, severe without psychotic features; Insomnia  Remeron 45mg  po qHS  Effexor XR 225mg  po qD  Clonidine 0.1mg  po BID prn anxiety  Encouraged to continue therapy   Follow Up Instructions: In 2-3 months or sooner if needed   I discussed the assessment and treatment plan with the patient. The patient was provided an opportunity to ask questions and all were answered. The patient agreed with the plan and demonstrated an understanding of the instructions.   The patient was advised to call back or seek an in-person evaluation if the symptoms worsen or if the condition fails to improve as anticipated.  I provided 15 minutes of non-face-to-face time during this encounter.   [name], MD

## 2020-04-14 ENCOUNTER — Telehealth (HOSPITAL_COMMUNITY): Payer: No Typology Code available for payment source | Admitting: Psychiatry

## 2020-04-23 MED FILL — VENLAFAXINE HCL ER 75 MG CA: 75 | 30 days supply | Qty: 90 | Fill #2

## 2020-04-23 MED FILL — MIRTAZAPINE 45 MG TABLET: 45 | 30 days supply | Qty: 30 | Fill #0

## 2020-04-23 MED FILL — CloNIDine HCL 0.1 MG TAB: 0.1 | 30 days supply | Qty: 60 | Fill #1

## 2020-04-23 MED FILL — ETODOLAC 400 MG TABS: 400 | 30 days supply | Qty: 60 | Fill #3

## 2020-04-26 ENCOUNTER — Other Ambulatory Visit: Payer: Self-pay

## 2020-04-26 DIAGNOSIS — R202 Paresthesia of skin: Secondary | ICD-10-CM

## 2020-04-27 ENCOUNTER — Ambulatory Visit (INDEPENDENT_AMBULATORY_CARE_PROVIDER_SITE_OTHER): Payer: No Typology Code available for payment source | Admitting: Neurology

## 2020-04-27 ENCOUNTER — Other Ambulatory Visit: Payer: Self-pay

## 2020-04-27 DIAGNOSIS — G5621 Lesion of ulnar nerve, right upper limb: Secondary | ICD-10-CM

## 2020-04-27 DIAGNOSIS — R202 Paresthesia of skin: Secondary | ICD-10-CM | POA: Diagnosis not present

## 2020-04-27 NOTE — Procedures (Signed)
[facility]  [address], [address]  [place], [place] [address] Tel: [phone] Fax:  [phone] Test Date:  04/27/2020  Patient: Amber Craig DOB: 08/27/1981 Physician: Narda Amber, DO  Sex: Female Height: 5\' 4"  Ref Phys: Raynaldo Opitz  ID#: [id number] Temp: 32.0C Technician:    Patient Complaints: This is a 39 year old female referred for evaluation of numbness and tingling involving the right fifth finger.  NCV & EMG Findings: Extensive electrodiagnostic testing of the right upper extremity and additional studies of the left shows:  1. Right ulnar sensory response is asymmetrically reduced on the right as compared to the left (R27.1, L41.6 V).  Right mixed palmar sensory responses show prolonged latency.  Bilateral median and dorsal ulnar cutaneous sensory responses are within normal limits. 2. Right ulnar motor response shows slowed conduction velocity across the elbow at the abductor digit he minimi and first dorsal interosseous muscles (A Elbow-B Elbow, R43, R40 m/s).  Bilateral median motor responses are within normal limits.  3. There is no evidence of active or chronic motor axonal loss changes affecting any of the tested muscles.  Motor unit configuration and recruitment pattern is within normal limits.    Impression: 1. Right ulnar neuropathy with slowing across the elbow, purely demyelinating, moderate. 2. Right median neuropathy at or distal to the wrist (very mild), consistent with a clinical diagnosis of carpal tunnel syndrome.    ___________________________ Narda Amber, DO    Nerve Conduction Studies Anti Sensory Summary Table   Stim Site NR Peak (ms) Norm Peak (ms) P-T Amp (V) Norm P-T Amp  Left DorsCutan Anti Sensory (Dorsum 5th MC)  32C  Wrist    1.3 <3.1 28.3 >12  Right DorsCutan Anti Sensory (Dorsum 5th MC)  32C  Wrist    1.6 <3.1 26.2 >12  Left Median Anti Sensory (2nd Digit)  32C  Wrist    2.7 <3.4 68.6 >20  Right Median Anti  Sensory (2nd Digit)  32C  Wrist    2.7 <3.4 60.4 >20  Left Ulnar Anti Sensory (5th Digit)  32C  Wrist    2.4 <3.1 41.6 >12  Right Ulnar Anti Sensory (5th Digit)  32C  Wrist    2.3 <3.1 27.1 >12   Motor Summary Table   Stim Site NR Onset (ms) Norm Onset (ms) O-P Amp (mV) Norm O-P Amp Site1 Site2 Delta-0 (ms) Dist (cm) Vel (m/s) Norm Vel (m/s)  Left Median Motor (Abd Poll Brev)  32C  Wrist    2.7 <3.9 11.6 >6 Elbow Wrist 4.6 28.0 61 >50  Elbow    7.3  11.3         Right Median Motor (Abd Poll Brev)  32C  Wrist    3.2 <3.9 9.3 >6 Elbow Wrist 4.2 27.0 64 >50  Elbow    7.4  9.2         Left Ulnar Motor (Abd Dig Minimi)  32C  Wrist    1.6 <3.1 12.9 >7 B Elbow Wrist 3.8 24.0 63 >50  B Elbow    5.4  12.7  A Elbow B Elbow 1.6 10.0 63 >50  A Elbow    7.0  12.1         Right Ulnar Motor (Abd Dig Minimi)  32C  Wrist    1.9 <3.1 10.6 >7 B Elbow Wrist 3.8 24.0 63 >50  B Elbow    5.7  10.2  A Elbow B Elbow 2.3 10.0 43 >50  A Elbow  8.0  9.0         Right Ulnar (FDI) Motor (1st DI)  32C  Wrist    3.7 <4.3 12.7 >7 B Elbow Wrist 3.7 24.0 65 >50  B Elbow    7.4  12.7  A Elbow B Elbow 2.5 10.0 40 >50  A Elbow    9.9  11.3          Comparison Summary Table   Stim Site NR Peak (ms) Norm Peak (ms) P-T Amp (V) Site1 Site2 Delta-P (ms) Norm Delta (ms)  Left Median/Ulnar Palm Comparison (Wrist - 8cm)  32C  Median Palm    1.6 <2.2 43.9 Median Palm Ulnar Palm 0.3   Ulnar Palm    1.3 <2.2 38.1      Right Median/Ulnar Palm Comparison (Wrist - 8cm)  32C  Median Palm    1.9 <2.2 68.3 Median Palm Ulnar Palm 0.5   Ulnar Palm    1.4 <2.2 18.2       EMG   Side Muscle Ins Act Fibs Psw Fasc Number Recrt Dur Dur. Amp Amp. Poly Poly. Comment  Right 1stDorInt Nml Nml Nml Nml Nml Nml Nml Nml Nml Nml Nml Nml N/A  Right PronatorTeres Nml Nml Nml Nml Nml Nml Nml Nml Nml Nml Nml Nml N/A  Right Biceps Nml Nml Nml Nml Nml Nml Nml Nml Nml Nml Nml Nml N/A  Right Triceps Nml Nml Nml Nml Nml Nml Nml Nml Nml  Nml Nml Nml N/A  Right Deltoid Nml Nml Nml Nml Nml Nml Nml Nml Nml Nml Nml Nml N/A  Right ABD Dig Min Nml Nml Nml Nml Nml Nml Nml Nml Nml Nml Nml Nml N/A  Right FlexCarpiUln Nml Nml Nml Nml Nml Nml Nml Nml Nml Nml Nml Nml N/A      Waveforms:

## 2020-05-09 ENCOUNTER — Ambulatory Visit: Payer: Self-pay | Admitting: Nurse Practitioner

## 2020-05-17 DIAGNOSIS — G5621 Lesion of ulnar nerve, right upper limb: Secondary | ICD-10-CM | POA: Insufficient documentation

## 2020-05-17 DIAGNOSIS — M7711 Lateral epicondylitis, right elbow: Secondary | ICD-10-CM | POA: Insufficient documentation

## 2020-06-09 ENCOUNTER — Other Ambulatory Visit: Payer: Self-pay

## 2020-06-09 ENCOUNTER — Encounter: Payer: Self-pay | Admitting: Sports Medicine

## 2020-06-09 ENCOUNTER — Ambulatory Visit (INDEPENDENT_AMBULATORY_CARE_PROVIDER_SITE_OTHER): Payer: No Typology Code available for payment source | Admitting: Sports Medicine

## 2020-06-09 ENCOUNTER — Other Ambulatory Visit: Payer: Self-pay | Admitting: Sports Medicine

## 2020-06-09 DIAGNOSIS — L748 Other eccrine sweat disorders: Secondary | ICD-10-CM | POA: Diagnosis not present

## 2020-06-09 DIAGNOSIS — L603 Nail dystrophy: Secondary | ICD-10-CM

## 2020-06-09 DIAGNOSIS — L74513 Primary focal hyperhidrosis, soles: Secondary | ICD-10-CM

## 2020-06-09 DIAGNOSIS — M79671 Pain in right foot: Secondary | ICD-10-CM

## 2020-06-09 DIAGNOSIS — M79672 Pain in left foot: Secondary | ICD-10-CM

## 2020-06-09 MED ORDER — DRYSOL 20 % EX SOLN
Freq: Two times a day (BID) | CUTANEOUS | 5 refills | Status: DC
Start: 2020-06-09 — End: 2020-06-09

## 2020-06-09 MED ORDER — ODOR EATERS FOOT EX POWD: CUTANEOUS | 0 refills | Status: DC

## 2020-06-09 MED FILL — ODOR EATERS FOOT POWD: 30 days supply | Qty: 180 | Fill #0

## 2020-06-09 MED FILL — DRYSOL SOLUTION: 20 | 30 days supply | Qty: 60 | Fill #0
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
## 2020-06-16 ENCOUNTER — Other Ambulatory Visit: Payer: Self-pay

## 2020-06-16 ENCOUNTER — Telehealth (HOSPITAL_COMMUNITY): Payer: No Typology Code available for payment source | Admitting: Psychiatry

## 2020-06-16 DIAGNOSIS — F4310 Post-traumatic stress disorder, unspecified: Secondary | ICD-10-CM

## 2020-06-16 DIAGNOSIS — F5105 Insomnia due to other mental disorder: Secondary | ICD-10-CM

## 2020-06-16 DIAGNOSIS — F99 Mental disorder, not otherwise specified: Secondary | ICD-10-CM

## 2020-06-16 DIAGNOSIS — F411 Generalized anxiety disorder: Secondary | ICD-10-CM

## 2020-06-16 DIAGNOSIS — F332 Major depressive disorder, recurrent severe without psychotic features: Secondary | ICD-10-CM

## 2020-06-16 NOTE — Progress Notes (Unsigned)
Virtual Visit via Telephone Note  I connected with Amber Craig on 06/17/20 at 10:00 AM EDT by telephone and verified that I am speaking with the correct person using two identifiers.  Location: Patient: *** Provider: ***   I discussed the limitations, risks, security and privacy concerns of performing an evaluation and management service by telephone and the availability of in person appointments. I also discussed with the patient that there may be a patient responsible charge related to this service. The patient expressed understanding and agreed to proceed.   History of Present Illness: Doing good. She is working on something in therapy that caused a few bad weeks. During this time she isolate.  She was depressed but it was not as bad as in the past. The was an incident at work regarding doing an 1:1 on a [place]. She became very upset when she was asked to leave work. Sleep is good. She stopped taking Clonidinde for a while for 2-3 weeks while in the incident. She restarted it and it's sleeping better. Denies SI/hI      Observations/Objective:  General Appearance: unable to assess  Eye Contact:  unable to assess  Speech:  {Speech:22685}  Volume:  {Volume (PAA):22686}  Mood:  {BHH MOOD:22306}  Affect:  {Affect (PAA):22687}  Thought Process:  {Thought Process (PAA):22688}  Orientation:  {BHH ORIENTATION (PAA):22689}  Thought Content:  {Thought Content:22690}  Suicidal Thoughts:  {ST/HT (PAA):22692}  Homicidal Thoughts:  {ST/HT (PAA):22692}  Memory:  {BHH MEMORY:22881}  Judgement:  {Judgement (PAA):22694}  Insight:  {Insight (PAA):22695}  Psychomotor Activity: unable to assess  Concentration:  {Concentration:21399}  Recall:  {BHH GOOD/FAIR/POOR:22877}  Fund of Knowledge:  {BHH GOOD/FAIR/POOR:22877}  Language:  {BHH GOOD/FAIR/POOR:22877}  Akathisia:  unable to assess  Handed:  {Handed:22697}  AIMS (if indicated):     Assets:  {Assets (PAA):22698}  ADL's:  unable to assess   Cognition:  {chl bhh cognition:304700322}  Sleep:         Assessment and Plan:  ***  Continue current meds  Follow Up Instructions: In 3 months or sooner if needed   I discussed the assessment and treatment plan with the patient. The patient was provided an opportunity to ask questions and all were answered. The patient agreed with the plan and demonstrated an understanding of the instructions.   The patient was advised to call back or seek an in-person evaluation if the symptoms worsen or if the condition fails to improve as anticipated.  I provided 10 minutes of non-face-to-face time during this encounter.   [name], MD

## 2020-06-17 ENCOUNTER — Other Ambulatory Visit (HOSPITAL_COMMUNITY): Payer: Self-pay | Admitting: Psychiatry

## 2020-06-17 MED ORDER — VENLAFAXINE HCL ER 75 MG PO CP24: 225.0000 mg | ORAL_CAPSULE | Freq: Every day | ORAL | 3 refills | Status: DC

## 2020-06-17 MED ORDER — CLONIDINE HCL 0.1 MG PO TABS: 0.1000 mg | ORAL_TABLET | Freq: Two times a day (BID) | ORAL | 3 refills | Status: DC | PRN

## 2020-06-17 MED ORDER — MIRTAZAPINE 45 MG PO TABS: 45.0000 mg | ORAL_TABLET | Freq: Every day | ORAL | 3 refills | Status: DC

## 2020-06-18 MED FILL — VENLAFAXINE HCL ER 75 MG CA: 75 | 30 days supply | Qty: 90 | Fill #0

## 2020-06-18 MED FILL — CloNIDine HCL 0.1 MG TAB: 0.1 | 30 days supply | Qty: 60 | Fill #0

## 2020-06-18 MED FILL — MIRTAZAPINE 45 MG TABLET: 45 | 30 days supply | Qty: 30 | Fill #0

## 2020-07-14 ENCOUNTER — Ambulatory Visit (INDEPENDENT_AMBULATORY_CARE_PROVIDER_SITE_OTHER): Payer: No Typology Code available for payment source | Admitting: Sports Medicine

## 2020-07-14 ENCOUNTER — Encounter: Payer: Self-pay | Admitting: Sports Medicine

## 2020-07-14 ENCOUNTER — Other Ambulatory Visit: Payer: Self-pay

## 2020-07-14 DIAGNOSIS — L748 Other eccrine sweat disorders: Secondary | ICD-10-CM | POA: Diagnosis not present

## 2020-07-14 DIAGNOSIS — M79671 Pain in right foot: Secondary | ICD-10-CM

## 2020-07-14 DIAGNOSIS — L603 Nail dystrophy: Secondary | ICD-10-CM

## 2020-07-14 DIAGNOSIS — M79672 Pain in left foot: Secondary | ICD-10-CM

## 2020-07-14 DIAGNOSIS — L74513 Primary focal hyperhidrosis, soles: Secondary | ICD-10-CM

## 2020-07-14 NOTE — Addendum Note (Signed)
Addended by: Geni Bers on: 07/14/2020 02:25 PM   Modules accepted: Orders
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
## 2020-07-16 MED FILL — MIRTAZAPINE 45 MG TABLET: 45 | 30 days supply | Qty: 30 | Fill #1

## 2020-07-16 MED FILL — VENLAFAXINE HCL ER 75 MG CA: 75 | 30 days supply | Qty: 90 | Fill #1

## 2020-07-16 MED FILL — CloNIDine HCL 0.1 MG TAB: 0.1 | 30 days supply | Qty: 60 | Fill #1

## 2020-07-17 ENCOUNTER — Other Ambulatory Visit (HOSPITAL_COMMUNITY): Payer: Self-pay | Admitting: Family Medicine

## 2020-07-18 MED FILL — ETODOLAC 400 MG TABS: 400 | 30 days supply | Qty: 60 | Fill #0

## 2020-08-17 MED FILL — MIRTAZAPINE 45 MG TABLET: 45 | 30 days supply | Qty: 30 | Fill #2

## 2020-08-17 MED FILL — CloNIDine HCL 0.1 MG TAB: 0.1 | 30 days supply | Qty: 60 | Fill #2

## 2020-08-17 MED FILL — VENLAFAXINE HCL ER 75 MG CA: 75 | 30 days supply | Qty: 90 | Fill #2

## 2020-08-17 MED FILL — ETODOLAC 400 MG TABS: 400 | 30 days supply | Qty: 60 | Fill #1

## 2020-08-18 ENCOUNTER — Ambulatory Visit (INDEPENDENT_AMBULATORY_CARE_PROVIDER_SITE_OTHER): Payer: No Typology Code available for payment source | Admitting: Sports Medicine

## 2020-08-18 ENCOUNTER — Encounter: Payer: Self-pay | Admitting: Sports Medicine

## 2020-08-18 ENCOUNTER — Other Ambulatory Visit: Payer: Self-pay

## 2020-08-18 DIAGNOSIS — M79672 Pain in left foot: Secondary | ICD-10-CM

## 2020-08-18 DIAGNOSIS — L748 Other eccrine sweat disorders: Secondary | ICD-10-CM

## 2020-08-18 DIAGNOSIS — L603 Nail dystrophy: Secondary | ICD-10-CM

## 2020-08-18 DIAGNOSIS — M79671 Pain in right foot: Secondary | ICD-10-CM

## 2020-08-18 DIAGNOSIS — L74513 Primary focal hyperhidrosis, soles: Secondary | ICD-10-CM | POA: Diagnosis not present
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
## 2020-08-18 NOTE — Patient Instructions (Addendum)
Vinegar soaks 1 cup of white distilled vinegar to 8 cups of warm water.  Soak 20 mins. May repeat soak two times per week.  If there is thickness to nails may file nails after soaks or after bath/shower with nail file and apply tea tree oil. Apply oil daily to nails after filing for the best result.   Carpe Official Website - Stop Feet Sweat Today

## 2020-09-08 ENCOUNTER — Other Ambulatory Visit: Payer: Self-pay

## 2020-09-08 ENCOUNTER — Other Ambulatory Visit (HOSPITAL_COMMUNITY): Payer: Self-pay | Admitting: Psychiatry

## 2020-09-08 ENCOUNTER — Telehealth (INDEPENDENT_AMBULATORY_CARE_PROVIDER_SITE_OTHER): Payer: No Typology Code available for payment source | Admitting: Psychiatry

## 2020-09-08 DIAGNOSIS — F99 Mental disorder, not otherwise specified: Secondary | ICD-10-CM

## 2020-09-08 DIAGNOSIS — F411 Generalized anxiety disorder: Secondary | ICD-10-CM | POA: Diagnosis not present

## 2020-09-08 DIAGNOSIS — F1021 Alcohol dependence, in remission: Secondary | ICD-10-CM | POA: Diagnosis not present

## 2020-09-08 DIAGNOSIS — F5105 Insomnia due to other mental disorder: Secondary | ICD-10-CM

## 2020-09-08 DIAGNOSIS — F332 Major depressive disorder, recurrent severe without psychotic features: Secondary | ICD-10-CM

## 2020-09-08 DIAGNOSIS — F4310 Post-traumatic stress disorder, unspecified: Secondary | ICD-10-CM | POA: Diagnosis not present

## 2020-09-08 MED ORDER — CLONIDINE HCL 0.1 MG PO TABS: 0.1000 mg | ORAL_TABLET | Freq: Two times a day (BID) | ORAL | 3 refills | Status: DC | PRN

## 2020-09-08 MED ORDER — MIRTAZAPINE 45 MG PO TABS: 45.0000 mg | ORAL_TABLET | Freq: Every day | ORAL | 3 refills | Status: DC

## 2020-09-08 MED ORDER — VENLAFAXINE HCL ER 75 MG PO CP24: 225.0000 mg | ORAL_CAPSULE | Freq: Every day | ORAL | 3 refills | Status: DC

## 2020-09-08 NOTE — Progress Notes (Signed)
Virtual Visit via Telephone Note  I connected with Amber Craig on 09/08/20 at  9:30 AM EST by telephone and verified that I am speaking with the correct person using two identifiers.  Location: Patient: home Provider: office   I discussed the limitations, risks, security and privacy concerns of performing an evaluation and management service by telephone and the availability of in person appointments. I also discussed with the patient that there may be a patient responsible charge related to this service. The patient expressed understanding and agreed to proceed.   History of Present Illness: Amber Craig is doing better at work. She was having a problem with some workers and was irritable and snapping at them. She had a meeting with her supervisor and Amber Craig is getting better at controlling her reaction. Amber Craig feels a lot of anxiety at work. She floats and doesn't feel safe sometimes when she has to sit in open areas with nothing behind her back.   Her depression is stable and is mostly situational. Her sleep is good. She denies SI/HI.  She is drinking a bottle of wine each day and a couple of grams of marijuana a day. She usually smokes 1ppd except on days she works. Amber Craig does not smoke at work which is contributing to her irritability at work. Amber Craig wants to stop all use due to cost. She had decided to go to [employer] and is starting today. She has talked to her accountability partner and they plan to start talking every Friday. Amber Craig is going to [employer] every Sunday and it's very helpful.  Amber Craig recently lost her license due to unpaid tickets. She is now taking [place] everywhere. It is costly and adds to her stress.     Observations/Objective:  General Appearance: unable to assess  Eye Contact:  unable to assess  Speech:  Clear and Coherent and Normal Rate  Volume:  Normal  Mood:  Anxious and Depressed  Affect:  Congruent  Thought Process:  Coherent and Descriptions of Associations:  Circumstantial  Orientation:  Full (Time, Place, and Person)  Thought Content:  Logical  Suicidal Thoughts:  No  Homicidal Thoughts:  No  Memory:  Immediate;   Good  Judgement:  Good  Insight:  Good  Psychomotor Activity: unable to assess  Concentration:  Concentration: Good  Recall:  Good  Fund of Knowledge:  Good  Language:  Good  Akathisia:  unable to assess  Handed:  Right  AIMS (if indicated):     Assets:  Communication Skills Desire for Improvement Financial Resources/Insurance Housing Social Support Talents/Skills Vocational/Educational  ADL's:  unable to assess  Cognition:  WNL  Sleep:         Assessment and Plan: Alcohol use disorder, moderate PTSD (post-traumatic stress disorder) - Plan: cloNIDine (CATAPRES) 0.1 MG tablet, mirtazapine (REMERON) 45 MG tablet, venlafaxine XR (EFFEXOR-XR) 75 MG 24 hr capsule  GAD (generalized anxiety disorder) - Plan: cloNIDine (CATAPRES) 0.1 MG tablet, mirtazapine (REMERON) 45 MG tablet, venlafaxine XR (EFFEXOR-XR) 75 MG 24 hr capsule  Insomnia due to other mental disorder - Plan: cloNIDine (CATAPRES) 0.1 MG tablet, mirtazapine (REMERON) 45 MG tablet  Severe episode of recurrent major depressive disorder, without psychotic features ([place]) - Plan: mirtazapine (REMERON) 45 MG tablet, venlafaxine XR (EFFEXOR-XR) 75 MG 24 hr capsule  - encouraged her steps towards sobriety  - encouraged her to continue therapy as she is noting benefit.  Follow Up Instructions: In 6 weeks or sooner if needed   I discussed the assessment and  treatment plan with the patient. The patient was provided an opportunity to ask questions and all were answered. The patient agreed with the plan and demonstrated an understanding of the instructions.   The patient was advised to call back or seek an in-person evaluation if the symptoms worsen or if the condition fails to improve as anticipated.  I provided 21 minutes of non-face-to-face time during this  encounter.   Amber Cradle, MD

## 2020-09-14 MED FILL — CloNIDine HCL 0.1 MG TAB: 0.1 | 30 days supply | Qty: 60 | Fill #3

## 2020-09-14 MED FILL — VENLAFAXINE HCL ER 75 MG CA: 75 | 30 days supply | Qty: 90 | Fill #3

## 2020-09-14 MED FILL — MIRTAZAPINE 45 MG TABLET: 45 | 30 days supply | Qty: 30 | Fill #3

## 2020-09-14 MED FILL — INDOMETHACIN 25 MG CAPSULE: 25 | 30 days supply | Qty: 60 | Fill #1

## 2020-10-11 MED FILL — MIRTAZAPINE 45 MG TABLET: 45 | 30 days supply | Qty: 30 | Fill #0

## 2020-10-11 MED FILL — CloNIDine HCL 0.1 MG TAB: 0.1 | 30 days supply | Qty: 60 | Fill #0

## 2020-10-11 MED FILL — VENLAFAXINE HCL ER 75 MG CA: 75 | 30 days supply | Qty: 90 | Fill #0

## 2020-10-18 ENCOUNTER — Other Ambulatory Visit (HOSPITAL_COMMUNITY): Payer: Self-pay | Admitting: Physician Assistant

## 2020-10-18 DIAGNOSIS — H6122 Impacted cerumen, left ear: Secondary | ICD-10-CM | POA: Insufficient documentation

## 2020-10-18 DIAGNOSIS — J300 Vasomotor rhinitis: Secondary | ICD-10-CM | POA: Insufficient documentation

## 2020-10-18 MED FILL — MELOXICAM 15 MG TABLET: 15 | 30 days supply | Qty: 30 | Fill #0

## 2020-10-18 MED FILL — IPRATROPIUM 0.03% SPRAY: 0.03 | 28 days supply | Qty: 30 | Fill #0

## 2020-10-19 MED FILL — DRYSOL SOLUTION: 20 | 30 days supply | Qty: 60 | Fill #1

## 2020-10-27 ENCOUNTER — Other Ambulatory Visit: Payer: Self-pay

## 2020-10-27 ENCOUNTER — Telehealth (INDEPENDENT_AMBULATORY_CARE_PROVIDER_SITE_OTHER): Payer: No Typology Code available for payment source | Admitting: Psychiatry

## 2020-10-27 ENCOUNTER — Other Ambulatory Visit (HOSPITAL_COMMUNITY): Payer: Self-pay | Admitting: Psychiatry

## 2020-10-27 DIAGNOSIS — F5105 Insomnia due to other mental disorder: Secondary | ICD-10-CM | POA: Diagnosis not present

## 2020-10-27 DIAGNOSIS — F411 Generalized anxiety disorder: Secondary | ICD-10-CM | POA: Diagnosis not present

## 2020-10-27 DIAGNOSIS — F4310 Post-traumatic stress disorder, unspecified: Secondary | ICD-10-CM

## 2020-10-27 DIAGNOSIS — F1210 Cannabis abuse, uncomplicated: Secondary | ICD-10-CM

## 2020-10-27 DIAGNOSIS — F1021 Alcohol dependence, in remission: Secondary | ICD-10-CM

## 2020-10-27 DIAGNOSIS — F99 Mental disorder, not otherwise specified: Secondary | ICD-10-CM

## 2020-10-27 DIAGNOSIS — F332 Major depressive disorder, recurrent severe without psychotic features: Secondary | ICD-10-CM

## 2020-10-27 MED ORDER — VENLAFAXINE HCL ER 75 MG PO CP24: 225.0000 mg | ORAL_CAPSULE | Freq: Every day | ORAL | 0 refills | Status: DC

## 2020-10-27 MED ORDER — CLONIDINE HCL 0.1 MG PO TABS: 0.1000 mg | ORAL_TABLET | Freq: Three times a day (TID) | ORAL | 0 refills | Status: DC

## 2020-10-27 MED ORDER — MIRTAZAPINE 30 MG PO TABS: 60.0000 mg | ORAL_TABLET | Freq: Every day | ORAL | 0 refills | Status: DC

## 2020-10-27 MED FILL — MIRTAZAPINE 30 MG TABLET: 30 | 90 days supply | Qty: 180 | Fill #0

## 2020-10-27 NOTE — Progress Notes (Signed)
Virtual Visit via Video Note  I connected with Amber Craig on 10/27/20 at  9:30 AM EST by a video enabled telemedicine application and verified that I am speaking with the correct person using two identifiers.  Location: Patient: home Provider: office   I discussed the limitations of evaluation and management by telemedicine and the availability of in person appointments. The patient expressed understanding and agreed to proceed.  History of Present Illness: Amber Craig shares that the work has been going better.  Is mostly due to a change in her attitude regarding her work day.  Her depression and anxiety are ongoing and are mostly situational.  She continues to drink half a bottle to 1 full bottle of wine a night and smoke about 2 blunts after work.  She has not been attending AA or NA meetings.  Amber Craig is feeling ashamed and guilty for engaging in substance abuse but admits to having no motivation to change.  She is considering outpatient substance abuse PHP but is somewhat concerned about the financial impact and having to tell people about her level of misuse. Her PTSD is driving some of her ongoing substance abuse. She is having HV and intrusive memories thur out the day. She has passive thoughts of death but denies SI/HI. Her sleep is good with Clonidine 0.2mg  qHS.     Observations/Objective: Psychiatric Specialty Exam: ROS  There were no vitals taken for this visit.There is no height or weight on file to calculate BMI.  General Appearance: Casual and Neat  Eye Contact:  Good  Speech:  Clear and Coherent and Normal Rate  Volume:  Normal  Mood:  Euthymic  Affect:  Full Range  Thought Process:  Goal Directed, Linear and Descriptions of Associations: Intact  Orientation:  Full (Time, Place, and Person)  Thought Content:  Logical  Suicidal Thoughts:  No  Homicidal Thoughts:  No  Memory:  Immediate;   Good  Judgement:  Fair  Insight:  Good  Psychomotor Activity:  Normal  Concentration:   Concentration: Good  Recall:  Good  Fund of Knowledge:  Good  Language:  Good  Akathisia:  No  Handed:  Right  AIMS (if indicated):     Assets:  Communication Skills Desire for Improvement Financial Resources/Insurance Housing Leisure Time Resilience Social Support Talents/Skills Transportation Vocational/Educational  ADL's:  Intact  Cognition:  WNL  Sleep:        Assessment and Plan: 1. PTSD (post-traumatic stress disorder) - cloNIDine (CATAPRES) 0.1 MG tablet; Take 1 tablet (0.1 mg total) by mouth 3 (three) times daily.  Dispense: 270 tablet; Refill: 0 - mirtazapine (REMERON) 30 MG tablet; Take 2 tablets (60 mg total) by mouth at bedtime.  Dispense: 180 tablet; Refill: 0 - venlafaxine XR (EFFEXOR-XR) 75 MG 24 hr capsule; Take 3 capsules (225 mg total) by mouth daily.  Dispense: 270 capsule; Refill: 0  2. GAD (generalized anxiety disorder) - cloNIDine (CATAPRES) 0.1 MG tablet; Take 1 tablet (0.1 mg total) by mouth 3 (three) times daily.  Dispense: 270 tablet; Refill: 0 - mirtazapine (REMERON) 30 MG tablet; Take 2 tablets (60 mg total) by mouth at bedtime.  Dispense: 180 tablet; Refill: 0 - venlafaxine XR (EFFEXOR-XR) 75 MG 24 hr capsule; Take 3 capsules (225 mg total) by mouth daily.  Dispense: 270 capsule; Refill: 0  3. Insomnia due to other mental disorder - cloNIDine (CATAPRES) 0.1 MG tablet; Take 1 tablet (0.1 mg total) by mouth 3 (three) times daily.  Dispense: 270 tablet; Refill: 0 -  mirtazapine (REMERON) 30 MG tablet; Take 2 tablets (60 mg total) by mouth at bedtime.  Dispense: 180 tablet; Refill: 0  4. Severe episode of recurrent major depressive disorder, without psychotic features ([place]) - mirtazapine (REMERON) 30 MG tablet; Take 2 tablets (60 mg total) by mouth at bedtime.  Dispense: 180 tablet; Refill: 0 - venlafaxine XR (EFFEXOR-XR) 75 MG 24 hr capsule; Take 3 capsules (225 mg total) by mouth daily.  Dispense: 270 capsule; Refill: 0  5. Alcohol use disorder,  moderate 6. Cannabis abuse - discussed option of starting substance abuse php.     Follow Up Instructions: In 2 months or sooner if needed   I discussed the assessment and treatment plan with the patient. The patient was provided an opportunity to ask questions and all were answered. The patient agreed with the plan and demonstrated an understanding of the instructions.   The patient was advised to call back or seek an in-person evaluation if the symptoms worsen or if the condition fails to improve as anticipated.   [name], MD

## 2020-11-01 ENCOUNTER — Ambulatory Visit (INDEPENDENT_AMBULATORY_CARE_PROVIDER_SITE_OTHER): Payer: No Typology Code available for payment source | Admitting: Licensed Clinical Social Worker

## 2020-11-01 ENCOUNTER — Other Ambulatory Visit: Payer: Self-pay

## 2020-11-01 DIAGNOSIS — F4310 Post-traumatic stress disorder, unspecified: Secondary | ICD-10-CM

## 2020-11-01 DIAGNOSIS — F1010 Alcohol abuse, uncomplicated: Secondary | ICD-10-CM

## 2020-11-01 DIAGNOSIS — F332 Major depressive disorder, recurrent severe without psychotic features: Secondary | ICD-10-CM | POA: Diagnosis not present
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
## 2020-11-14 ENCOUNTER — Other Ambulatory Visit (HOSPITAL_COMMUNITY): Payer: Self-pay | Admitting: Allergy

## 2020-11-14 MED FILL — AZELASTINE HCL 0.05 % SOLN: 0.05 | 30 days supply | Qty: 6 | Fill #0

## 2020-11-14 MED FILL — LEVOCETIRIZINE 5 MG TABLET: 5 | 30 days supply | Qty: 30 | Fill #0
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
## 2020-11-18 ENCOUNTER — Other Ambulatory Visit (HOSPITAL_BASED_OUTPATIENT_CLINIC_OR_DEPARTMENT_OTHER): Payer: Self-pay

## 2020-11-21 ENCOUNTER — Encounter (HOSPITAL_COMMUNITY): Payer: No Typology Code available for payment source

## 2020-11-23 ENCOUNTER — Other Ambulatory Visit (HOSPITAL_COMMUNITY): Payer: Self-pay | Admitting: Medical

## 2020-11-23 ENCOUNTER — Other Ambulatory Visit: Payer: Self-pay

## 2020-11-23 ENCOUNTER — Other Ambulatory Visit (HOSPITAL_COMMUNITY)
Payer: No Typology Code available for payment source | Attending: Psychiatry | Admitting: Licensed Clinical Social Worker

## 2020-11-23 ENCOUNTER — Encounter (HOSPITAL_COMMUNITY): Payer: Self-pay | Admitting: Medical

## 2020-11-23 VITALS — BP 130/86 | HR 85 | Resp 18 | Ht 64.0 in | Wt 189.0 lb

## 2020-11-23 DIAGNOSIS — F341 Dysthymic disorder: Secondary | ICD-10-CM | POA: Diagnosis not present

## 2020-11-23 DIAGNOSIS — Z62811 Personal history of psychological abuse in childhood: Secondary | ICD-10-CM | POA: Diagnosis not present

## 2020-11-23 DIAGNOSIS — F4312 Post-traumatic stress disorder, chronic: Secondary | ICD-10-CM | POA: Insufficient documentation

## 2020-11-23 DIAGNOSIS — F5105 Insomnia due to other mental disorder: Secondary | ICD-10-CM | POA: Insufficient documentation

## 2020-11-23 DIAGNOSIS — F419 Anxiety disorder, unspecified: Secondary | ICD-10-CM

## 2020-11-23 DIAGNOSIS — F1220 Cannabis dependence, uncomplicated: Secondary | ICD-10-CM | POA: Diagnosis not present

## 2020-11-23 DIAGNOSIS — Z9189 Other specified personal risk factors, not elsewhere classified: Secondary | ICD-10-CM

## 2020-11-23 DIAGNOSIS — F1020 Alcohol dependence, uncomplicated: Secondary | ICD-10-CM | POA: Insufficient documentation

## 2020-11-23 DIAGNOSIS — Z6372 Alcoholism and drug addiction in family: Secondary | ICD-10-CM | POA: Insufficient documentation

## 2020-11-23 DIAGNOSIS — Z9151 Personal history of suicidal behavior: Secondary | ICD-10-CM | POA: Insufficient documentation

## 2020-11-23 DIAGNOSIS — Z79899 Other long term (current) drug therapy: Secondary | ICD-10-CM | POA: Insufficient documentation

## 2020-11-23 DIAGNOSIS — F17200 Nicotine dependence, unspecified, uncomplicated: Secondary | ICD-10-CM | POA: Diagnosis not present

## 2020-11-23 DIAGNOSIS — F17210 Nicotine dependence, cigarettes, uncomplicated: Secondary | ICD-10-CM | POA: Diagnosis not present

## 2020-11-23 DIAGNOSIS — F99 Mental disorder, not otherwise specified: Secondary | ICD-10-CM

## 2020-11-23 DIAGNOSIS — Z811 Family history of alcohol abuse and dependence: Secondary | ICD-10-CM

## 2020-11-23 DIAGNOSIS — Z62810 Personal history of physical and sexual abuse in childhood: Secondary | ICD-10-CM | POA: Diagnosis not present

## 2020-11-23 MED ORDER — NALTREXONE HCL 50 MG PO TABS
50.0000 mg | ORAL_TABLET | Freq: Every day | ORAL | 2 refills | Status: DC
Start: 2020-11-23 — End: 2020-11-23

## 2020-11-23 MED FILL — NALTREXONE 50 MG TABLET: 50 | 30 days supply | Qty: 30 | Fill #0
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
## 2020-11-24 ENCOUNTER — Other Ambulatory Visit (HOSPITAL_COMMUNITY): Payer: No Typology Code available for payment source | Admitting: Licensed Clinical Social Worker

## 2020-11-24 ENCOUNTER — Other Ambulatory Visit: Payer: Self-pay

## 2020-11-24 DIAGNOSIS — F1220 Cannabis dependence, uncomplicated: Secondary | ICD-10-CM

## 2020-11-24 DIAGNOSIS — F341 Dysthymic disorder: Secondary | ICD-10-CM

## 2020-11-24 DIAGNOSIS — F4312 Post-traumatic stress disorder, chronic: Secondary | ICD-10-CM

## 2020-11-24 DIAGNOSIS — H1045 Other chronic allergic conjunctivitis: Secondary | ICD-10-CM | POA: Insufficient documentation

## 2020-11-24 DIAGNOSIS — F1020 Alcohol dependence, uncomplicated: Secondary | ICD-10-CM

## 2020-11-24 DIAGNOSIS — J301 Allergic rhinitis due to pollen: Secondary | ICD-10-CM | POA: Insufficient documentation

## 2020-11-24 MED FILL — CloNIDine HCL 0.1 MG TAB: 0.1 | 30 days supply | Qty: 90 | Fill #0

## 2020-11-28 ENCOUNTER — Other Ambulatory Visit (HOSPITAL_COMMUNITY)
Payer: No Typology Code available for payment source | Attending: Psychiatry | Admitting: Licensed Clinical Social Worker

## 2020-11-28 ENCOUNTER — Other Ambulatory Visit: Payer: Self-pay

## 2020-11-28 DIAGNOSIS — F1020 Alcohol dependence, uncomplicated: Secondary | ICD-10-CM | POA: Diagnosis not present

## 2020-11-28 DIAGNOSIS — F1220 Cannabis dependence, uncomplicated: Secondary | ICD-10-CM

## 2020-11-28 DIAGNOSIS — Z62810 Personal history of physical and sexual abuse in childhood: Secondary | ICD-10-CM | POA: Diagnosis not present

## 2020-11-28 DIAGNOSIS — F4312 Post-traumatic stress disorder, chronic: Secondary | ICD-10-CM | POA: Diagnosis not present

## 2020-11-28 DIAGNOSIS — F17200 Nicotine dependence, unspecified, uncomplicated: Secondary | ICD-10-CM | POA: Insufficient documentation

## 2020-11-28 DIAGNOSIS — Z79899 Other long term (current) drug therapy: Secondary | ICD-10-CM | POA: Diagnosis not present

## 2020-11-28 DIAGNOSIS — F331 Major depressive disorder, recurrent, moderate: Secondary | ICD-10-CM | POA: Diagnosis present

## 2020-11-28 DIAGNOSIS — Z62811 Personal history of psychological abuse in childhood: Secondary | ICD-10-CM | POA: Diagnosis not present

## 2020-11-28 DIAGNOSIS — F5105 Insomnia due to other mental disorder: Secondary | ICD-10-CM | POA: Insufficient documentation

## 2020-11-28 DIAGNOSIS — Z6372 Alcoholism and drug addiction in family: Secondary | ICD-10-CM | POA: Insufficient documentation

## 2020-11-28 DIAGNOSIS — F341 Dysthymic disorder: Secondary | ICD-10-CM

## 2020-11-28 NOTE — Progress Notes (Signed)
Individual session linked with cdiop   Daily Group Progress Note  Program: CD-IOP   Group Time: 1-2:30pm Participation Level: Active Behavioral Response: Appropriate Type of Therapy: Process Group Topic: Clinician checked in with group members, assessing for SI/HI/psychosis and overall level of functioning, including cravings, threats to sobriety relapse, and number of community support groups attended since last session. Clinician and group members processed 'highs' and 'lows' since last group any challenges to recovery. Clinician utilized motivational interviewing OARS for deeper processing. Clinician and group members read JFT and AA Daily meditation and processed topics in relation to current place in recovery.   Group Time: 2:30pm-4pm Participation Level: Active Behavioral Response: Appropriate Type of Therapy: Process Group Topic: Clinician presented mindfulness activity 'my mask' working with clients on creating and explaining how we view self vs how we present to the world. Clinician provided psycho-educational information on Dysfunctional Family Roles and the Family and family 'Culture' related to substance abuse. Clinician and group members discussed reasons for roles including ability to rational, denial, avoidance of pain, and excuses to continue behaviors. Clinician reviewed roles and discussed with clients roles in current family as the chemically dependent person and any roles in family of origin. Clinician discussed the importance of identifying strengths of identified role and behaviors to be addressed to avoid unhealthy behaviors such as not getting needs met and resulting resentment as discussed in previous group. Clinician inquired about self care activity to support recovery to be completed before next group.    Summary: Client checked in with sobriety date of 11/23/20 with 0 AA/NA meetings attended. Client acknowledged the need for accountability related to meeting  attendance. Client denied having a specific enabling, noted she enabled herself by picking a job at which she moved frequently making it difficult for co-workers to identify changes in behaviors. Client completed mask activity, reporting recently completing in individual therapy. Changes noted this time were including positive traits about self in addition to initially negative listed traits. Client showed progress toward goals by attending therapy despite lack of motivation. Client needs continued progress supporting building sober support community AEB attending 12 step meetings and maintaining sobriety.   Family Program: Family present? No   Name of family member(s): NA  UDS collected: Yes Results: pending  AA/NA attended?: No  Sponsor?: N/A   Olegario Messier, LCSW

## 2020-11-29 ENCOUNTER — Other Ambulatory Visit (HOSPITAL_COMMUNITY): Payer: Self-pay

## 2020-11-29 NOTE — Progress Notes (Signed)
    Daily Group Progress Note  Program: CD-IOP  Group Time: 1pm-2:30pm Participation Level: Active Behavioral Response: Appropriate Type of Therapy: Process Group Topic: Clinician checked in with group members, assessing for SI/HI/psychosis and overall level of functioning including relapse and barriers to recovery. Clinician and group members discussed highlights and struggles since last group related to maintaining sobriety including identified triggers and responses. Clinician and group members processed Daily Reflection and personal meeting to current work in recovery. Clinician and group members discussed "facing feelings" and explored the effect substances had on the intensity of emotions while in active addiction.      Group Time: 2:30pm-4pm Participation Level: Active Behavioral Response: Appropriate Type of Therapy: Psycho-education Group Topic: Clinician presented the psycho-educational topic of Willingness vs Willfulness. Clinician and group members defined willingness and willfulness and provided examples of recent moments of each related to addiction and sobriety. Clinician provided supplemental video on 'Willingness as an Antidote to Anxiety.' Clinician and group members discussed the practice of sitting in uncomfortable emotions or physical sensations and resisting the urge to act impulsively, specifically in relation to cravings in early recovery. Group members completed activity presented in video and clinician reviewed additional skill of Half-smiling and Willing hands. Clinician also provided 5-senses grounding activity and practiced in session with group members. Clinician and group celebrated graduation of one member providing supportive feedback to support recovery.   Summary: Client presented for first day of CDIOP following missed start date due to oversleeping. Client presented with a sobriety date of 11/23/20. Client acknowledged not wanting to stop but needing to stop  substances for her health and employment before acquiring any additional consequences. Client shared utilizing substances as self medication. Client was receptive to psycho-education and discussions with peers. Client met with PA, see note for additional details.   Family Program: Family present? No   Name of family member(s): NA  UDS collected: Yes Results: pending  AA/NA attended?: No  Sponsor?: No   Olegario Messier, LCSW
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
## 2020-11-30 ENCOUNTER — Other Ambulatory Visit (HOSPITAL_COMMUNITY): Payer: No Typology Code available for payment source | Admitting: Medical

## 2020-11-30 ENCOUNTER — Other Ambulatory Visit: Payer: Self-pay

## 2020-11-30 ENCOUNTER — Encounter (HOSPITAL_COMMUNITY): Payer: Self-pay | Admitting: Medical

## 2020-11-30 DIAGNOSIS — F99 Mental disorder, not otherwise specified: Secondary | ICD-10-CM

## 2020-11-30 DIAGNOSIS — F341 Dysthymic disorder: Secondary | ICD-10-CM

## 2020-11-30 DIAGNOSIS — F1220 Cannabis dependence, uncomplicated: Secondary | ICD-10-CM

## 2020-11-30 DIAGNOSIS — F4312 Post-traumatic stress disorder, chronic: Secondary | ICD-10-CM

## 2020-11-30 DIAGNOSIS — F17200 Nicotine dependence, unspecified, uncomplicated: Secondary | ICD-10-CM

## 2020-11-30 DIAGNOSIS — F1020 Alcohol dependence, uncomplicated: Secondary | ICD-10-CM

## 2020-11-30 DIAGNOSIS — Z9189 Other specified personal risk factors, not elsewhere classified: Secondary | ICD-10-CM

## 2020-11-30 DIAGNOSIS — F419 Anxiety disorder, unspecified: Secondary | ICD-10-CM

## 2020-11-30 DIAGNOSIS — Z62811 Personal history of psychological abuse in childhood: Secondary | ICD-10-CM

## 2020-11-30 DIAGNOSIS — F5105 Insomnia due to other mental disorder: Secondary | ICD-10-CM

## 2020-11-30 DIAGNOSIS — Z62810 Personal history of physical and sexual abuse in childhood: Secondary | ICD-10-CM

## 2020-11-30 DIAGNOSIS — Z811 Family history of alcohol abuse and dependence: Secondary | ICD-10-CM
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
## 2020-12-01 ENCOUNTER — Other Ambulatory Visit (HOSPITAL_COMMUNITY): Payer: No Typology Code available for payment source | Admitting: Licensed Clinical Social Worker

## 2020-12-01 ENCOUNTER — Other Ambulatory Visit: Payer: Self-pay

## 2020-12-01 DIAGNOSIS — F4310 Post-traumatic stress disorder, unspecified: Secondary | ICD-10-CM

## 2020-12-01 DIAGNOSIS — F1220 Cannabis dependence, uncomplicated: Secondary | ICD-10-CM

## 2020-12-01 DIAGNOSIS — F1020 Alcohol dependence, uncomplicated: Secondary | ICD-10-CM

## 2020-12-01 DIAGNOSIS — F331 Major depressive disorder, recurrent, moderate: Secondary | ICD-10-CM

## 2020-12-01 DIAGNOSIS — F4312 Post-traumatic stress disorder, chronic: Secondary | ICD-10-CM | POA: Diagnosis not present

## 2020-12-02 NOTE — Progress Notes (Signed)
    Daily Group Progress Note  Program: CD-IOP   Group Time: 1pm-2:30pm Participation Level: Active Behavioral Response: Appropriate and Sharing Type of Therapy: Process Group Topic: Clinician checked in with group members, assessing for SI/HI/psychosis and overall level of functioning. Clinician and group shared sobriety date, and any community meetings attended. Clinician and group members processed recent barriers to recovery/sobriety and coping skills used in response. Clinicians read AA Daily Reflection and NA Just for today with a focus on "Value of the past" and use of stories between fellows for support. Clinician and group members completed self assessment, identifying areas in life which are going well and areas of life which could use support.     Group Time: 2:30-4pm Participation Level: Active Behavioral Response: Appropriate and Sharing Type of Therapy: Psycho-education Group Topic: Clinician facilitated mindfulness activity with acupressure to address anxiety. Clinician presented the topic of Stages of Change. Clinician and group members reviewed common behaviors in each stage of clients identified personal stage. Clinician provided screening for stages of change based on thought patterns for clients to utilize in the future to be mindful of thoughts/behaviors possibly related to relapse or change in stage. Clinician presented [place] Melter and discussed behaviors in chronic use vs recovery. Clinician and group members completed material from SMART recovery, focused on identifying pros and cons of continued and changed behaviors. Group members checked in with activity to support recovery prior to next session.   Summary: Client presented fully oriented, did not endorse SI/HI, did not appear psychotic or intoxicated at the time of session. Client noted being tired with low motivation and is proud she attend group despite wanting to stay home due to weather. Client identified  preparation vs action stage 'depending on the day.' Client needs continued support on goal of achieving and maintaining AEB sobriety date of 11/28/20. Client reported attempts at using distraction skills and plans for the weekend to avoid some known triggers.   Family Program: Family present? No   Name of family member(s): NA  UDS collected: No Results: n/a  AA/NA attended?: No  Sponsor?: No   Olegario Messier, LCSW

## 2020-12-05 ENCOUNTER — Other Ambulatory Visit: Payer: Self-pay

## 2020-12-05 ENCOUNTER — Other Ambulatory Visit (HOSPITAL_COMMUNITY): Payer: No Typology Code available for payment source | Admitting: Licensed Clinical Social Worker

## 2020-12-05 DIAGNOSIS — F17200 Nicotine dependence, unspecified, uncomplicated: Secondary | ICD-10-CM

## 2020-12-05 DIAGNOSIS — F5105 Insomnia due to other mental disorder: Secondary | ICD-10-CM

## 2020-12-05 DIAGNOSIS — F341 Dysthymic disorder: Secondary | ICD-10-CM

## 2020-12-05 DIAGNOSIS — F1020 Alcohol dependence, uncomplicated: Secondary | ICD-10-CM

## 2020-12-05 DIAGNOSIS — F419 Anxiety disorder, unspecified: Secondary | ICD-10-CM

## 2020-12-05 DIAGNOSIS — Z62811 Personal history of psychological abuse in childhood: Secondary | ICD-10-CM

## 2020-12-05 DIAGNOSIS — Z62810 Personal history of physical and sexual abuse in childhood: Secondary | ICD-10-CM

## 2020-12-05 DIAGNOSIS — F4310 Post-traumatic stress disorder, unspecified: Secondary | ICD-10-CM

## 2020-12-05 DIAGNOSIS — Z811 Family history of alcohol abuse and dependence: Secondary | ICD-10-CM

## 2020-12-05 DIAGNOSIS — F4312 Post-traumatic stress disorder, chronic: Secondary | ICD-10-CM | POA: Diagnosis not present

## 2020-12-05 DIAGNOSIS — Z9189 Other specified personal risk factors, not elsewhere classified: Secondary | ICD-10-CM

## 2020-12-05 DIAGNOSIS — F331 Major depressive disorder, recurrent, moderate: Secondary | ICD-10-CM

## 2020-12-05 DIAGNOSIS — F1220 Cannabis dependence, uncomplicated: Secondary | ICD-10-CM
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
## 2020-12-07 ENCOUNTER — Other Ambulatory Visit (HOSPITAL_COMMUNITY): Payer: No Typology Code available for payment source | Admitting: Licensed Clinical Social Worker

## 2020-12-07 ENCOUNTER — Other Ambulatory Visit: Payer: Self-pay

## 2020-12-07 DIAGNOSIS — F1020 Alcohol dependence, uncomplicated: Secondary | ICD-10-CM

## 2020-12-07 DIAGNOSIS — F4310 Post-traumatic stress disorder, unspecified: Secondary | ICD-10-CM

## 2020-12-07 DIAGNOSIS — F1220 Cannabis dependence, uncomplicated: Secondary | ICD-10-CM

## 2020-12-07 DIAGNOSIS — F331 Major depressive disorder, recurrent, moderate: Secondary | ICD-10-CM

## 2020-12-08 ENCOUNTER — Other Ambulatory Visit: Payer: Self-pay

## 2020-12-08 ENCOUNTER — Other Ambulatory Visit (HOSPITAL_COMMUNITY): Payer: Self-pay

## 2020-12-08 ENCOUNTER — Other Ambulatory Visit (HOSPITAL_COMMUNITY): Payer: No Typology Code available for payment source | Admitting: Licensed Clinical Social Worker

## 2020-12-08 DIAGNOSIS — F1020 Alcohol dependence, uncomplicated: Secondary | ICD-10-CM

## 2020-12-08 DIAGNOSIS — F4312 Post-traumatic stress disorder, chronic: Secondary | ICD-10-CM | POA: Diagnosis not present

## 2020-12-08 DIAGNOSIS — F4310 Post-traumatic stress disorder, unspecified: Secondary | ICD-10-CM

## 2020-12-08 DIAGNOSIS — F1220 Cannabis dependence, uncomplicated: Secondary | ICD-10-CM

## 2020-12-08 MED FILL — Venlafaxine HCl Cap ER 24HR 75 MG (Base Equivalent): ORAL | 30 days supply | Qty: 90 | Fill #0 | Status: AC

## 2020-12-09 NOTE — Progress Notes (Addendum)
Considered part or individual session for cdiop   Daily Group Progress Note  Program: CD-IOP   Group Time: 1pm-2:30pm Participation Level: Active Behavioral Response: Appropriate Type of Therapy: Psycho-education Group Topic: Psycho-educational group facilitated by Pharmacist from [facility]. Pharmacist provided information on medications, side effects, and interactions. Clients were provided with time to as questions about medications. Pharmacist provided feedback on medications to help with cravings and encouraged the need for behavioral intervention in addition to medication changes. Group reviewed possible negative side effects of THC vs CBD product use and potential for abuse of substances other than drug of choice. Group discussed ways to advocate for self when meeting with doctors to avoid potentially addictive medications.    Group Time: 2:30pm-4pm Participation Level: Active Behavioral Response: Appropriate Type of Therapy: Process Group Topic: Clinician checked in with clients assessing for SI/HI/psychosis and overall level of functioning. Check in was continued with things going well, things which could be going better, and anything threatening sobriety. Clinician and group members processed recent life events causing stress and related thoughts and feelings effecting recovery and relapse. Clinician and group members normalized behaviors and provided support. Clinician and group members read and processed NA's Just for today with a focus on self-pity and people pleasing. Clinician and group members explored upcoming interactions with family members and solicited feedback on individual responses.  Clinician inquired a self-care activity to support recovery to be completed prior to next session.   Summary: Client checked in with a sobriety date of 11/19/20. Client showed progress toward her goal of building sober support community AEB attending a virtual meeting but  has a goal of attending an in person meeting. Client requires continued support on maintaining sobriety on weekends. Client inquired about medications to support this and will meeting with provider the following week. Client shared progress at decreasing people pleasing behaviors which often built resentment.   Family Program: Family present? No   Name of family member(s): NA  UDS collected: No   AA/NA attended?: Yes; 1  Sponsor?: No   Olegario Messier, LCSW

## 2020-12-12 ENCOUNTER — Other Ambulatory Visit: Payer: Self-pay

## 2020-12-12 ENCOUNTER — Other Ambulatory Visit (HOSPITAL_COMMUNITY): Payer: No Typology Code available for payment source | Admitting: Medical

## 2020-12-13 NOTE — Addendum Note (Signed)
Addended by: Micheline Chapman A on: 12/13/2020 12:03 PM   Modules accepted: Level of Service

## 2020-12-13 NOTE — Progress Notes (Signed)
    Daily Group Progress Note  Program: CD-IOP   Group Time: 1-2:30pm Participation Level: Active  Behavioral Response: Appropriate Type of Therapy: Process Group  Topic: Clinician checked in with group members, assessing for SI/HI/psychosis and overall level of functioning, including cravings, relapse, and participation in community support meetings. Clinician explored with clients identifying and processing 'highs' and 'lows' since last group and processed any 'roadblocks' to recovery. Clinician and group members processed guilt and shame which comes with relapse, primarily from self. Clinician and group members discussed role of boredom in recovery and brainstormed activities to fill time. Group members also discussed coping with lack of motivation in early recovery and increased sensitivity to physical and emotional pain. Clinician and group members read and processed AA's Daily Reflection and NA's Just For Today focused on blame. Clients discussed the process of acceptance of addiction and accepting responsibility for own behaviors.   Group Time: 2:30pm-4pm Participation Level:  Active Behavioral Response: Appropriate Type of Therapy: Psycho-education Group Topic: Clinician presented the topic of Codependence with supportive information from The Substance Abuse & Recovery Workbook. Clients completed Codependency scale. Clinician and group members reviewed descriptions for caretaking,  self-worth, and dependency. Clinician and group members reviewed how behaviors could present in current life including discussing Motivations for behaviors, and cost/benefit of caretaking vs enabling. Clinician presented attachment styles and effect on relationships, work, and willingness to accept help. Clinician requested group members identify one self care activity to support recovery to be completed prior to next session.   Summary: Client checked in with sobriety date of 12/05/20. Client needs continued  support to make progress toward goal of achieving/maintaining sobriety. Client did make progress toward goal of attending community support meeting but has not obtained a sponsor. Client acknowledged willfulness and using substances despite knowing there were consequences and accepted responsibility for personal choices. Client demonstrated progress toward decreasing co-dependency AEB follow through with not allowing nephew to live with her due to negative consequences on her mental health. Client reported continued struggle with motivation in several areas of life.   Family Program: Family present? No   Name of family member(s): NA  UDS collected: Yes Results: pending; clt reports will be positive for etoh and thc  AA/NA attended?: Yes 3  Sponsor?: No   Olegario Messier, LCSW

## 2020-12-14 ENCOUNTER — Encounter (HOSPITAL_COMMUNITY): Payer: Self-pay | Admitting: Medical

## 2020-12-14 ENCOUNTER — Other Ambulatory Visit: Payer: Self-pay

## 2020-12-14 ENCOUNTER — Other Ambulatory Visit (HOSPITAL_COMMUNITY): Payer: No Typology Code available for payment source | Admitting: Licensed Clinical Social Worker

## 2020-12-14 ENCOUNTER — Other Ambulatory Visit (HOSPITAL_COMMUNITY): Payer: Self-pay | Admitting: *Deleted

## 2020-12-14 DIAGNOSIS — Z62811 Personal history of psychological abuse in childhood: Secondary | ICD-10-CM

## 2020-12-14 DIAGNOSIS — IMO0001 Reserved for inherently not codable concepts without codable children: Secondary | ICD-10-CM

## 2020-12-14 DIAGNOSIS — F1220 Cannabis dependence, uncomplicated: Secondary | ICD-10-CM

## 2020-12-14 DIAGNOSIS — F17200 Nicotine dependence, unspecified, uncomplicated: Secondary | ICD-10-CM

## 2020-12-14 DIAGNOSIS — F1020 Alcohol dependence, uncomplicated: Secondary | ICD-10-CM

## 2020-12-14 DIAGNOSIS — Z62810 Personal history of physical and sexual abuse in childhood: Secondary | ICD-10-CM

## 2020-12-14 DIAGNOSIS — F4310 Post-traumatic stress disorder, unspecified: Secondary | ICD-10-CM

## 2020-12-14 DIAGNOSIS — Z9189 Other specified personal risk factors, not elsewhere classified: Secondary | ICD-10-CM

## 2020-12-14 DIAGNOSIS — F4312 Post-traumatic stress disorder, chronic: Secondary | ICD-10-CM | POA: Diagnosis not present

## 2020-12-14 DIAGNOSIS — Z811 Family history of alcohol abuse and dependence: Secondary | ICD-10-CM

## 2020-12-14 DIAGNOSIS — F341 Dysthymic disorder: Secondary | ICD-10-CM

## 2020-12-14 DIAGNOSIS — F419 Anxiety disorder, unspecified: Secondary | ICD-10-CM

## 2020-12-14 DIAGNOSIS — F5105 Insomnia due to other mental disorder: Secondary | ICD-10-CM

## 2020-12-14 DIAGNOSIS — F331 Major depressive disorder, recurrent, moderate: Secondary | ICD-10-CM

## 2020-12-14 DIAGNOSIS — F319 Bipolar disorder, unspecified: Secondary | ICD-10-CM
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
## 2020-12-15 ENCOUNTER — Encounter (HOSPITAL_COMMUNITY): Payer: No Typology Code available for payment source

## 2020-12-15 NOTE — Progress Notes (Signed)
    Daily Group Progress Note  Program: CD-IOP    Group Time: 1pm-2pm Participation Level: Active Behavioral Response: Appropriate Type of Therapy: Psycho-education Group Topic: Psychoeducational presentation co-facilitated by Cleta Alberts. from [place]. STI and HIV psychoeducation was provided to clients. Clients were provided with time to ask questions and discuss local resources.     Group Time: 2pm-4pm Participation Level: Active Behavioral Response: Appropriate Type of Therapy: Process Group Topic: Process group: Clinician checked in with clients, assessing for SI/HI/psychosis and overall level of functioning including cravings, barriers to sobriety, and highlights when skills were successfully used. Clinician and group processed Daily Reflection and Just For Today with a focus on self-examination and detachment. Group members processed being the ones to provide insight into treatment and recovery rather than being on the receiving end. Clinician presented Tapping as a skill for managing uncomfortable emotions and addressing negative self-talk and thoughts. Clinician facilitated discussion on acknowledgement of negative self-talk and working toward acceptance of all parts of self. Clinician facilitated processing feelings of un-worthiness in relation to recovery being a selfish process and the need to accept help when offered, even if difficult. Clients provided support for others' progress and solutions to roadblocks. Clinician inquired about self-care activity to be completed before next group.    Summary: Client missed group Monday due to oversleeping. Client reported a sobriety date of 12/14/20. Client processed weekend events and rationalizations for smoking. Client was receptive to psycho-education and coping skill to address negative self talk. Client showed progress toward goal AEB attending meetings however they remain virtual despite her goal of in person meetings.  Client needs continued progress on achieving and maintaining sobriety and coping with cravings.   Family Program: Family present? No   Name of family member(s): NA  UDS collected: Yes Results: THC  AA/NA attended?: Yes 3 virtual meetings  Sponsor?: No   Olegario Messier, LCSW

## 2020-12-19 ENCOUNTER — Other Ambulatory Visit: Payer: Self-pay

## 2020-12-19 ENCOUNTER — Other Ambulatory Visit (HOSPITAL_COMMUNITY): Payer: No Typology Code available for payment source | Admitting: Licensed Clinical Social Worker

## 2020-12-19 DIAGNOSIS — F341 Dysthymic disorder: Secondary | ICD-10-CM

## 2020-12-19 DIAGNOSIS — F4312 Post-traumatic stress disorder, chronic: Secondary | ICD-10-CM | POA: Diagnosis not present

## 2020-12-19 DIAGNOSIS — F1020 Alcohol dependence, uncomplicated: Secondary | ICD-10-CM

## 2020-12-19 DIAGNOSIS — F1220 Cannabis dependence, uncomplicated: Secondary | ICD-10-CM

## 2020-12-19 DIAGNOSIS — F4310 Post-traumatic stress disorder, unspecified: Secondary | ICD-10-CM

## 2020-12-20 LAB — CBC WITH DIFFERENTIAL/PLATELET
Basophils Absolute: 0 10*3/uL (ref 0.0–0.2)
Basos: 0 %
EOS (ABSOLUTE): 0 10*3/uL (ref 0.0–0.4)
Eos: 0 %
Hematocrit: 42.7 % (ref 34.0–46.6)
Hemoglobin: 14.2 g/dL (ref 11.1–15.9)
Immature Grans (Abs): 0 10*3/uL (ref 0.0–0.1)
Immature Granulocytes: 0 %
Lymphocytes Absolute: 2.9 10*3/uL (ref 0.7–3.1)
Lymphs: 43 %
MCH: 28.3 pg (ref 26.6–33.0)
MCHC: 33.3 g/dL (ref 31.5–35.7)
MCV: 85 fL (ref 79–97)
Monocytes Absolute: 0.5 10*3/uL (ref 0.1–0.9)
Monocytes: 7 %
Neutrophils Absolute: 3.3 10*3/uL (ref 1.4–7.0)
Neutrophils: 50 %
Platelets: 269 10*3/uL (ref 150–450)
RBC: 5.01 x10E6/uL (ref 3.77–5.28)
RDW: 13.4 % (ref 11.7–15.4)
WBC: 6.7 10*3/uL (ref 3.4–10.8)

## 2020-12-20 LAB — CMP14+EGFR
ALT: 25 IU/L (ref 0–32)
AST: 20 IU/L (ref 0–40)
Albumin/Globulin Ratio: 1.4 (ref 1.2–2.2)
Albumin: 4.3 g/dL (ref 3.8–4.8)
Alkaline Phosphatase: 88 IU/L (ref 44–121)
BUN/Creatinine Ratio: 6 — ABNORMAL LOW (ref 9–23)
BUN: 6 mg/dL (ref 6–20)
Bilirubin Total: <0.2 mg/dL (ref 0.0–1.2)
CO2: 22 mmol/L (ref 20–29)
Calcium: 9.3 mg/dL (ref 8.7–10.2)
Chloride: 101 mmol/L (ref 96–106)
Creatinine, Ser: 0.94 mg/dL (ref 0.57–1.00)
Globulin, Total: 3.1 g/dL (ref 1.5–4.5)
Glucose: 101 mg/dL — ABNORMAL HIGH (ref 65–99)
Potassium: 4.5 mmol/L (ref 3.5–5.2)
Sodium: 137 mmol/L (ref 134–144)
Total Protein: 7.4 g/dL (ref 6.0–8.5)
eGFR: 80 mL/min/{1.73_m2} (ref >59)

## 2020-12-20 NOTE — Progress Notes (Signed)
    Daily Group Progress Note  Program: CD-IOP   Group Time: 1pm-2:30pm  Participation Level: Active Behavioral Response: Appropriate Type of Therapy: Process Group Topic: Clinician checked in with group members, assessing for SI/HI/psychosis and overall level of functioning including relapse and barriers to recovery. Clinician and group members discussed highlights and struggles since last group related to maintaining sobriety including identified triggers and responses. Clinician and group members processed and provided feedback related to barriers to recovery process. Clinician and group members processed Daily Reflection and personal meaning to current work in recovery with a focus on resistance and 'embracing' vs 'accepting' the reality of addiction and sobriety.    Group Time: 2:30pm-4pm Participation Level: Active Behavioral Response: Appropriate Type of Therapy: Process/Psycho-education Group Topic: Clinician presented 'Letter From My Disease,' poem written from perspective of addiction and how it can manifest, as well as strategies for relapse prevention. Clinician and group processed thoughts and feelings related to poem mirroring their experience with addiction. Clinician provided outline for clients to complete 'Goodbye to My Addiction' letter. Group members completed mindful journaling activity and some members read letters aloud. Clinician facilitated processing of experience writing and reading personal letter, praising group members for interpersonal feedback and support. Clinician provided mindful stretching activity for distress tolerance which could be added into daily routine. Clinician inquired about a self-care activity supporting recovery which would be completed before returning to next group.  Summary: Client presented with sobriety date of 12/19/20. Client noted it was easier not to drink alcohol over the weekend while around her family and surrounded by people who were  not consuming alcohol. Client noted that she did smoke marijuana over the weekend 'because I wanted to' and denied a thought of trying to abstain first. Client declined desire for higher level of care despite inability to stop substances halfway through current program. Client reported no progress of skills AEB attempting journal as prompted previous week for emotional regulation but did participate in writing goodbye letter in session without sharing with group. Client reported phrase embracing recovery resonated with her and the need for action vs acceptance being passive for her view of recovery.   Family Program: Family present? No   Name of family member(s): NA  UDS collected: Yes Results: uds from previous week positive for thc  AA/NA attended?: Yes; client made progress toward building sober support community AEB attending 3 virtual [place] meetings  Sponsor?: No; client has had repeated goal of attending in person meeting but at this time has not attended an in person meeting yet.   Olegario Messier, LCSW

## 2020-12-21 ENCOUNTER — Other Ambulatory Visit (HOSPITAL_COMMUNITY): Payer: No Typology Code available for payment source | Admitting: Licensed Clinical Social Worker

## 2020-12-21 ENCOUNTER — Other Ambulatory Visit: Payer: Self-pay

## 2020-12-21 DIAGNOSIS — F1020 Alcohol dependence, uncomplicated: Secondary | ICD-10-CM

## 2020-12-21 DIAGNOSIS — F4312 Post-traumatic stress disorder, chronic: Secondary | ICD-10-CM | POA: Diagnosis not present

## 2020-12-21 DIAGNOSIS — F1220 Cannabis dependence, uncomplicated: Secondary | ICD-10-CM

## 2020-12-21 DIAGNOSIS — F341 Dysthymic disorder: Secondary | ICD-10-CM

## 2020-12-21 DIAGNOSIS — F4310 Post-traumatic stress disorder, unspecified: Secondary | ICD-10-CM

## 2020-12-22 ENCOUNTER — Other Ambulatory Visit (HOSPITAL_COMMUNITY): Payer: No Typology Code available for payment source | Admitting: Medical

## 2020-12-22 ENCOUNTER — Other Ambulatory Visit (HOSPITAL_COMMUNITY): Payer: Self-pay

## 2020-12-22 ENCOUNTER — Other Ambulatory Visit: Payer: Self-pay

## 2020-12-22 DIAGNOSIS — F4310 Post-traumatic stress disorder, unspecified: Secondary | ICD-10-CM

## 2020-12-22 DIAGNOSIS — F331 Major depressive disorder, recurrent, moderate: Secondary | ICD-10-CM

## 2020-12-22 DIAGNOSIS — F99 Mental disorder, not otherwise specified: Secondary | ICD-10-CM

## 2020-12-22 DIAGNOSIS — Z62810 Personal history of physical and sexual abuse in childhood: Secondary | ICD-10-CM

## 2020-12-22 DIAGNOSIS — F17200 Nicotine dependence, unspecified, uncomplicated: Secondary | ICD-10-CM

## 2020-12-22 DIAGNOSIS — Z62811 Personal history of psychological abuse in childhood: Secondary | ICD-10-CM

## 2020-12-22 DIAGNOSIS — F4312 Post-traumatic stress disorder, chronic: Secondary | ICD-10-CM

## 2020-12-22 DIAGNOSIS — F5105 Insomnia due to other mental disorder: Secondary | ICD-10-CM

## 2020-12-22 DIAGNOSIS — Z811 Family history of alcohol abuse and dependence: Secondary | ICD-10-CM

## 2020-12-22 DIAGNOSIS — F1020 Alcohol dependence, uncomplicated: Secondary | ICD-10-CM

## 2020-12-22 DIAGNOSIS — F1220 Cannabis dependence, uncomplicated: Secondary | ICD-10-CM

## 2020-12-22 DIAGNOSIS — F319 Bipolar disorder, unspecified: Secondary | ICD-10-CM

## 2020-12-22 DIAGNOSIS — Z9189 Other specified personal risk factors, not elsewhere classified: Secondary | ICD-10-CM

## 2020-12-22 DIAGNOSIS — F419 Anxiety disorder, unspecified: Secondary | ICD-10-CM

## 2020-12-22 DIAGNOSIS — F341 Dysthymic disorder: Secondary | ICD-10-CM

## 2020-12-22 MED ORDER — DISULFIRAM 250 MG PO TABS
ORAL_TABLET | ORAL | 0 refills | Status: DC
  Filled 2020-12-22: qty 8, 28d supply, fill #0
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
## 2020-12-23 ENCOUNTER — Other Ambulatory Visit (HOSPITAL_COMMUNITY): Payer: Self-pay

## 2020-12-23 ENCOUNTER — Encounter (HOSPITAL_COMMUNITY): Payer: Self-pay | Admitting: Licensed Clinical Social Worker

## 2020-12-23 NOTE — Addendum Note (Signed)
Addended by: Dara Hoyer on: 12/23/2020 11:31 AM   Modules accepted: Level of Service

## 2020-12-26 ENCOUNTER — Other Ambulatory Visit (HOSPITAL_COMMUNITY)
Payer: No Typology Code available for payment source | Attending: Psychiatry | Admitting: Licensed Clinical Social Worker

## 2020-12-26 ENCOUNTER — Other Ambulatory Visit: Payer: Self-pay

## 2020-12-26 DIAGNOSIS — Z62810 Personal history of physical and sexual abuse in childhood: Secondary | ICD-10-CM | POA: Insufficient documentation

## 2020-12-26 DIAGNOSIS — F332 Major depressive disorder, recurrent severe without psychotic features: Secondary | ICD-10-CM | POA: Insufficient documentation

## 2020-12-26 DIAGNOSIS — F17200 Nicotine dependence, unspecified, uncomplicated: Secondary | ICD-10-CM | POA: Diagnosis not present

## 2020-12-26 DIAGNOSIS — F419 Anxiety disorder, unspecified: Secondary | ICD-10-CM | POA: Insufficient documentation

## 2020-12-26 DIAGNOSIS — F4310 Post-traumatic stress disorder, unspecified: Secondary | ICD-10-CM

## 2020-12-26 DIAGNOSIS — F331 Major depressive disorder, recurrent, moderate: Secondary | ICD-10-CM | POA: Diagnosis present

## 2020-12-26 DIAGNOSIS — Z9189 Other specified personal risk factors, not elsewhere classified: Secondary | ICD-10-CM | POA: Diagnosis not present

## 2020-12-26 DIAGNOSIS — Z62811 Personal history of psychological abuse in childhood: Secondary | ICD-10-CM | POA: Diagnosis not present

## 2020-12-26 DIAGNOSIS — F5105 Insomnia due to other mental disorder: Secondary | ICD-10-CM | POA: Insufficient documentation

## 2020-12-26 DIAGNOSIS — Z791 Long term (current) use of non-steroidal anti-inflammatories (NSAID): Secondary | ICD-10-CM | POA: Diagnosis not present

## 2020-12-26 DIAGNOSIS — Z6372 Alcoholism and drug addiction in family: Secondary | ICD-10-CM | POA: Diagnosis not present

## 2020-12-26 DIAGNOSIS — Z79899 Other long term (current) drug therapy: Secondary | ICD-10-CM | POA: Diagnosis not present

## 2020-12-26 DIAGNOSIS — F1220 Cannabis dependence, uncomplicated: Secondary | ICD-10-CM | POA: Insufficient documentation

## 2020-12-26 DIAGNOSIS — F4312 Post-traumatic stress disorder, chronic: Secondary | ICD-10-CM | POA: Diagnosis not present

## 2020-12-26 DIAGNOSIS — F1020 Alcohol dependence, uncomplicated: Secondary | ICD-10-CM | POA: Insufficient documentation

## 2020-12-26 DIAGNOSIS — F341 Dysthymic disorder: Secondary | ICD-10-CM | POA: Insufficient documentation

## 2020-12-26 NOTE — Progress Notes (Signed)
    Daily Group Progress Note  Program: CD-IOP   Group Time: 1pm-2:30pm  Participation Level: Active Behavioral Response: Appropriate Type of Therapy: Process Group Clinician checked in with group members, assessing for SI/HI/psychosis and overall level of functioning including difficulties with cravings or relapse. Clinician and group members processed 'highs and lows' since last group and explored with clients their effect on recovery. Clinician and group members processed Daily Reflection and Just For Today, reflecting on recognizing and releasing resentments in recovery. Group members processed perceived benefits of holding on to resentments purposefully. Clinician and group members completed core values activity and processed changes in behaviors which demonstrate core values while sober vs in active addiction.   Group Time: 2:30pm-4pm Participation Level: Active Behavioral Response: Appropriate Type of Therapy: Psycho-education/Process Group Topic: Clinician presented in session a values clarification group activity [place], focused on allowing clients to implement healthy, assertive communication skills and boundary setting based on meeting individual and group needs. Clinician praised clients use of willingness to listen and being open to compromise.  Clinician presented psycho-educational information on Adult Core Belief Clusters and developmental plateaus. Clinician and group members reviewed irrational and adaptive thought patterns and discussed effect of ACEs (adverse childhood experiences) on core belief about self and safety of interactions with others. Clinician inquired about self care activity to be completed prior to next group.   Summary: Client presented with sobriety date of 12/19/20. Client identified with group members around willfullness in holding resentments. Client participated in group activity, providing examples of how some values were affected by life  experiences when younger. Client overall remained engaged despite visible frustration and showed progress toward goals AEB utilizing distress tolerance skills in session to stabilize mood..   Family Program: Family present? No   Name of family member(s): NA  UDS collected: No Results: pending  AA/NA attended?: Yes  Sponsor?: No   Olegario Messier, LCSW

## 2020-12-27 NOTE — Progress Notes (Signed)
    Daily Group Progress Note  Program: CD-IOP   Group Time: 1-2:30pm Participation Level: Active  Behavioral Response: Appropriate Type of Therapy: Process Group   Topic: Clinician checked in with group members, assessing for SI/HI/psychosis and overall level of functioning, including cravings, relapse, and participation in community support meetings. Clinician to identify and process 'highs' and 'lows' since last group. Group processed any challenges or struggles experienced which impacted their recovery and skills used to address. Clinician and group members normalized relapse as possible part of recovery and provided encouragement to group members for honesty around cravings and use. Clinician facilitated group discussion around recovery being selfish and addressing guilt when setting boundaries with friends and family to protect sobriety.   Group Time: 2:30-4pm Participation Level: Active Behavioral Response: Appropriate Type of Therapy: Psycho-educational Group     Clinician presented "Addiction Neuroscience 101" video from [place] [facility]. Clinician and group members discussed scientific link between addiction, dopamine, and motivation. Clinician and group members discussed examples of changes in motivation levels in their own recovery. Clinician and group members brain stormed personal activities to engage in which increase moments of joy, stimulating the pleasure center of the brain. Clinician reviewed diagnosis criteria and decision fatigue based on information from video. Group members identified self-care activity to support recovery to be completed before next session.    Summary: Client showed progress toward goal of achieving and maintaining sobriety AEB sobriety date remaining 12/19/20. Client reported MAT helpful over the weekend to manage cravings and eliminate use. Client also reported being responsible for her niece helped improve sense of responsibility and  avoidance of use. Client showed progress toward goal of building sober support system AEB attending one [name] meeting since last session, client needs continued support to attend in person vs virtual meeting. Client processed boundary concern with family member and was receptive to feedback from group to decrease engagement with known triggers. Client was receptive to psycho-education and able to relate criteria to current and previous behaviors in her addiction/recovery.   Family Program: Family present? No   Name of family member(s): NA  UDS collected: Yes Results: pending  AA/NA attended?: Yes  Sponsor?: No   [facility]-CIOP[facility] CHEM

## 2020-12-28 ENCOUNTER — Other Ambulatory Visit: Payer: Self-pay

## 2020-12-28 ENCOUNTER — Other Ambulatory Visit (HOSPITAL_COMMUNITY): Payer: No Typology Code available for payment source | Admitting: Licensed Clinical Social Worker

## 2020-12-28 DIAGNOSIS — F331 Major depressive disorder, recurrent, moderate: Secondary | ICD-10-CM

## 2020-12-28 DIAGNOSIS — F1020 Alcohol dependence, uncomplicated: Secondary | ICD-10-CM

## 2020-12-28 DIAGNOSIS — F4310 Post-traumatic stress disorder, unspecified: Secondary | ICD-10-CM

## 2020-12-28 DIAGNOSIS — F1220 Cannabis dependence, uncomplicated: Secondary | ICD-10-CM

## 2020-12-29 ENCOUNTER — Other Ambulatory Visit (HOSPITAL_COMMUNITY): Payer: Self-pay

## 2020-12-29 ENCOUNTER — Other Ambulatory Visit (HOSPITAL_COMMUNITY): Payer: No Typology Code available for payment source | Admitting: Licensed Clinical Social Worker

## 2020-12-29 ENCOUNTER — Other Ambulatory Visit: Payer: Self-pay

## 2020-12-29 ENCOUNTER — Telehealth (INDEPENDENT_AMBULATORY_CARE_PROVIDER_SITE_OTHER): Payer: No Typology Code available for payment source | Admitting: Psychiatry

## 2020-12-29 DIAGNOSIS — F5105 Insomnia due to other mental disorder: Secondary | ICD-10-CM | POA: Diagnosis not present

## 2020-12-29 DIAGNOSIS — F1020 Alcohol dependence, uncomplicated: Secondary | ICD-10-CM

## 2020-12-29 DIAGNOSIS — F411 Generalized anxiety disorder: Secondary | ICD-10-CM

## 2020-12-29 DIAGNOSIS — F4310 Post-traumatic stress disorder, unspecified: Secondary | ICD-10-CM

## 2020-12-29 DIAGNOSIS — F1220 Cannabis dependence, uncomplicated: Secondary | ICD-10-CM

## 2020-12-29 DIAGNOSIS — F99 Mental disorder, not otherwise specified: Secondary | ICD-10-CM

## 2020-12-29 DIAGNOSIS — F332 Major depressive disorder, recurrent severe without psychotic features: Secondary | ICD-10-CM

## 2020-12-29 DIAGNOSIS — F1210 Cannabis abuse, uncomplicated: Secondary | ICD-10-CM

## 2020-12-29 MED ORDER — VENLAFAXINE HCL ER 75 MG PO CP24
ORAL_CAPSULE | Freq: Every day | ORAL | 0 refills | Status: DC
  Filled 2020-12-29: qty 270, 90d supply, fill #0
  Filled 2021-01-11: qty 90, 30d supply, fill #0
  Filled ????-??-??: fill #0

## 2020-12-29 MED ORDER — MIRTAZAPINE 30 MG PO TABS
ORAL_TABLET | Freq: Every day | ORAL | 0 refills | Status: DC
  Filled 2020-12-29: qty 180, 90d supply, fill #0

## 2020-12-29 MED ORDER — CLONIDINE HCL 0.1 MG PO TABS
ORAL_TABLET | Freq: Three times a day (TID) | ORAL | 0 refills | Status: DC
Start: 2020-12-29 — End: 2021-02-02
  Filled 2020-12-29: qty 270, 90d supply, fill #0

## 2020-12-29 NOTE — Progress Notes (Signed)
Virtual Visit via Video Note  I connected with Amber Craig on 12/29/20 at  9:00 AM EDT by a video enabled telemedicine application and verified that I am speaking with the correct person using two identifiers.  Location: Patient: home Provider: office   I discussed the limitations of evaluation and management by telemedicine and the availability of in person appointments. The patient expressed understanding and agreed to proceed.  History of Present Illness: Amber Craig is currently in IOP and she feels it has been really helpful. She is now at the start of her 2nd week of being sober. She is taking Naltrexone and Antabuse. She is still having cravings. She will return to work on 5/25. Her mood is ok but yesterday she was irritable and teary. It is the same every month around her period. Her sleep is good. She missed a couple of days of Remeron and Clonidine while watching her niece. She did not sleep well those nights. Her anxiety is better because her stressors have decreased. Pt denies recent manic and hypomanic symptoms including periods of decreased need for sleep, increased energy, mood lability, impulsivity, FOI, and excessive spending. As long as she keeps a regular sleep schedule then she prevents any episodes from starting. Her PTSD is unchanged. She denies any current SI/HI. She admits that yesterday she has brief flash of SI when really feeling really down then went away. The Naltrexone is causing nausea and decreased appetite and she is only eating a few fruits and maybe 2 slices of pizza in the last 4 days. Amber Craig is hoping that it will improve in another week or so. She is tolerating her other meds.     Observations/Objective: Psychiatric Specialty Exam: ROS  There were no vitals taken for this visit.There is no height or weight on file to calculate BMI.  General Appearance: Casual  Eye Contact:  Good  Speech:  Clear and Coherent and Normal Rate  Volume:  Normal  Mood:  Anxious and  Depressed  Affect:  Full Range  Thought Process:  Goal Directed, Linear and Descriptions of Associations: Intact  Orientation:  Full (Time, Place, and Person)  Thought Content:  Logical  Suicidal Thoughts:  No  Homicidal Thoughts:  No  Memory:  Immediate;   Good  Judgement:  Good  Insight:  Good  Psychomotor Activity:  Normal  Concentration:  Concentration: Good  Recall:  Good  Fund of Knowledge:  Good  Language:  Good  Akathisia:  No  Handed:  Right  AIMS (if indicated):     Assets:  Communication Skills Desire for Improvement Financial Resources/Insurance Housing Leisure Time Physical Health Resilience Social Support Talents/Skills Transportation Vocational/Educational  ADL's:  Intact  Cognition:  WNL  Sleep:        Assessment and Plan: Depression screen [facility] 2/9 12/29/2020 11/01/2020 11/01/2020 10/27/2020 08/03/2015  Decreased Interest 0 3 0 0 3  Down, Depressed, Hopeless 1 2 2  0 2  PHQ - 2 Score 1 5 2  0 5  Altered sleeping 0 2 2 - 3  Tired, decreased energy 0 2 2 - 3  Change in appetite 3 0 0 - 3  Feeling bad or failure about yourself  1 3 3  - 3  Trouble concentrating 0 3 3 - 3  Moving slowly or fidgety/restless 0 3 3 - 3  Suicidal thoughts 0 1 1 - 1  PHQ-9 Score 5 19 16  - 24  Difficult doing work/chores Somewhat difficult Somewhat difficult Somewhat difficult - Very difficult  Flowsheet Row Video Visit from 12/29/2020 in [place] from 11/01/2020 in [place] Video Visit from 10/27/2020 in [place] Error: Q3, 4, or 5 should not be populated when Q2 is No Error: Q3, 4, or 5 should not be populated when Q2 is No No Risk      1. PTSD (post-traumatic stress disorder) - cloNIDine (CATAPRES) 0.1 MG tablet; TAKE 1 TABLET BY MOUTH 3 TIMES DAILY  Dispense: 270 tablet; Refill: 0 - mirtazapine (REMERON) 30 MG tablet; TAKE 2  TABLETS BY MOUTH AT BEDTIME  Dispense: 180 tablet; Refill: 0 - venlafaxine XR (EFFEXOR-XR) 75 MG 24 hr capsule; TAKE 3 CAPSULES BY MOUTH DAILY  Dispense: 270 capsule; Refill: 0  2. GAD (generalized anxiety disorder) - cloNIDine (CATAPRES) 0.1 MG tablet; TAKE 1 TABLET BY MOUTH 3 TIMES DAILY  Dispense: 270 tablet; Refill: 0 - mirtazapine (REMERON) 30 MG tablet; TAKE 2 TABLETS BY MOUTH AT BEDTIME  Dispense: 180 tablet; Refill: 0 - venlafaxine XR (EFFEXOR-XR) 75 MG 24 hr capsule; TAKE 3 CAPSULES BY MOUTH DAILY  Dispense: 270 capsule; Refill: 0  3. Insomnia due to other mental disorder - cloNIDine (CATAPRES) 0.1 MG tablet; TAKE 1 TABLET BY MOUTH 3 TIMES DAILY  Dispense: 270 tablet; Refill: 0 - mirtazapine (REMERON) 30 MG tablet; TAKE 2 TABLETS BY MOUTH AT BEDTIME  Dispense: 180 tablet; Refill: 0  4. Severe episode of recurrent major depressive disorder, without psychotic features ([place]) - mirtazapine (REMERON) 30 MG tablet; TAKE 2 TABLETS BY MOUTH AT BEDTIME  Dispense: 180 tablet; Refill: 0 - venlafaxine XR (EFFEXOR-XR) 75 MG 24 hr capsule; TAKE 3 CAPSULES BY MOUTH DAILY  Dispense: 270 capsule; Refill: 0    Follow Up Instructions: In 4-6 weeks or sooner if needed   I discussed the assessment and treatment plan with the patient. The patient was provided an opportunity to ask questions and all were answered. The patient agreed with the plan and demonstrated an understanding of the instructions.   The patient was advised to call back or seek an in-person evaluation if the symptoms worsen or if the condition fails to improve as anticipated.    [name], MD

## 2020-12-29 NOTE — Progress Notes (Signed)
    Daily Group Progress Note  Program: CD-IOP   Group Time: 1pm-2:30pm  Participation Level: Minimal  Behavioral Response: Resistant, Evasive and Minimizing  Type of Therapy: Process Group  Topic: Clinician checked in with group members, assessing for SI/HI/psychosis and overall level of functioning, including cravings, relapse, and participation in community support meetings. Clinician explored with clients identifying and processing 'highs' and 'lows' since last group and processed any 'roadblocks' to recovery and skills used to address challenges. Clinician and group members read and processed AA's Daily Reflection and NA's Just For Today focused honesty with self and others in recovery. Group members processed shift in glorifying excessive substance abuse while "maintaining stability" and passively identifying self as addict to others vs self-acceptance and taking responsibility for addiction on a more personal level.     Group Time: 2:30pm-4pm  Participation Level: Minimal  Behavioral Response: Evasive  Type of Therapy: Psycho-education Group  Topic: Process Group/psycho-education provided Topic: Clinician presented psycho-education on "Four Most Common Reasons for Relapse in Early Recovery." Clinician and group discussed physical cravings, psychological longing, physical pain, and emotional pain as triggers for thoughts followed by possible cravings and use. Clients processed struggles in early recovery with different types of conditions and "surprise" triggers. Clinician reminded group members the importance of having a relapse prevention plan in place and utilizing plan immediately. Clinician worked with clients to review trigger-thought-craving-use cycle and group identified appropriate places for different types of interventions. Clinician presented DBT Distress Tolerance handout on Addict vs Clean vs Clear Mind. Clinician and group members reviewed "Five Common Challenges in  Early Recovery" from [facility] Int[facility] outpatient client workbook. Clinician inquired about self-care activity to support recovery before next group.    Summary: Client maintained sobriety date of 12/19/20, showing progress toward maintained sobriety. Client reported despite cravings she has been able to maintain sobriety with medication as deterant. Client processed with group 'random' day of extreme depression though noted it was easier to 'pull herself out of it' without the use of substances. Client identified "common reasons for continued use" rather than relapse as she had not previously achieved long term sobriety.   Family Program: Family present? No   Name of family member(s): NA  UDS collected: No Results: prescribed medications and tHc from 12/26/20 UDS  AA/NA attended?: Yes  Sponsor?: No   Olegario Messier, LCSW

## 2021-01-02 ENCOUNTER — Encounter (HOSPITAL_COMMUNITY): Payer: Self-pay | Admitting: Medical

## 2021-01-02 ENCOUNTER — Other Ambulatory Visit: Payer: Self-pay

## 2021-01-02 ENCOUNTER — Other Ambulatory Visit (HOSPITAL_COMMUNITY): Payer: No Typology Code available for payment source | Admitting: Licensed Clinical Social Worker

## 2021-01-02 DIAGNOSIS — F17200 Nicotine dependence, unspecified, uncomplicated: Secondary | ICD-10-CM | POA: Diagnosis not present

## 2021-01-02 DIAGNOSIS — Z62810 Personal history of physical and sexual abuse in childhood: Secondary | ICD-10-CM | POA: Diagnosis not present

## 2021-01-02 DIAGNOSIS — F1220 Cannabis dependence, uncomplicated: Secondary | ICD-10-CM

## 2021-01-02 DIAGNOSIS — F419 Anxiety disorder, unspecified: Secondary | ICD-10-CM | POA: Diagnosis not present

## 2021-01-02 DIAGNOSIS — F4312 Post-traumatic stress disorder, chronic: Secondary | ICD-10-CM | POA: Diagnosis not present

## 2021-01-02 DIAGNOSIS — F331 Major depressive disorder, recurrent, moderate: Secondary | ICD-10-CM | POA: Diagnosis present

## 2021-01-02 DIAGNOSIS — Z6372 Alcoholism and drug addiction in family: Secondary | ICD-10-CM | POA: Diagnosis not present

## 2021-01-02 DIAGNOSIS — F1020 Alcohol dependence, uncomplicated: Secondary | ICD-10-CM

## 2021-01-02 DIAGNOSIS — F5105 Insomnia due to other mental disorder: Secondary | ICD-10-CM | POA: Diagnosis not present

## 2021-01-02 DIAGNOSIS — Z791 Long term (current) use of non-steroidal anti-inflammatories (NSAID): Secondary | ICD-10-CM | POA: Diagnosis not present

## 2021-01-02 DIAGNOSIS — Z9189 Other specified personal risk factors, not elsewhere classified: Secondary | ICD-10-CM | POA: Diagnosis not present

## 2021-01-02 DIAGNOSIS — F341 Dysthymic disorder: Secondary | ICD-10-CM | POA: Diagnosis not present

## 2021-01-02 DIAGNOSIS — F4310 Post-traumatic stress disorder, unspecified: Secondary | ICD-10-CM

## 2021-01-02 DIAGNOSIS — F332 Major depressive disorder, recurrent severe without psychotic features: Secondary | ICD-10-CM | POA: Diagnosis not present

## 2021-01-02 DIAGNOSIS — Z79899 Other long term (current) drug therapy: Secondary | ICD-10-CM | POA: Diagnosis not present

## 2021-01-02 DIAGNOSIS — Z62811 Personal history of psychological abuse in childhood: Secondary | ICD-10-CM | POA: Diagnosis not present

## 2021-01-02 NOTE — Progress Notes (Signed)
Daily Group Progress Note  Program: CD-IOP   Group Time: 1pm-2:30pm Participation Level: Active Behavioral Response: Appropriate Type of Therapy: Process Group Topic: Clinician met with group members, assessing for SI/HI/psychosis and overall level of functioning. Clinician inquired about recent sobriety date and number of 12 step community meetings attended. Clinician and group members processed things going well and not well since last session. Clinician and group members processed any recent triggers or challenges to sobriety including fluctuating levels of motivation, increased intensity of mental health symptoms, and physical health. Clinician validated difficulty with mood sensitivity in early recovery and problems solved use of assertive communication to get needs met. Clinician and group members read and discussed NA Just for Today and AA Daily reflection focused on sharing with and receiving feedback from others in recovery. Clinician provided mindfulness activity for group participation in session.   Group Time: 2:30pm-4pm Participation Level: Active Behavioral Response: Appropriate Type of Therapy: Psycho-education Group Topic: Clinician presented the psycho-educational session topic of boundaries. Clinician and group members reviewed traits of loose, rigid and healthy boundaries and discussed examples of each. Clinician and group provided personal examples of healthy vs unhealthy boundaries and factors effecting setting and maintaining boundaries. Clinician and group members discussed "Common Boundary Myths." Clinician challenged group member thoughts/feelings related to control of personal boundaries vs attempted control of others' responses to boundaries. Clinician and group reflected on the need for placing boundaries with self, not just others. Clinician inquired about self-care activity to be completed prior to next session.   Summary: Client showed progress toward goal of  achieving/maintaining sobriety 7/7 days weekly AEB maintaining a sobriety date of 12/19/20. Client showed progress toward goal of building sober support community AEB attending 1 AA meeting however has not met goal of attending in person session. Client processed struggle with passive SI (no plan/intent) and frustration with lack of enjoyment with sobriety, anger, and continued loss of appetite. Client needs continued support on goal of utilizing distress tolerance skills for emotional regulation. Client identified with psycho-ed topic by sharing current boundary style and reasons. Client identified providing more ultimatums than healthy boundaries and noted needing to set more boundaries with self and address unhealthy guilt related to setting and maintaining boundaries for personal wellness and to support recovery. Client self care plan to attend at least 1 meeting before next session.   Family Program: Family present? No   Name of family member(s): NA  UDS collected: No Results: prescribed medications, decreased level of THC  AA/NA attended?: Yes  Sponsor?: No   Karissa A Brone, LCSW        

## 2021-01-04 ENCOUNTER — Other Ambulatory Visit: Payer: Self-pay

## 2021-01-04 ENCOUNTER — Other Ambulatory Visit (HOSPITAL_COMMUNITY): Payer: No Typology Code available for payment source

## 2021-01-04 NOTE — Progress Notes (Signed)
    Daily Group Progress Note  Program: CD-IOP   Group Time: 1pm-2:30pm Participation Level: Active Behavioral Response: Appropriate and Sharing Type of Therapy: Process Group Topic: Clinician checked in with group members, assessing for SI/HI/psychosis and overall level of functioning including difficulties with cravings or relapse. Clinician and group members processed 'highs and lows' since last group. Clinician facilitated processing discussion on recent challenges in early recovery and encouraged group feedback and problem solving. Clinician assisted group with reframing and validated difficulties in early recovery. Clinician and group members read AA daily reflection and NA Just for today with focus on journaling and fear and its relevance to current place in recovery.     Group Time: 2:30pm-4pm Participation Level: Active Behavioral Response: Appropriate Type of Therapy: Psycho-education Group Topic: Clinician presented the psycho-educational topic of addressing Anger. Clinician and group members discussed anger as a secondary emotion and how/where expression of anger was learned and accepted growing up. Clinician and group members identified physical symptoms of 'small' anger through 'overwhelming' anger. Clinician encouraged group members to be mindful of body sensations to address uncomfortable feelings individually at a less intense level. Clinician and group members started recognizing anger triggers in different settings, and how anger is felt in the body at different intensities. Clinician utilized [facility] Understanding Anger: Tips for Managing Anger worksheet. Clinician presented [employer], focused on healthy communication during disagreements. Clinician inquired about self care activity to support recovery to be completed over the weekend.   Summary: Client showed progress toward goal of achieving/maintaining sobriety 7/7 days weekly AEB maintaining a sobriety date of  12/19/20. Client showed progress toward goal of building sober support community AEB attending 3 AA meeting however has not met goal of attending in person session. Client processed struggle with anger and how she learned how to show anger as a child. Client needs continued support on goal of utilizing distress tolerance skills for emotional regulation.    Family Program: Family present? No   Name of family member(s): NA  UDS collected: Yes Results: pending  AA/NA attended?: Yes  Sponsor?: No   Olegario Messier, LCSW

## 2021-01-05 ENCOUNTER — Other Ambulatory Visit (HOSPITAL_COMMUNITY): Payer: No Typology Code available for payment source | Admitting: Licensed Clinical Social Worker

## 2021-01-05 NOTE — Progress Notes (Signed)
    Daily Group Progress Note  Program: CD-IOP   Group Time: 1pm-2pm Participation Level: Active Behavioral Response: Appropriate and Sharing Type of Therapy: Process Group Topic: Clinician checked in with group members, assessing for SI/HI/psychosis and overall level of functioning including difficulties with cravings or relapse. Clinician and group members processed 'highs and lows' since last group. Group members were provided time to process challenges to recovery and receive feedback from group members. Clinician and group members read AA Daily Reflection and Just for Today   Group Time: 2pm-4pm Participation Level: Active Behavioral Response: Appropriate Type of Therapy: Psycho-education Group Topic: Clinician presented the topic of resentment and forgiveness and its possible effect on relapse in recovery. Clinician and group members discussed situations leading to resentment, additional feelings, thoughts, and behaviors related to resentment. Clinician and group members identified core believes and processed how a violation of these by self or others can lead to guilt or resentment. Clinician and group members processed violating boundaries is more likely when actively using substances. Clinician and group members completed worksheet identifying emotions linked with resentment, thoughts about self and others developed based on events supporting resentment. Clinician facilitated Empty Chair activity walking through what each group member is getting out of holding onto resentment and the changes in thought patterns resulting from forgiveness. Clinician and clients discussed acceptance vs forgiveness for situations out of personal control.  Summary: Client reported a sobriety date of 11/19/20, showing some progress toward goal of achieving and maintaining sobriety however needs support with maintaining long term sobriety. Client showed some progress toward goal of building sober support system  AEB attendance at virtual [place] meetings, client has goal of attending in person meetings. Client shared progress from individual therapy working on resentment related to interactions with family members as well as stuck points. Client identified goals of addressing generational trauma and frustration with not feeling heard or engaged with the topic. Client was able to provide supportive feedback to other group members.   Family Program: Family present? No   Name of family member(s): NA  UDS collected: No Results: pending  AA/NA attended?: Yes  Sponsor?: No   Olegario Messier, LCSW

## 2021-01-09 ENCOUNTER — Other Ambulatory Visit (HOSPITAL_COMMUNITY): Payer: No Typology Code available for payment source

## 2021-01-09 ENCOUNTER — Other Ambulatory Visit: Payer: Self-pay

## 2021-01-10 NOTE — Progress Notes (Unsigned)
    Daily Group Progress Note  Program: CD-IOP   Group Time: 1pm-2:30pm Participation Level: Active Behavioral Response: Appropriate Type of Therapy: Process Group Topic: Clinician met with clients, assessing for SI/HI/psychosis and overall level of functioning including attendance of recovery meetings and relapse or challenges to sobriety. Clinician and group members discussed highs and lows from previous days and reflection on topic from previous day. Clinician and group members read Daily Reflection and clinician facilitated discussion on fear and denial in recovery vs acceptance and willingness for change.     Group Time: 2:30pm-4pm Participation Level: Active Behavioral Response: Appropriate Type of Therapy: Psycho-education Group Topic: Clinician provided psycho-educational group on PAWS. Clinician provided supplemental video with discussion on common PAWS symptoms and skills to help with management. Clinician utilized Post Acute Withdrawal (PAW) Self-Evaluation developed by Patrina Levering. Clinician and group members discussed the importance of being aware and tracking symptoms during recovery. Clinician provided supplemental video of information related to PAWS, common triggers, and relaxation techniques for management of symptoms. Clinician provided exercises for mindfulness as an alternative to meditations.   Summary: ***   Family Program: Family present? No   Name of family member(s): ***  UDS collected: No Results: {Findings; urine drug screen:60936}  AA/NA attended?: No{DAYS OF JJKK:93818}  Sponsor?: No   Olegario Messier, LCSW

## 2021-01-11 ENCOUNTER — Other Ambulatory Visit: Payer: Self-pay

## 2021-01-11 ENCOUNTER — Other Ambulatory Visit (HOSPITAL_COMMUNITY): Payer: No Typology Code available for payment source | Admitting: Licensed Clinical Social Worker

## 2021-01-11 ENCOUNTER — Other Ambulatory Visit (HOSPITAL_COMMUNITY): Payer: Self-pay

## 2021-01-11 DIAGNOSIS — F4310 Post-traumatic stress disorder, unspecified: Secondary | ICD-10-CM

## 2021-01-11 DIAGNOSIS — F1020 Alcohol dependence, uncomplicated: Secondary | ICD-10-CM

## 2021-01-11 DIAGNOSIS — F1220 Cannabis dependence, uncomplicated: Secondary | ICD-10-CM | POA: Diagnosis not present

## 2021-01-11 DIAGNOSIS — F332 Major depressive disorder, recurrent severe without psychotic features: Secondary | ICD-10-CM

## 2021-01-11 MED FILL — Naltrexone HCl Tab 50 MG: ORAL | 30 days supply | Qty: 30 | Fill #0 | Status: AC

## 2021-01-12 ENCOUNTER — Other Ambulatory Visit: Payer: Self-pay

## 2021-01-12 ENCOUNTER — Other Ambulatory Visit (HOSPITAL_COMMUNITY): Payer: Self-pay

## 2021-01-12 ENCOUNTER — Encounter (HOSPITAL_COMMUNITY): Payer: Self-pay | Admitting: Medical

## 2021-01-12 ENCOUNTER — Other Ambulatory Visit (HOSPITAL_COMMUNITY): Payer: No Typology Code available for payment source | Admitting: Licensed Clinical Social Worker

## 2021-01-12 DIAGNOSIS — F332 Major depressive disorder, recurrent severe without psychotic features: Secondary | ICD-10-CM

## 2021-01-12 DIAGNOSIS — F341 Dysthymic disorder: Secondary | ICD-10-CM

## 2021-01-12 DIAGNOSIS — Z9189 Other specified personal risk factors, not elsewhere classified: Secondary | ICD-10-CM

## 2021-01-12 DIAGNOSIS — F4312 Post-traumatic stress disorder, chronic: Secondary | ICD-10-CM

## 2021-01-12 DIAGNOSIS — Z811 Family history of alcohol abuse and dependence: Secondary | ICD-10-CM

## 2021-01-12 DIAGNOSIS — Z62810 Personal history of physical and sexual abuse in childhood: Secondary | ICD-10-CM

## 2021-01-12 DIAGNOSIS — F99 Mental disorder, not otherwise specified: Secondary | ICD-10-CM

## 2021-01-12 DIAGNOSIS — F1220 Cannabis dependence, uncomplicated: Secondary | ICD-10-CM | POA: Diagnosis not present

## 2021-01-12 DIAGNOSIS — F4310 Post-traumatic stress disorder, unspecified: Secondary | ICD-10-CM

## 2021-01-12 DIAGNOSIS — F5105 Insomnia due to other mental disorder: Secondary | ICD-10-CM

## 2021-01-12 DIAGNOSIS — F17200 Nicotine dependence, unspecified, uncomplicated: Secondary | ICD-10-CM

## 2021-01-12 DIAGNOSIS — F411 Generalized anxiety disorder: Secondary | ICD-10-CM

## 2021-01-12 DIAGNOSIS — Z62811 Personal history of psychological abuse in childhood: Secondary | ICD-10-CM

## 2021-01-12 DIAGNOSIS — F419 Anxiety disorder, unspecified: Secondary | ICD-10-CM

## 2021-01-12 DIAGNOSIS — F1020 Alcohol dependence, uncomplicated: Secondary | ICD-10-CM

## 2021-01-12 MED ORDER — DISULFIRAM 250 MG PO TABS
ORAL_TABLET | ORAL | 1 refills | Status: DC
  Filled 2021-01-12 – 2021-01-25 (×2): qty 8, 28d supply, fill #0
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
## 2021-01-16 ENCOUNTER — Encounter (HOSPITAL_COMMUNITY): Payer: No Typology Code available for payment source

## 2021-01-18 ENCOUNTER — Encounter (HOSPITAL_COMMUNITY): Payer: No Typology Code available for payment source

## 2021-01-19 ENCOUNTER — Encounter (HOSPITAL_COMMUNITY): Payer: No Typology Code available for payment source

## 2021-01-23 NOTE — Progress Notes (Signed)
    Daily Group Progress Note  Program: CD-IOP   Group Time: 1pm-2:30pm  Participation Level: Active  Behavioral Response: Appropriate  Type of Therapy: Process Group  Topic: Clinician checked in with group members, assessing for SI/HI/psychosis and overall level of functioning. Clinician and group members discussed any trouble with cravings or barriers to successful recovery. Clinician and group members reviewed and discussed AA Daily Reflection and NA Just for today on fear/pride/ego as a role of self-destruction which needed to be addressed as well as having put recovery in action, not just listening in relation to current stage in recovery. and processed comments in relation to current stage in recovery.         Group Time: 2:30pm-4pm  Participation Level: Active  Behavioral Response: Appropriate  Type of Therapy: Psycho-education Group  Topic: Clinician utilized psycho-educational session to focus on re-wiring the brain. Clinician utilized supportive material from Dr Halford Chessman 'Happy Brain: How to Overcome Our Neural Predisposition to Suffering.' Clinician and group members discussed natural bias and ways to support healthy thinking.  Clinician and group members practiced adding moments of mindfulness, gratitude, and self-compassion into daily life to support improved mood and long term positive interactions with the world. Clinician presented DBT skill 'Letting Go of Emotional Suffering: Mindfulness of Your Current Emotion.' Group practiced with clinician skills to regulate emotional intensity. Clinician inquired about self care plan for the weekend.   Summary: Client completes CDIOP successfully this day, with sobriety date of 12/19/20 showing progress toward goal of achieving and maintaining sobriety 7/7 days weekly. Client showed progress of building positive sober support system AEB re-engaging with spiritual community. Client reported overall improved mood and increased ability  to 'bounce back' from difficult days/situations. Client found DBT skill relevant and was able to engage in role play in session related to use of skill. Client will return to outpatient therapy and medication management.   Family Program: Family present? No   Name of family member(s): NA  UDS collected: No Results: NA AA/NA attended?: Yes  Sponsor?: No   Olegario Messier, LCSW

## 2021-01-24 NOTE — Progress Notes (Signed)
    Daily Group Progress Note  Program: CD-IOP   Group Time: 1pm-2:30pm Participation Level: Active Behavioral Response: Appropriate and Sharing Type of Therapy: Process Group Topic: Clinician checked in with clients, assessing for SI/HI/psychosis and overall level of functioning. Clinician and group members discussed 'highs' and 'lows' since last session. Clinician processed with group members ongoing triggers and challenges. Clients reflected on utilization of skills and education from group outside of session. Clinician and group members read and responded to [place] Daily Reflection on making amends and accepting responsibility for personal behavior, and NA reflection related freedom in recovery.      Group Time: 2:30pm-4pm Participation Level: Active Behavioral Response: Appropriate and Sharing Type of Therapy: Psycho-educational Group Topic: Clinician provided Loving Kindness Meditation in session. Clinician presented psycho-educational material on Distorted thinking styles. Clinician and group members reviewed examples of each distortion. Clinician reviewed CBT triangle and the connection of thoughts, feelings, and behaviors. Clinician and group members explored ways to challenge distorted thoughts to improve healthy behaviors to support sobriety. Clinician and group members identified recent cognitive distortions and practiced in session creating alternate thought patterns.  Clinician inquired about self-care activity planned before the next group to support recovery.   Summary: Client maintained a sobriety date of 12/19/20 showing progress toward achieving and maintaining sobriety 7/7 days weekly. Client attended 2 community support meetings showing progress toward goal of building sober support community. Client identified ongoing cognitive distortions but was able to create alternative, healthy thoughts in session. Client notes problem with noticing unhealthy thoughts in the moment to  create alternative behaviors, is more likely to re-frame after an uncomfortable situation.   Family Program: Family present? No   Name of family member(s): NA  UDS collected: No Results: pending  AA/NA attended?: Yes Sponsor?: No   Olegario Messier, LCSW

## 2021-01-25 ENCOUNTER — Encounter (HOSPITAL_COMMUNITY): Payer: No Typology Code available for payment source

## 2021-01-25 ENCOUNTER — Other Ambulatory Visit (HOSPITAL_COMMUNITY): Payer: Self-pay

## 2021-01-25 MED FILL — Aluminum Chloride Soln 20%: CUTANEOUS | 30 days supply | Qty: 60 | Fill #0 | Status: CN

## 2021-01-25 MED FILL — Azelastine HCl Ophth Soln 0.05%: OPHTHALMIC | 30 days supply | Qty: 6 | Fill #0 | Status: AC

## 2021-01-25 MED FILL — Ipratropium Bromide Nasal Soln 0.03% (21 MCG/SPRAY): NASAL | 30 days supply | Qty: 30 | Fill #0 | Status: AC

## 2021-01-25 MED FILL — Levocetirizine Dihydrochloride Tab 5 MG: ORAL | 30 days supply | Qty: 30 | Fill #0 | Status: AC

## 2021-01-25 MED FILL — Aluminum Chloride Soln 20%: CUTANEOUS | 30 days supply | Qty: 60 | Fill #0 | Status: AC

## 2021-01-26 ENCOUNTER — Other Ambulatory Visit (HOSPITAL_COMMUNITY): Payer: Self-pay

## 2021-01-26 ENCOUNTER — Encounter (HOSPITAL_COMMUNITY): Payer: No Typology Code available for payment source

## 2021-01-27 ENCOUNTER — Other Ambulatory Visit (HOSPITAL_COMMUNITY): Payer: Self-pay

## 2021-01-30 ENCOUNTER — Encounter (HOSPITAL_COMMUNITY): Payer: No Typology Code available for payment source

## 2021-02-01 ENCOUNTER — Encounter (HOSPITAL_COMMUNITY): Payer: No Typology Code available for payment source

## 2021-02-02 ENCOUNTER — Telehealth (INDEPENDENT_AMBULATORY_CARE_PROVIDER_SITE_OTHER): Payer: No Typology Code available for payment source | Admitting: Psychiatry

## 2021-02-02 ENCOUNTER — Other Ambulatory Visit: Payer: Self-pay

## 2021-02-02 ENCOUNTER — Other Ambulatory Visit (HOSPITAL_COMMUNITY): Payer: Self-pay

## 2021-02-02 DIAGNOSIS — F4310 Post-traumatic stress disorder, unspecified: Secondary | ICD-10-CM

## 2021-02-02 DIAGNOSIS — F99 Mental disorder, not otherwise specified: Secondary | ICD-10-CM

## 2021-02-02 DIAGNOSIS — F5105 Insomnia due to other mental disorder: Secondary | ICD-10-CM | POA: Diagnosis not present

## 2021-02-02 DIAGNOSIS — F1020 Alcohol dependence, uncomplicated: Secondary | ICD-10-CM | POA: Diagnosis not present

## 2021-02-02 DIAGNOSIS — F411 Generalized anxiety disorder: Secondary | ICD-10-CM

## 2021-02-02 DIAGNOSIS — F332 Major depressive disorder, recurrent severe without psychotic features: Secondary | ICD-10-CM

## 2021-02-02 MED ORDER — MIRTAZAPINE 30 MG PO TABS
ORAL_TABLET | Freq: Every day | ORAL | 0 refills | Status: DC
  Filled 2021-02-02: qty 60, 30d supply, fill #0
  Filled 2021-02-22: qty 60, 30d supply, fill #1

## 2021-02-02 MED ORDER — VENLAFAXINE HCL ER 75 MG PO CP24
ORAL_CAPSULE | Freq: Every day | ORAL | 0 refills | Status: DC
  Filled 2021-02-02: qty 270, 90d supply, fill #0
  Filled 2021-02-10: qty 90, 30d supply, fill #0
  Filled 2021-03-17: qty 90, 30d supply, fill #1

## 2021-02-02 MED ORDER — DISULFIRAM 250 MG PO TABS
250.0000 mg | ORAL_TABLET | Freq: Every day | ORAL | 1 refills | Status: DC
  Filled 2021-02-02 – 2021-02-03 (×2): qty 30, 30d supply, fill #0
  Filled 2021-03-17: qty 30, 30d supply, fill #1

## 2021-02-02 MED ORDER — NALTREXONE HCL 50 MG PO TABS
ORAL_TABLET | Freq: Every day | ORAL | 1 refills | Status: DC
Start: 2021-02-02 — End: 2021-03-23
  Filled 2021-02-02 – 2021-03-17 (×2): qty 30, 30d supply, fill #0

## 2021-02-02 MED ORDER — CLONIDINE HCL 0.1 MG PO TABS
ORAL_TABLET | Freq: Three times a day (TID) | ORAL | 0 refills | Status: DC
Start: 2021-02-02 — End: 2021-03-23
  Filled 2021-02-02: qty 270, 90d supply, fill #0

## 2021-02-02 NOTE — Progress Notes (Signed)
Virtual Visit via Video Note  I connected with Amber Craig on 02/02/21 at  1:30 PM EDT by a video enabled telemedicine application and verified that I am speaking with the correct person using two identifiers.  Location: Patient: home Provider: office   I discussed the limitations of evaluation and management by telemedicine and the availability of in person appointments. The patient expressed understanding and agreed to proceed.  History of Present Illness: "Doing good". She is back to work. She is now 39 days sober. She is doing Careers adviser. Several of them are international meetings but they are helpful. She is taking Naltrexone and Antabuse. The cravings are situational and she is able to control her response.  Amber Craig has only felt depressed a few hours a couple of days in the last 2 weeks. She denies isolation and anhedonia. She is sleeping and eating well. Amber Craig denies SI/HI. Amber Craig fears a very stressful occurring that would cause her to fall off the wagon. IOP has been very helpful. She has an eviction notice due to being out for IOP. She was really worried but got support from family and her church. The finances are still being worked on but she is hopeful. She is working overtime and her Theodoro Kos is giving her something and she is waiting on the lump sum for disability. This has forced her to talk to people to manage her anxiety. She is proud that she did not relapse. Bryan states her PTSD is starting to get better because she is more aware of her emotions and body so that she can deal with it soon.    Observations/Objective: Psychiatric Specialty Exam: ROS  There were no vitals taken for this visit.There is no height or weight on file to calculate BMI.  General Appearance: Casual and Neat  Eye Contact:  Good  Speech:  Clear and Coherent and Normal Rate  Volume:  Normal  Mood:  Euthymic  Affect:  Full Range  Thought Process:  Goal Directed, Linear, and Descriptions of  Associations: Intact  Orientation:  Full (Time, Place, and Person)  Thought Content:  Logical  Suicidal Thoughts:  No  Homicidal Thoughts:  No  Memory:  Immediate;   Good  Judgement:  Good  Insight:  Good  Psychomotor Activity:  Normal  Concentration:  Concentration: Good  Recall:  Good  Fund of Knowledge:  Good  Language:  Good  Akathisia:  No  Handed:  Right  AIMS (if indicated):     Assets:  Communication Skills Desire for Improvement Financial Resources/Insurance Housing Resilience Social Support Talents/Skills Transportation Vocational/Educational  ADL's:  Intact  Cognition:  WNL  Sleep:        Assessment and Plan: Depression screen [facility] 2/9 02/02/2021 12/29/2020 11/01/2020 11/01/2020 10/27/2020  Decreased Interest 0 0 3 0 0  Down, Depressed, Hopeless 1 1 2 2  0  PHQ - 2 Score 1 1 5 2  0  Altered sleeping 0 0 2 2 -  Tired, decreased energy 0 0 2 2 -  Change in appetite 0 3 0 0 -  Feeling bad or failure about yourself  1 1 3 3  -  Trouble concentrating 0 0 3 3 -  Moving slowly or fidgety/restless 0 0 3 3 -  Suicidal thoughts 0 0 1 1 -  PHQ-9 Score 2 5 19 16  -  Difficult doing work/chores Somewhat difficult Somewhat difficult Somewhat difficult Somewhat difficult -    Flowsheet Row Video Visit from 02/02/2021 in [place]  [facility] Video Visit from 12/29/2020 in [place] [facility] Counselor from 11/01/2020 in [place] CATEGORY Error: Q3, 4, or 5 should not be populated when Q2 is No Error: Q3, 4, or 5 should not be populated when Q2 is No Error: Q3, 4, or 5 should not be populated when Q2 is No      1. PTSD (post-traumatic stress disorder) - cloNIDine (CATAPRES) 0.1 MG tablet; TAKE 1 TABLET BY MOUTH 3 TIMES DAILY  Dispense: 270 tablet; Refill: 0 - mirtazapine (REMERON) 30 MG tablet; TAKE 2 TABLETS BY MOUTH AT BEDTIME  Dispense: 180 tablet; Refill: 0 - venlafaxine  XR (EFFEXOR-XR) 75 MG 24 hr capsule; TAKE 3 CAPSULES BY MOUTH DAILY  Dispense: 270 capsule; Refill: 0  2. GAD (generalized anxiety disorder) - cloNIDine (CATAPRES) 0.1 MG tablet; TAKE 1 TABLET BY MOUTH 3 TIMES DAILY  Dispense: 270 tablet; Refill: 0 - mirtazapine (REMERON) 30 MG tablet; TAKE 2 TABLETS BY MOUTH AT BEDTIME  Dispense: 180 tablet; Refill: 0 - venlafaxine XR (EFFEXOR-XR) 75 MG 24 hr capsule; TAKE 3 CAPSULES BY MOUTH DAILY  Dispense: 270 capsule; Refill: 0  3. Insomnia due to other mental disorder - cloNIDine (CATAPRES) 0.1 MG tablet; TAKE 1 TABLET BY MOUTH 3 TIMES DAILY  Dispense: 270 tablet; Refill: 0 - mirtazapine (REMERON) 30 MG tablet; TAKE 2 TABLETS BY MOUTH AT BEDTIME  Dispense: 180 tablet; Refill: 0  4. Severe episode of recurrent major depressive disorder, without psychotic features ([place]) - mirtazapine (REMERON) 30 MG tablet; TAKE 2 TABLETS BY MOUTH AT BEDTIME  Dispense: 180 tablet; Refill: 0 - venlafaxine XR (EFFEXOR-XR) 75 MG 24 hr capsule; TAKE 3 CAPSULES BY MOUTH DAILY  Dispense: 270 capsule; Refill: 0  5. Alcohol use disorder, severe, dependence (HCC) - disulfiram (ANTABUSE) 250 MG tablet; Take 1 tablet (250 mg total) by mouth daily.  Dispense: 30 tablet; Refill: 1 - naltrexone (DEPADE) 50 MG tablet; TAKE 1 TABLET BY MOUTH ONCE A DAY  Dispense: 30 tablet; Refill: 1    Follow Up Instructions: In 1-2 months or sooner if needed   I discussed the assessment and treatment plan with the patient. The patient was provided an opportunity to ask questions and all were answered. The patient agreed with the plan and demonstrated an understanding of the instructions.   The patient was advised to call back or seek an in-person evaluation if the symptoms worsen or if the condition fails to improve as anticipated.  I provided 18 minutes of non-face-to-face time during this encounter.   [name], MD

## 2021-02-03 ENCOUNTER — Other Ambulatory Visit (HOSPITAL_COMMUNITY): Payer: Self-pay

## 2021-02-06 ENCOUNTER — Encounter (HOSPITAL_COMMUNITY): Payer: No Typology Code available for payment source

## 2021-02-06 ENCOUNTER — Other Ambulatory Visit (HOSPITAL_COMMUNITY): Payer: Self-pay

## 2021-02-08 ENCOUNTER — Encounter (HOSPITAL_COMMUNITY): Payer: No Typology Code available for payment source

## 2021-02-09 ENCOUNTER — Encounter (HOSPITAL_COMMUNITY): Payer: No Typology Code available for payment source

## 2021-02-10 ENCOUNTER — Other Ambulatory Visit (HOSPITAL_COMMUNITY): Payer: Self-pay

## 2021-02-15 ENCOUNTER — Other Ambulatory Visit (HOSPITAL_COMMUNITY): Payer: Self-pay

## 2021-02-16 ENCOUNTER — Other Ambulatory Visit (HOSPITAL_COMMUNITY): Payer: Self-pay

## 2021-02-17 ENCOUNTER — Other Ambulatory Visit (HOSPITAL_COMMUNITY): Payer: Self-pay

## 2021-02-22 ENCOUNTER — Other Ambulatory Visit (HOSPITAL_COMMUNITY): Payer: Self-pay

## 2021-02-23 ENCOUNTER — Other Ambulatory Visit: Payer: Self-pay | Admitting: Family Medicine

## 2021-02-23 ENCOUNTER — Other Ambulatory Visit (HOSPITAL_COMMUNITY)
Admission: RE | Admit: 2021-02-23 | Discharge: 2021-02-23 | Disposition: A | Discharge status: Home / Self Care | Payer: No Typology Code available for payment source | Source: Ambulatory Visit | Attending: Family Medicine | Admitting: Family Medicine

## 2021-02-23 DIAGNOSIS — N898 Other specified noninflammatory disorders of vagina: Secondary | ICD-10-CM | POA: Diagnosis present

## 2021-02-28 ENCOUNTER — Other Ambulatory Visit (HOSPITAL_COMMUNITY): Payer: Self-pay

## 2021-02-28 LAB — MOLECULAR ANCILLARY ONLY
Bacterial Vaginitis (gardnerella): NEGATIVE
Candida Glabrata: NEGATIVE
Candida Vaginitis: NEGATIVE
Chlamydia: NEGATIVE
Comment: NEGATIVE
Comment: NEGATIVE
Comment: NEGATIVE
Comment: NEGATIVE
Comment: NEGATIVE
Comment: NORMAL
Neisseria Gonorrhea: NEGATIVE
Trichomonas: NEGATIVE

## 2021-03-17 ENCOUNTER — Other Ambulatory Visit (HOSPITAL_COMMUNITY): Payer: Self-pay

## 2021-03-17 MED FILL — Ipratropium Bromide Nasal Soln 0.03% (21 MCG/SPRAY): NASAL | 30 days supply | Qty: 30 | Fill #1 | Status: AC

## 2021-03-17 MED FILL — Azelastine HCl Ophth Soln 0.05%: OPHTHALMIC | 30 days supply | Qty: 6 | Fill #1 | Status: AC

## 2021-03-17 MED FILL — Levocetirizine Dihydrochloride Tab 5 MG: ORAL | 30 days supply | Qty: 30 | Fill #1 | Status: AC

## 2021-03-20 ENCOUNTER — Other Ambulatory Visit (HOSPITAL_COMMUNITY): Payer: Self-pay

## 2021-03-21 ENCOUNTER — Other Ambulatory Visit (HOSPITAL_COMMUNITY): Payer: Self-pay

## 2021-03-23 ENCOUNTER — Telehealth (INDEPENDENT_AMBULATORY_CARE_PROVIDER_SITE_OTHER): Payer: No Typology Code available for payment source | Admitting: Psychiatry

## 2021-03-23 ENCOUNTER — Other Ambulatory Visit (HOSPITAL_COMMUNITY): Payer: Self-pay

## 2021-03-23 ENCOUNTER — Other Ambulatory Visit: Payer: Self-pay

## 2021-03-23 DIAGNOSIS — F5105 Insomnia due to other mental disorder: Secondary | ICD-10-CM | POA: Diagnosis not present

## 2021-03-23 DIAGNOSIS — F4310 Post-traumatic stress disorder, unspecified: Secondary | ICD-10-CM | POA: Diagnosis not present

## 2021-03-23 DIAGNOSIS — F332 Major depressive disorder, recurrent severe without psychotic features: Secondary | ICD-10-CM | POA: Diagnosis not present

## 2021-03-23 DIAGNOSIS — F411 Generalized anxiety disorder: Secondary | ICD-10-CM | POA: Diagnosis not present

## 2021-03-23 DIAGNOSIS — F1021 Alcohol dependence, in remission: Secondary | ICD-10-CM

## 2021-03-23 DIAGNOSIS — F99 Mental disorder, not otherwise specified: Secondary | ICD-10-CM

## 2021-03-23 MED ORDER — CLONIDINE HCL 0.1 MG PO TABS
ORAL_TABLET | Freq: Three times a day (TID) | ORAL | 0 refills | Status: DC
  Filled 2021-03-23: qty 270, 90d supply, fill #0
  Filled 2021-05-11: qty 90, 30d supply, fill #0

## 2021-03-23 MED ORDER — MIRTAZAPINE 30 MG PO TABS
ORAL_TABLET | Freq: Every day | ORAL | 0 refills | Status: DC
  Filled 2021-03-23: qty 180, 90d supply, fill #0
  Filled 2021-05-11: qty 60, 30d supply, fill #0

## 2021-03-23 MED ORDER — NALTREXONE HCL 50 MG PO TABS
ORAL_TABLET | Freq: Every day | ORAL | 1 refills | Status: DC
  Filled 2021-03-23 – 2021-04-21 (×2): qty 30, 30d supply, fill #0
  Filled 2021-06-07: qty 30, 30d supply, fill #1

## 2021-03-23 MED ORDER — ATOMOXETINE HCL 25 MG PO CAPS
25.0000 mg | ORAL_CAPSULE | Freq: Two times a day (BID) | ORAL | 2 refills | Status: DC
  Filled 2021-03-23 – 2021-04-21 (×3): qty 60, 30d supply, fill #0
  Filled 2021-05-22: qty 60, 30d supply, fill #1

## 2021-03-23 MED ORDER — VENLAFAXINE HCL ER 75 MG PO CP24
ORAL_CAPSULE | Freq: Every day | ORAL | 0 refills | Status: DC
  Filled 2021-03-23: qty 270, 90d supply, fill #0
  Filled 2021-04-21 (×3): qty 90, 30d supply, fill #0
  Filled 2021-05-22: qty 90, 30d supply, fill #1

## 2021-03-23 NOTE — Progress Notes (Signed)
Virtual Visit via Video Note  I connected with Amber Craig on 03/23/21 at  8:30 AM EDT by a video enabled telemedicine application and verified that I am speaking with the correct person using two identifiers.  Location: Patient: work Provider: office   I discussed the limitations of evaluation and management by telemedicine and the availability of in person appointments. The patient expressed understanding and agreed to proceed.  History of Present Illness: "I am pretty good. Things have been good". She has not drank any alcohol in 4 weeks. Amber Craig has not smoked any marijuana 2 weeks. Prior it was every other day. She has been going to some [employer]. She is still taking Antabuse on days she is off. Amber Craig is taking the Naltrexone daily and her cravings for alcohol are mild and manageable. Her sleep is good with Remeron.  Amber Craig has been some mood swings and she noticed it when she was on her period. For about 2 weeks she was angry, irritable and crying on/off thru out day. Over the few days her mood has been more stable. Over the last 2 weeks she has experienced about 4 days where she is depressed. On those days she has low energy, low motivation, sad, irritable, tearful and isolates. She denies SI/HI. Amber Craig has racing thoughts and restlessness. She is talking fast and talking a lot. She has some irresponsible spending. She has paid the majority of her bills but is working extra days to pay off more of them. Her anxiety is situational. Her PTSD is worse on/off. Her HV is good on some days. She is able to sleep with her bedroom unlocked some nights. She may be experiencing some unknown trigger which causes fear and intrusive thoughts. She denies nightmares and flashbacks. Amber Craig is noticing some disassociation happening more frequently. It seems to be happening "all day, every day in some way". She is staring off in the space for a few minutes and has a hard time focusing at work. Amber Craig is  experiencing some thought blocking. It is made more aware to her by other peoples reactions to her. They tell her she is looking lost. This has been going on for many years.    Observations/Objective: Psychiatric Specialty Exam: ROS  There were no vitals taken for this visit.There is no height or weight on file to calculate BMI.  General Appearance: Fairly Groomed and Neat  Eye Contact:  Good  Speech:  Clear and Coherent and Normal Rate  Volume:  Normal  Mood:  Anxious  Affect:  Congruent  Thought Process:  Goal Directed, Linear, and Descriptions of Associations: Intact  Orientation:  Full (Time, Place, and Person)  Thought Content:  Logical  Suicidal Thoughts:  No  Homicidal Thoughts:  No  Memory:  Immediate;   Good  Judgement:  Good  Insight:  Good  Psychomotor Activity:  Normal  Concentration:  Concentration: Good  Recall:  Good  Fund of Knowledge:  Good  Language:  Good  Akathisia:  No  Handed:  Right  AIMS (if indicated):     Assets:  Communication Skills Desire for Improvement Financial Resources/Insurance Housing Leisure Time Resilience Social Support Talents/Skills Transportation Vocational/Educational  ADL's:  Intact  Cognition:  WNL  Sleep:        Assessment and Plan: Depression screen [facility] 2/9 03/23/2021 02/02/2021 12/29/2020 11/01/2020 11/01/2020  Decreased Interest 1 0 0 3 0  Down, Depressed, Hopeless '1 1 1 2 2  '$ PHQ - 2 Score '2 1 1 '$ 5  2  Altered sleeping 0 0 0 2 2  Tired, decreased energy 1 0 0 2 2  Change in appetite 0 0 3 0 0  Feeling bad or failure about yourself  '1 1 1 3 3  '$ Trouble concentrating 3 0 0 3 3  Moving slowly or fidgety/restless 0 0 0 3 3  Suicidal thoughts 0 0 0 1 1  PHQ-9 Score '7 2 5 19 16  '$ Difficult doing work/chores Somewhat difficult Somewhat difficult Somewhat difficult Somewhat difficult Somewhat difficult    Flowsheet Row Video Visit from 03/23/2021 in [place] [facility] Video Visit from 02/02/2021 in  [place] [facility] Video Visit from 12/29/2020 in [place] Error: Q3, 4, or 5 should not be populated when Q2 is No Error: Q3, 4, or 5 should not be populated when Q2 is No Error: Q3, 4, or 5 should not be populated when Q2 is No      I am wondering if Amber Craig has ADHD that she is describing as dissociation. I am not ruling out the possibility of this being relating to PTSD or even MDD. I will start Strattera to target the lack of focus and monitor progress.   1. Severe episode of recurrent major depressive disorder, without psychotic features (HCC) - venlafaxine XR (EFFEXOR-XR) 75 MG 24 hr capsule; TAKE 3 CAPSULES BY MOUTH DAILY  Dispense: 270 capsule; Refill: 0 - mirtazapine (REMERON) 30 MG tablet; TAKE 2 TABLETS BY MOUTH AT BEDTIME  Dispense: 180 tablet; Refill: 0 - atomoxetine (STRATTERA) 25 MG capsule; Take 1 capsule (25 mg total) by mouth 2 (two) times daily.  Dispense: 60 capsule; Refill: 2  2. PTSD (post-traumatic stress disorder) - venlafaxine XR (EFFEXOR-XR) 75 MG 24 hr capsule; TAKE 3 CAPSULES BY MOUTH DAILY  Dispense: 270 capsule; Refill: 0 - mirtazapine (REMERON) 30 MG tablet; TAKE 2 TABLETS BY MOUTH AT BEDTIME  Dispense: 180 tablet; Refill: 0 - cloNIDine (CATAPRES) 0.1 MG tablet; TAKE 1 TABLET BY MOUTH 3 TIMES DAILY  Dispense: 270 tablet; Refill: 0 - atomoxetine (STRATTERA) 25 MG capsule; Take 1 capsule (25 mg total) by mouth 2 (two) times daily.  Dispense: 60 capsule; Refill: 2  3. GAD (generalized anxiety disorder) - venlafaxine XR (EFFEXOR-XR) 75 MG 24 hr capsule; TAKE 3 CAPSULES BY MOUTH DAILY  Dispense: 270 capsule; Refill: 0 - mirtazapine (REMERON) 30 MG tablet; TAKE 2 TABLETS BY MOUTH AT BEDTIME  Dispense: 180 tablet; Refill: 0 - cloNIDine (CATAPRES) 0.1 MG tablet; TAKE 1 TABLET BY MOUTH 3 TIMES DAILY  Dispense: 270 tablet; Refill: 0  4. Insomnia due to other mental disorder -  mirtazapine (REMERON) 30 MG tablet; TAKE 2 TABLETS BY MOUTH AT BEDTIME  Dispense: 180 tablet; Refill: 0 - cloNIDine (CATAPRES) 0.1 MG tablet; TAKE 1 TABLET BY MOUTH 3 TIMES DAILY  Dispense: 270 tablet; Refill: 0  5. Alcohol use disorder, moderate, in early remission (HCC) - naltrexone (DEPADE) 50 MG tablet; TAKE 1 TABLET BY MOUTH ONCE A DAY  Dispense: 30 tablet; Refill: 1   Follow Up Instructions: In 2-3 months or sooner if needed   I discussed the assessment and treatment plan with the patient. The patient was provided an opportunity to ask questions and all were answered. The patient agreed with the plan and demonstrated an understanding of the instructions.   The patient was advised to call back or seek an in-person evaluation if the symptoms worsen or if the condition fails to  improve as anticipated.  I provided 28 minutes of non-face-to-face time during this encounter.   Amber Cradle, MD

## 2021-03-29 ENCOUNTER — Other Ambulatory Visit (HOSPITAL_COMMUNITY): Payer: Self-pay

## 2021-03-29 MED ORDER — LEVOCETIRIZINE DIHYDROCHLORIDE 5 MG PO TABS
ORAL_TABLET | ORAL | 3 refills | Status: DC
  Filled 2021-03-29: qty 60, 30d supply, fill #0
  Filled 2021-05-22: qty 60, 30d supply, fill #1
  Filled 2021-06-21: qty 60, 30d supply, fill #2
  Filled 2021-07-25: qty 60, 30d supply, fill #3

## 2021-04-03 ENCOUNTER — Other Ambulatory Visit (HOSPITAL_COMMUNITY): Payer: Self-pay

## 2021-04-06 ENCOUNTER — Other Ambulatory Visit (HOSPITAL_COMMUNITY): Payer: Self-pay

## 2021-04-10 ENCOUNTER — Other Ambulatory Visit (HOSPITAL_COMMUNITY): Payer: Self-pay

## 2021-04-21 ENCOUNTER — Other Ambulatory Visit (HOSPITAL_COMMUNITY): Payer: Self-pay | Admitting: Psychiatry

## 2021-04-21 ENCOUNTER — Other Ambulatory Visit (HOSPITAL_COMMUNITY): Payer: Self-pay

## 2021-04-21 DIAGNOSIS — F1020 Alcohol dependence, uncomplicated: Secondary | ICD-10-CM

## 2021-04-22 ENCOUNTER — Other Ambulatory Visit (HOSPITAL_COMMUNITY): Payer: Self-pay

## 2021-04-22 MED ORDER — DISULFIRAM 250 MG PO TABS
250.0000 mg | ORAL_TABLET | Freq: Every day | ORAL | 1 refills | Status: DC
  Filled 2021-04-22: qty 30, 30d supply, fill #0
  Filled 2021-06-07: qty 30, 30d supply, fill #1

## 2021-04-25 ENCOUNTER — Other Ambulatory Visit (HOSPITAL_COMMUNITY): Payer: Self-pay

## 2021-05-11 ENCOUNTER — Other Ambulatory Visit (HOSPITAL_COMMUNITY): Payer: Self-pay

## 2021-05-12 ENCOUNTER — Other Ambulatory Visit (HOSPITAL_COMMUNITY): Payer: Self-pay

## 2021-05-18 ENCOUNTER — Telehealth (HOSPITAL_COMMUNITY): Payer: No Typology Code available for payment source | Admitting: Psychiatry

## 2021-05-22 ENCOUNTER — Other Ambulatory Visit (HOSPITAL_COMMUNITY): Payer: Self-pay

## 2021-05-25 ENCOUNTER — Telehealth (HOSPITAL_BASED_OUTPATIENT_CLINIC_OR_DEPARTMENT_OTHER): Payer: No Typology Code available for payment source | Admitting: Psychiatry

## 2021-05-25 ENCOUNTER — Other Ambulatory Visit (HOSPITAL_COMMUNITY): Payer: Self-pay

## 2021-05-25 ENCOUNTER — Other Ambulatory Visit: Payer: Self-pay

## 2021-05-25 DIAGNOSIS — F5105 Insomnia due to other mental disorder: Secondary | ICD-10-CM

## 2021-05-25 DIAGNOSIS — F332 Major depressive disorder, recurrent severe without psychotic features: Secondary | ICD-10-CM | POA: Diagnosis not present

## 2021-05-25 DIAGNOSIS — F411 Generalized anxiety disorder: Secondary | ICD-10-CM | POA: Diagnosis not present

## 2021-05-25 DIAGNOSIS — F1021 Alcohol dependence, in remission: Secondary | ICD-10-CM

## 2021-05-25 DIAGNOSIS — F4310 Post-traumatic stress disorder, unspecified: Secondary | ICD-10-CM | POA: Diagnosis not present

## 2021-05-25 DIAGNOSIS — F99 Mental disorder, not otherwise specified: Secondary | ICD-10-CM

## 2021-05-25 MED ORDER — ATOMOXETINE HCL 40 MG PO CAPS
40.0000 mg | ORAL_CAPSULE | Freq: Two times a day (BID) | ORAL | 2 refills | Status: DC
  Filled 2021-05-25: qty 60, 30d supply, fill #0
  Filled 2021-06-30: qty 60, 30d supply, fill #1
  Filled 2021-08-04: qty 60, 30d supply, fill #2

## 2021-05-25 MED ORDER — VENLAFAXINE HCL ER 75 MG PO CP24
ORAL_CAPSULE | Freq: Every day | ORAL | 2 refills | Status: DC
  Filled 2021-05-25: qty 90, fill #0
  Filled 2021-06-21: qty 90, 30d supply, fill #0
  Filled 2021-07-25: qty 90, 30d supply, fill #1

## 2021-05-25 MED ORDER — CLONIDINE HCL 0.1 MG PO TABS
ORAL_TABLET | Freq: Three times a day (TID) | ORAL | 2 refills | Status: DC
  Filled 2021-05-25 – 2021-06-07 (×2): qty 90, 30d supply, fill #0
  Filled 2021-07-10: qty 90, 30d supply, fill #1
  Filled 2021-08-10: qty 90, 30d supply, fill #2

## 2021-05-25 MED ORDER — MIRTAZAPINE 30 MG PO TABS
ORAL_TABLET | Freq: Every day | ORAL | 2 refills | Status: DC
  Filled 2021-05-25: qty 60, fill #0
  Filled 2021-06-07: qty 60, 30d supply, fill #0
  Filled 2021-07-10: qty 60, 30d supply, fill #1
  Filled 2021-08-10: qty 60, 30d supply, fill #2

## 2021-05-25 NOTE — Progress Notes (Signed)
Virtual Visit via Video Note  I connected with Amber Craig on 05/25/21 at 10:15 AM EDT by a video enabled telemedicine application and verified that I am speaking with the correct person using two identifiers.  Location: Patient: home Provider: office   I discussed the limitations of evaluation and management by telemedicine and the availability of in person appointments. The patient expressed understanding and agreed to proceed.  History of Present Illness: "A little better". The Amber Craig is helping her focus more and she is force herself to sit still longer. Even though it is a small change it is still a big improved. Her mood was down last month due to work. By the end of the month she was better able to handle the work stress. She was irritated, frustrated and angry but it is better now. Her depression is mostly situational. Amber Craig is able to force herself to sleep at a good time on work days and she still sometimes over sleeps. Pt denies recent manic and hypomanic symptoms including periods of decreased need for sleep, increased energy, mood lability, impulsivity, FOI, and excessive spending. She denies SI/HI. If she notices something she is able to correct her behavior quickly. Her anxiety remains high at all times especially at work. She never feels safe work even though she knows she is safe on some level.  It is better at home. Her PTSD is a little better but she is unable to say how. She is using an alarm on her phone to help with med compliance and it is working. She is no longer drinking alcohol for the last 5 months. She takes Antabuse Friday- Monday. It works well but she doesn't think she needs it anymore. She is smoking marijuana daily to help her relax and she likes it. She denies any SE from meds.    Observations/Objective: Psychiatric Specialty Exam: ROS  There were no vitals taken for this visit.There is no height or weight on file to calculate BMI.  General Appearance: Casual  and Neat  Eye Contact:  Good  Speech:  Clear and Coherent and Normal Rate  Volume:  Normal  Mood:  Anxious and Depressed  Affect:  Full Range  Thought Process:  Goal Directed, Linear, and Descriptions of Associations: Intact  Orientation:  Full (Time, Place, and Person)  Thought Content:  Logical  Suicidal Thoughts:  No  Homicidal Thoughts:  No  Memory:  Immediate;   Good  Judgement:  Good  Insight:  Good  Psychomotor Activity:  Normal  Concentration:  Concentration: Good  Recall:  Good  Fund of Knowledge:  Good  Language:  Good  Akathisia:  No  Handed:  Right  AIMS (if indicated):     Assets:  Communication Skills Desire for Improvement Financial Resources/Insurance Housing Resilience Social Support Talents/Skills Transportation Vocational/Educational  ADL's:  Intact  Cognition:  WNL  Sleep:        Assessment and Plan: - no refill on Antabuse and Naltrexone today  - increase Strattera to 40mg  BID  1. GAD (generalized anxiety disorder) - cloNIDine (CATAPRES) 0.1 MG tablet; TAKE 1 TABLET BY MOUTH 3 TIMES DAILY  Dispense: 90 tablet; Refill: 2 - mirtazapine (REMERON) 30 MG tablet; TAKE 2 TABLETS BY MOUTH AT BEDTIME  Dispense: 60 tablet; Refill: 2 - venlafaxine XR (EFFEXOR-XR) 75 MG 24 hr capsule; TAKE 3 CAPSULES BY MOUTH DAILY  Dispense: 90 capsule; Refill: 2  2. Severe episode of recurrent major depressive disorder, without psychotic features (Amber Craig) - atomoxetine (STRATTERA) 40  MG capsule; Take 1 capsule (40 mg total) by mouth 2 (two) times daily.  Dispense: 60 capsule; Refill: 2 - mirtazapine (REMERON) 30 MG tablet; TAKE 2 TABLETS BY MOUTH AT BEDTIME  Dispense: 60 tablet; Refill: 2 - venlafaxine XR (EFFEXOR-XR) 75 MG 24 hr capsule; TAKE 3 CAPSULES BY MOUTH DAILY  Dispense: 90 capsule; Refill: 2  3. PTSD (post-traumatic stress disorder) - atomoxetine (STRATTERA) 40 MG capsule; Take 1 capsule (40 mg total) by mouth 2 (two) times daily.  Dispense: 60 capsule; Refill:  2 - cloNIDine (CATAPRES) 0.1 MG tablet; TAKE 1 TABLET BY MOUTH 3 TIMES DAILY  Dispense: 90 tablet; Refill: 2 - mirtazapine (REMERON) 30 MG tablet; TAKE 2 TABLETS BY MOUTH AT BEDTIME  Dispense: 60 tablet; Refill: 2 - venlafaxine XR (EFFEXOR-XR) 75 MG 24 hr capsule; TAKE 3 CAPSULES BY MOUTH DAILY  Dispense: 90 capsule; Refill: 2  4. Insomnia due to other mental disorder - cloNIDine (CATAPRES) 0.1 MG tablet; TAKE 1 TABLET BY MOUTH 3 TIMES DAILY  Dispense: 90 tablet; Refill: 2 - mirtazapine (REMERON) 30 MG tablet; TAKE 2 TABLETS BY MOUTH AT BEDTIME  Dispense: 60 tablet; Refill: 2  5. Alcohol use disorder, moderate, in early remission (Amber Craig)   Follow Up Instructions: In 2-3 months or sooner if needed   I discussed the assessment and treatment plan with the patient. The patient was provided an opportunity to ask questions and all were answered. The patient agreed with the plan and demonstrated an understanding of the instructions.   The patient was advised to call back or seek an in-person evaluation if the symptoms worsen or if the condition fails to improve as anticipated.  I provided 18 minutes of non-face-to-face time during this encounter.   [name], MD

## 2021-05-29 ENCOUNTER — Other Ambulatory Visit (HOSPITAL_COMMUNITY): Payer: Self-pay

## 2021-06-07 ENCOUNTER — Other Ambulatory Visit (HOSPITAL_COMMUNITY): Payer: Self-pay

## 2021-06-07 ENCOUNTER — Other Ambulatory Visit: Payer: Self-pay | Admitting: Sports Medicine

## 2021-06-07 MED FILL — Azelastine HCl Ophth Soln 0.05%: OPHTHALMIC | 30 days supply | Qty: 6 | Fill #2 | Status: AC

## 2021-06-08 ENCOUNTER — Other Ambulatory Visit (HOSPITAL_COMMUNITY): Payer: Self-pay

## 2021-06-09 ENCOUNTER — Other Ambulatory Visit (HOSPITAL_COMMUNITY): Payer: Self-pay

## 2021-06-11 MED ORDER — ODOR EATERS FOOT EX POWD
CUTANEOUS | 0 refills | Status: AC
  Filled 2021-06-11 – 2022-04-11 (×3): qty 180, fill #0

## 2021-06-12 ENCOUNTER — Other Ambulatory Visit (HOSPITAL_COMMUNITY): Payer: Self-pay

## 2021-06-21 ENCOUNTER — Other Ambulatory Visit (HOSPITAL_COMMUNITY): Payer: Self-pay

## 2021-06-30 ENCOUNTER — Other Ambulatory Visit (HOSPITAL_COMMUNITY): Payer: Self-pay

## 2021-07-10 ENCOUNTER — Other Ambulatory Visit (HOSPITAL_COMMUNITY): Payer: Self-pay

## 2021-07-11 ENCOUNTER — Other Ambulatory Visit (HOSPITAL_COMMUNITY): Payer: Self-pay

## 2021-07-25 ENCOUNTER — Other Ambulatory Visit (HOSPITAL_COMMUNITY): Payer: Self-pay

## 2021-08-04 ENCOUNTER — Other Ambulatory Visit (HOSPITAL_COMMUNITY): Payer: Self-pay

## 2021-08-04 MED ORDER — LEVOCETIRIZINE DIHYDROCHLORIDE 5 MG PO TABS
5.0000 mg | ORAL_TABLET | Freq: Two times a day (BID) | ORAL | 2 refills | Status: DC
  Filled 2021-08-04 – 2021-08-10 (×2): qty 60, 30d supply, fill #0
  Filled 2021-12-22: qty 60, 30d supply, fill #1
  Filled 2022-01-23: qty 60, 30d supply, fill #2

## 2021-08-10 ENCOUNTER — Other Ambulatory Visit (HOSPITAL_COMMUNITY): Payer: Self-pay

## 2021-08-11 ENCOUNTER — Other Ambulatory Visit (HOSPITAL_COMMUNITY): Payer: Self-pay

## 2021-08-17 ENCOUNTER — Other Ambulatory Visit: Payer: Self-pay

## 2021-08-17 ENCOUNTER — Other Ambulatory Visit (HOSPITAL_COMMUNITY): Payer: Self-pay

## 2021-08-17 ENCOUNTER — Telehealth (HOSPITAL_BASED_OUTPATIENT_CLINIC_OR_DEPARTMENT_OTHER): Payer: No Typology Code available for payment source | Admitting: Psychiatry

## 2021-08-17 DIAGNOSIS — F411 Generalized anxiety disorder: Secondary | ICD-10-CM | POA: Diagnosis not present

## 2021-08-17 DIAGNOSIS — F4310 Post-traumatic stress disorder, unspecified: Secondary | ICD-10-CM

## 2021-08-17 DIAGNOSIS — F332 Major depressive disorder, recurrent severe without psychotic features: Secondary | ICD-10-CM

## 2021-08-17 DIAGNOSIS — F5105 Insomnia due to other mental disorder: Secondary | ICD-10-CM

## 2021-08-17 DIAGNOSIS — F99 Mental disorder, not otherwise specified: Secondary | ICD-10-CM

## 2021-08-17 MED ORDER — ATOMOXETINE HCL 100 MG PO CAPS
100.0000 mg | ORAL_CAPSULE | Freq: Every day | ORAL | 2 refills | Status: DC
  Filled 2021-08-17: qty 30, 30d supply, fill #0

## 2021-08-17 MED ORDER — CLONIDINE HCL 0.1 MG PO TABS
ORAL_TABLET | Freq: Three times a day (TID) | ORAL | 2 refills | Status: DC
  Filled 2021-08-17: qty 90, fill #0

## 2021-08-17 MED ORDER — VENLAFAXINE HCL ER 75 MG PO CP24
ORAL_CAPSULE | Freq: Every day | ORAL | 2 refills | Status: DC
  Filled 2021-08-17: qty 90, 30d supply, fill #0

## 2021-08-17 MED ORDER — MIRTAZAPINE 30 MG PO TABS
ORAL_TABLET | Freq: Every day | ORAL | 2 refills | Status: DC
  Filled 2021-08-17: qty 60, fill #0

## 2021-08-17 NOTE — Progress Notes (Signed)
Virtual Visit via Video Note  I connected with Amber Craig on 08/17/21 at 10:30 AM EST by a video enabled telemedicine application and verified that I am speaking with the correct person using two identifiers.  Location: Patient: at work Provider: office   I discussed the limitations of evaluation and management by telemedicine and the availability of in person appointments. The patient expressed understanding and agreed to proceed.  History of Present Illness: Amber Craig shares that things are good. Work is better. She is able to walk away and avoid conflict. Things are good with at home, with family and friends. Her anxiety and sadness are high right now due to situational stressors. She states she would not be depressed or anxious if not for these problems. Her sleep, appetite and energy are good. She denies anhedonia and hopelessness. Amber Craig denies SI/HI. The PTSD is a little better in the way she manages the symptoms. The hypervigilance is ongoing and she not feeling safe or trust people. She is able to use some reality based testing to help manage. She denies any alcohol use since our last visit. She rarely takes Naltrexone on weekends if needed. She has not been taking Antabuse. Her marijuana use is decreased. She still uses daily after work but the amount is decreased. She is doing chores and is motivated to do things around the house. She has been cooking. She has noticed a small improvement in focus and productivity but still gets up multiple times and easily distracted.    Observations/Objective: Psychiatric Specialty Exam: ROS  There were no vitals taken for this visit.There is no height or weight on file to calculate BMI.  General Appearance: Fairly Groomed and Neat  Eye Contact:  Good  Speech:  Clear and Coherent and Normal Rate  Volume:  Normal  Mood:  Euthymic  Affect:  Full Range  Thought Process:  Goal Directed, Linear, and Descriptions of Associations: Intact  Orientation:   Full (Time, Place, and Person)  Thought Content:  Logical  Suicidal Thoughts:  No  Homicidal Thoughts:  No  Memory:  Immediate;   Good  Judgement:  Good  Insight:  Good  Psychomotor Activity:  Normal  Concentration:  Concentration: Good  Recall:  Good  Fund of Knowledge:  Good  Language:  NA  Akathisia:  No  Handed:  Right  AIMS (if indicated):     Assets:  Communication Skills Desire for Improvement Financial Resources/Insurance Housing Leisure Time [place] Talents/Skills Transportation Vocational/Educational  ADL's:  Intact  Cognition:  WNL  Sleep:        Assessment and Plan: Depression screen [facility] 2/9 08/17/2021 03/23/2021 02/02/2021 12/29/2020 11/01/2020  Decreased Interest 0 1 0 0 3  Down, Depressed, Hopeless 1 1 1 1 2   PHQ - 2 Score 1 2 1 1 5   Altered sleeping - 0 0 0 2  Tired, decreased energy - 1 0 0 2  Change in appetite - 0 0 3 0  Feeling bad or failure about yourself  - 1 1 1 3   Trouble concentrating - 3 0 0 3  Moving slowly or fidgety/restless - 0 0 0 3  Suicidal thoughts - 0 0 0 1  PHQ-9 Score - 7 2 5 19   Difficult doing work/chores - Somewhat difficult Somewhat difficult Somewhat difficult Somewhat difficult    Flowsheet Row Video Visit from 08/17/2021 in [place] [facility] Video Visit from 03/23/2021 in [place] [facility] Video Visit from 02/02/2021  in [place] [facility]  C-SSRS RISK CATEGORY No Risk Error: Q3, 4, or 5 should not be populated when Q2 is No Error: Q3, 4, or 5 should not be populated when Q2 is No      -Increase Strattera to 100 mg daily as patient is reporting improvement in focus and productivity  - No refills on Antabuse as patient is not taking it - No refills on naltrexone as patient is rarely taking it  1. PTSD (post-traumatic stress disorder) - atomoxetine (STRATTERA) 100 MG capsule; Take 1 capsule  (100 mg total) by mouth daily.  Dispense: 30 capsule; Refill: 2 - cloNIDine (CATAPRES) 0.1 MG tablet; TAKE 1 TABLET BY MOUTH 3 TIMES DAILY  Dispense: 90 tablet; Refill: 2 - venlafaxine XR (EFFEXOR-XR) 75 MG 24 hr capsule; TAKE 3 CAPSULES BY MOUTH DAILY  Dispense: 90 capsule; Refill: 2 - mirtazapine (REMERON) 30 MG tablet; TAKE 2 TABLETS BY MOUTH AT BEDTIME  Dispense: 60 tablet; Refill: 2  2. Severe episode of recurrent major depressive disorder, without psychotic features ([place]) - atomoxetine (STRATTERA) 100 MG capsule; Take 1 capsule (100 mg total) by mouth daily.  Dispense: 30 capsule; Refill: 2 - venlafaxine XR (EFFEXOR-XR) 75 MG 24 hr capsule; TAKE 3 CAPSULES BY MOUTH DAILY  Dispense: 90 capsule; Refill: 2 - mirtazapine (REMERON) 30 MG tablet; TAKE 2 TABLETS BY MOUTH AT BEDTIME  Dispense: 60 tablet; Refill: 2  3. GAD (generalized anxiety disorder) - cloNIDine (CATAPRES) 0.1 MG tablet; TAKE 1 TABLET BY MOUTH 3 TIMES DAILY  Dispense: 90 tablet; Refill: 2 - venlafaxine XR (EFFEXOR-XR) 75 MG 24 hr capsule; TAKE 3 CAPSULES BY MOUTH DAILY  Dispense: 90 capsule; Refill: 2 - mirtazapine (REMERON) 30 MG tablet; TAKE 2 TABLETS BY MOUTH AT BEDTIME  Dispense: 60 tablet; Refill: 2  4. Insomnia due to other mental disorder - cloNIDine (CATAPRES) 0.1 MG tablet; TAKE 1 TABLET BY MOUTH 3 TIMES DAILY  Dispense: 90 tablet; Refill: 2 - mirtazapine (REMERON) 30 MG tablet; TAKE 2 TABLETS BY MOUTH AT BEDTIME  Dispense: 60 tablet; Refill: 2    Follow Up Instructions: In 2-3 months or sooner if needed   I discussed the assessment and treatment plan with the patient. The patient was provided an opportunity to ask questions and all were answered. The patient agreed with the plan and demonstrated an understanding of the instructions.   The patient was advised to call back or seek an in-person evaluation if the symptoms worsen or if the condition fails to improve as anticipated.  I provided 19 minutes of  non-face-to-face time during this encounter.   [name], MD

## 2021-08-18 ENCOUNTER — Other Ambulatory Visit (HOSPITAL_COMMUNITY): Payer: Self-pay

## 2021-08-24 ENCOUNTER — Other Ambulatory Visit (HOSPITAL_COMMUNITY): Payer: Self-pay

## 2021-09-07 ENCOUNTER — Other Ambulatory Visit: Payer: Self-pay

## 2021-09-07 ENCOUNTER — Other Ambulatory Visit (HOSPITAL_COMMUNITY): Payer: Self-pay

## 2021-09-07 ENCOUNTER — Telehealth (HOSPITAL_BASED_OUTPATIENT_CLINIC_OR_DEPARTMENT_OTHER): Payer: No Typology Code available for payment source | Admitting: Psychiatry

## 2021-09-07 DIAGNOSIS — F5105 Insomnia due to other mental disorder: Secondary | ICD-10-CM | POA: Diagnosis not present

## 2021-09-07 DIAGNOSIS — F411 Generalized anxiety disorder: Secondary | ICD-10-CM

## 2021-09-07 DIAGNOSIS — F4310 Post-traumatic stress disorder, unspecified: Secondary | ICD-10-CM

## 2021-09-07 DIAGNOSIS — F332 Major depressive disorder, recurrent severe without psychotic features: Secondary | ICD-10-CM

## 2021-09-07 DIAGNOSIS — F99 Mental disorder, not otherwise specified: Secondary | ICD-10-CM

## 2021-09-07 MED ORDER — CLONIDINE HCL 0.1 MG PO TABS
ORAL_TABLET | Freq: Three times a day (TID) | ORAL | 0 refills | Status: DC
  Filled 2021-09-07: qty 270, 90d supply, fill #0

## 2021-09-07 MED ORDER — MIRTAZAPINE 30 MG PO TABS
ORAL_TABLET | Freq: Every day | ORAL | 0 refills | Status: DC
  Filled 2021-09-07: qty 180, 90d supply, fill #0

## 2021-09-07 MED ORDER — ATOMOXETINE HCL 100 MG PO CAPS
100.0000 mg | ORAL_CAPSULE | Freq: Every day | ORAL | 0 refills | Status: DC
  Filled 2021-09-07: qty 90, 90d supply, fill #0

## 2021-09-07 MED ORDER — VENLAFAXINE HCL ER 75 MG PO CP24
ORAL_CAPSULE | Freq: Every day | ORAL | 0 refills | Status: DC
Start: 2021-09-07 — End: 2021-11-02
  Filled 2021-09-07: qty 270, 90d supply, fill #0
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
## 2021-09-08 ENCOUNTER — Other Ambulatory Visit (HOSPITAL_COMMUNITY): Payer: Self-pay

## 2021-09-11 ENCOUNTER — Other Ambulatory Visit (HOSPITAL_COMMUNITY): Payer: Self-pay

## 2021-11-02 ENCOUNTER — Telehealth (HOSPITAL_BASED_OUTPATIENT_CLINIC_OR_DEPARTMENT_OTHER): Payer: No Typology Code available for payment source | Admitting: Psychiatry

## 2021-11-02 ENCOUNTER — Encounter (HOSPITAL_COMMUNITY): Payer: Self-pay | Admitting: Psychiatry

## 2021-11-02 ENCOUNTER — Other Ambulatory Visit (HOSPITAL_COMMUNITY): Payer: Self-pay

## 2021-11-02 ENCOUNTER — Other Ambulatory Visit: Payer: Self-pay

## 2021-11-02 DIAGNOSIS — F1290 Cannabis use, unspecified, uncomplicated: Secondary | ICD-10-CM

## 2021-11-02 DIAGNOSIS — F4310 Post-traumatic stress disorder, unspecified: Secondary | ICD-10-CM | POA: Diagnosis not present

## 2021-11-02 DIAGNOSIS — F332 Major depressive disorder, recurrent severe without psychotic features: Secondary | ICD-10-CM

## 2021-11-02 DIAGNOSIS — F17200 Nicotine dependence, unspecified, uncomplicated: Secondary | ICD-10-CM

## 2021-11-02 DIAGNOSIS — F411 Generalized anxiety disorder: Secondary | ICD-10-CM

## 2021-11-02 DIAGNOSIS — F5105 Insomnia due to other mental disorder: Secondary | ICD-10-CM

## 2021-11-02 DIAGNOSIS — F1020 Alcohol dependence, uncomplicated: Secondary | ICD-10-CM

## 2021-11-02 DIAGNOSIS — F99 Mental disorder, not otherwise specified: Secondary | ICD-10-CM

## 2021-11-02 MED ORDER — ATOMOXETINE HCL 100 MG PO CAPS
100.0000 mg | ORAL_CAPSULE | Freq: Every day | ORAL | 2 refills | Status: DC
  Filled 2021-11-02 – 2021-12-22 (×2): qty 30, 30d supply, fill #0
  Filled 2022-01-23: qty 30, 30d supply, fill #1
  Filled 2022-02-24: qty 30, 30d supply, fill #2

## 2021-11-02 MED ORDER — CLONIDINE HCL 0.1 MG PO TABS
ORAL_TABLET | Freq: Three times a day (TID) | ORAL | 2 refills | Status: DC
  Filled 2021-11-02: qty 90, fill #0
  Filled 2021-12-22: qty 30, 30d supply, fill #0
  Filled 2022-01-23: qty 30, 30d supply, fill #1
  Filled 2022-01-23: qty 90, 30d supply, fill #1
  Filled 2022-02-24: qty 90, 30d supply, fill #2
  Filled 2022-03-02: qty 60, 20d supply, fill #3

## 2021-11-02 MED ORDER — VENLAFAXINE HCL ER 75 MG PO CP24
ORAL_CAPSULE | Freq: Every day | ORAL | 2 refills | Status: DC
  Filled 2021-11-02: qty 90, fill #0
  Filled 2021-12-22: qty 90, 30d supply, fill #0
  Filled 2022-01-23: qty 90, 30d supply, fill #1
  Filled 2022-02-24: qty 90, 30d supply, fill #2

## 2021-11-02 MED ORDER — MIRTAZAPINE 30 MG PO TABS
ORAL_TABLET | Freq: Every day | ORAL | 2 refills | Status: DC
  Filled 2021-11-02: qty 60, fill #0
  Filled 2021-12-22: qty 60, 30d supply, fill #0
  Filled 2022-01-23: qty 60, 30d supply, fill #1
  Filled 2022-02-24: qty 60, 30d supply, fill #2

## 2021-11-02 NOTE — Progress Notes (Signed)
Virtual Visit via Video Note ? ?I connected with Amber Craig on 11/02/21 at  8:45 AM EST by a video enabled telemedicine application and verified that I am speaking with the correct person using two identifiers. ? ?Location: ?Patient: home ?Provider: office ?  ?I discussed the limitations of evaluation and management by telemedicine and the availability of in person appointments. The patient expressed understanding and agreed to proceed. ? ?History of Present Illness: ?"Things are going better". Amber Craig has been able to keep her health insurance. 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 Her sleep is improving and overall good. Her energy and appetite are good. Her depression is situational. It is stressful not having transportation. She has been relying on family and friends and taxi. Amber Craig has some court cases coming up and hopes it resolves soon. She is preparing to be able to drive again. For the last 2 months she feels depressed about 4-5 days/week. On those days she is down and irritable. She pushes thru it and distraction really helps. Amber Craig denies anhedonia but is endorsing worthlessness. She denies SI/HI. Her PTSD is unchanged but she is dealing with it. Her anxiety is ongoing and also unchanged. Amber Craig is drinking daily- 1/2 wine each session. It makes her tipsy but not drunk. [name] is not using Antabuse or Naltrexone. She plans to cut back soon. She continues to smoke marijuana and cigs daily. She is not ready to quit yet but will reach out when she is ready.  ?  ?Observations/Objective: ?Psychiatric Specialty Exam: ?ROS  ?There were no vitals taken for this visit.There is no height or weight on file to calculate BMI.  ?General Appearance: Casual and Neat  ?Eye Contact:  Good  ?Speech:  Clear and Coherent and Normal Rate  ?Volume:  Normal  ?Mood:  Irritable  ?Affect:  Full Range  ?Thought Process:  Goal Directed, Linear, and Descriptions of Associations: Intact   ?Orientation:  Full (Time, Place, and Person)  ?Thought Content:  Logical  ?Suicidal Thoughts:  No  ?Homicidal Thoughts:  No  ?Memory:  Immediate;   Good  ?Judgement:  Good  ?Insight:  Good  ?Psychomotor Activity:  Normal  ?Concentration:  Concentration: Good  ?Recall:  Good  ?Fund of Knowledge:  Good  ?Language:  Good  ?Akathisia:  No  ?Handed:  Right  ?AIMS (if indicated):     ?Assets:  Communication Skills ?Desire for Improvement ?Financial Resources/Insurance ?Housing ?Leisure Time ?Resilience ?Social Support ?Talents/Skills ?Transportation ?Vocational/Educational  ?ADL's:  Intact  ?Cognition:  WNL  ?Sleep:     ? ? ? ?Assessment and Plan: ?1. Severe episode of recurrent major depressive disorder, without psychotic features ([place]) ?- atomoxetine (STRATTERA) 100 MG capsule; Take 1 capsule (100 mg total) by mouth daily.  Dispense: 30 capsule; Refill: 2 ?- mirtazapine (REMERON) 30 MG tablet; TAKE 2 TABLETS BY MOUTH AT BEDTIME  Dispense: 60 tablet; Refill: 2 ?- venlafaxine XR (EFFEXOR-XR) 75 MG 24 hr capsule; TAKE 3 CAPSULES BY MOUTH DAILY  Dispense: 90 capsule; Refill: 2 ? ?2. PTSD (post-traumatic stress disorder) ?- atomoxetine (STRATTERA) 100 MG capsule; Take 1 capsule (100 mg total) by mouth daily.  Dispense: 30 capsule; Refill: 2 ?- cloNIDine (CATAPRES) 0.1 MG tablet; TAKE 1 TABLET BY MOUTH 3 TIMES DAILY  Dispense: 90 tablet; Refill: 2 ?- mirtazapine (REMERON) 30 MG tablet; TAKE 2 TABLETS BY MOUTH AT BEDTIME  Dispense: 60 tablet; Refill: 2 ?- venlafaxine XR (EFFEXOR-XR) 75 MG 24 hr capsule; TAKE 3  CAPSULES BY MOUTH DAILY  Dispense: 90 capsule; Refill: 2 ? ?3. GAD (generalized anxiety disorder) ?- cloNIDine (CATAPRES) 0.1 MG tablet; TAKE 1 TABLET BY MOUTH 3 TIMES DAILY  Dispense: 90 tablet; Refill: 2 ?- mirtazapine (REMERON) 30 MG tablet; TAKE 2 TABLETS BY MOUTH AT BEDTIME  Dispense: 60 tablet; Refill: 2 ?- venlafaxine XR (EFFEXOR-XR) 75 MG 24 hr capsule; TAKE 3 CAPSULES BY MOUTH DAILY  Dispense: 90 capsule;  Refill: 2 ? ?4. Insomnia due to other mental disorder ?- cloNIDine (CATAPRES) 0.1 MG tablet; TAKE 1 TABLET BY MOUTH 3 TIMES DAILY  Dispense: 90 tablet; Refill: 2 ?- mirtazapine (REMERON) 30 MG tablet; TAKE 2 TABLETS BY MOUTH AT BEDTIME  Dispense: 60 tablet; Refill: 2 ? ?5. Alcohol use disorder, moderate, dependence ([place]) ? ?6. Nicotine dependence, uncomplicated, unspecified nicotine product type ? ?7. Marijuana use, continuous ?- no refill on Antabuse or Naltrexone. She is not ready to cut back or quit alcohol use at this time. ? ? ? ?Depression screen [facility] 2/9 11/02/2021 08/17/2021 03/23/2021 02/02/2021 12/29/2020  ?Decreased Interest 0 0 1 0 0  ?Down, Depressed, Hopeless '2 1 1 1 1  '$ ?PHQ - 2 Score '2 1 2 1 1  '$ ?Altered sleeping 0 - 0 0 0  ?Tired, decreased energy 0 - 1 0 0  ?Change in appetite 0 - 0 0 3  ?Feeling bad or failure about yourself  2 - '1 1 1  '$ ?Trouble concentrating 0 - 3 0 0  ?Moving slowly or fidgety/restless 0 - 0 0 0  ?Suicidal thoughts 0 - 0 0 0  ?PHQ-9 Score 4 - '7 2 5  '$ ?Difficult doing work/chores Somewhat difficult - Somewhat difficult Somewhat difficult Somewhat difficult  ?Some recent data might be hidden  ? ? ?Flowsheet Row Video Visit from 11/02/2021 in [facility] Video Visit from 08/17/2021 in [facility] Video Visit from 03/23/2021 in [facility]  ?C-SSRS RISK CATEGORY No Risk No Risk Error: Q3, 4, or 5 should not be populated when Q2 is No  ? ?  ? ? ? ?Follow Up Instructions: ?In 2-3 months or sooner if needed ?  ?I discussed the assessment and treatment plan with the patient. The patient was provided an opportunity to ask questions and all were answered. The patient agreed with the plan and demonstrated an understanding of the instructions. ?  ?The patient was advised to call back or seek an in-person evaluation if the symptoms worsen or if the condition fails to improve as  anticipated. ? ?I provided 14 minutes of non-face-to-face time during this encounter. ? ? ?[name], MD ? ?

## 2021-11-09 ENCOUNTER — Telehealth (HOSPITAL_COMMUNITY): Payer: No Typology Code available for payment source | Admitting: Psychiatry

## 2021-12-14 ENCOUNTER — Telehealth (HOSPITAL_COMMUNITY): Payer: No Typology Code available for payment source | Admitting: Psychiatry

## 2021-12-22 ENCOUNTER — Other Ambulatory Visit (HOSPITAL_COMMUNITY): Payer: Self-pay

## 2022-01-23 ENCOUNTER — Other Ambulatory Visit (HOSPITAL_COMMUNITY): Payer: Self-pay

## 2022-01-25 ENCOUNTER — Telehealth (HOSPITAL_COMMUNITY): Payer: No Typology Code available for payment source | Admitting: Psychiatry

## 2022-01-25 ENCOUNTER — Telehealth (HOSPITAL_BASED_OUTPATIENT_CLINIC_OR_DEPARTMENT_OTHER): Payer: Self-pay | Admitting: Psychiatry

## 2022-01-25 ENCOUNTER — Other Ambulatory Visit (HOSPITAL_COMMUNITY): Payer: Self-pay

## 2022-01-25 NOTE — Telephone Encounter (Signed)
I called the patient at our scheduled appointment time. It went straight to voicemail twice. There was no answer or return phone call. I left a voice message for patient to call the clinic back at their convinence. Patient was not present on video platform used through [employer].

## 2022-02-08 ENCOUNTER — Telehealth (HOSPITAL_COMMUNITY): Payer: No Typology Code available for payment source | Admitting: Psychiatry

## 2022-02-08 ENCOUNTER — Telehealth (HOSPITAL_COMMUNITY): Payer: Self-pay | Admitting: Psychiatry

## 2022-02-08 NOTE — Telephone Encounter (Signed)
Patient was not present on video platform used through [facility]. I called the patient at our scheduled appointment time. There was no answer. I left a voice message for patient to call the clinic back at their convinence. There was no return phone call during out scheduled visit time. I was not able to speak with the patient today, as they were a no show for their scheduled appointment.   

## 2022-02-24 ENCOUNTER — Other Ambulatory Visit (HOSPITAL_COMMUNITY): Payer: Self-pay

## 2022-02-26 ENCOUNTER — Other Ambulatory Visit (HOSPITAL_COMMUNITY): Payer: Self-pay

## 2022-02-26 MED ORDER — IPRATROPIUM BROMIDE 0.03 % NA SOLN
NASAL | 5 refills | Status: AC
  Filled 2022-02-26: qty 30, 30d supply, fill #0
  Filled 2022-04-11: qty 30, 30d supply, fill #1

## 2022-02-28 ENCOUNTER — Other Ambulatory Visit (HOSPITAL_COMMUNITY): Payer: Self-pay

## 2022-03-02 ENCOUNTER — Other Ambulatory Visit (HOSPITAL_COMMUNITY): Payer: Self-pay

## 2022-03-02 ENCOUNTER — Other Ambulatory Visit (HOSPITAL_COMMUNITY): Payer: Self-pay | Admitting: Psychiatry

## 2022-03-02 DIAGNOSIS — F4310 Post-traumatic stress disorder, unspecified: Secondary | ICD-10-CM

## 2022-03-02 DIAGNOSIS — F5105 Insomnia due to other mental disorder: Secondary | ICD-10-CM

## 2022-03-02 DIAGNOSIS — F332 Major depressive disorder, recurrent severe without psychotic features: Secondary | ICD-10-CM

## 2022-03-02 DIAGNOSIS — F411 Generalized anxiety disorder: Secondary | ICD-10-CM

## 2022-03-05 ENCOUNTER — Other Ambulatory Visit (HOSPITAL_COMMUNITY): Payer: Self-pay

## 2022-03-06 ENCOUNTER — Other Ambulatory Visit (HOSPITAL_COMMUNITY): Payer: Self-pay

## 2022-03-15 ENCOUNTER — Telehealth (HOSPITAL_BASED_OUTPATIENT_CLINIC_OR_DEPARTMENT_OTHER): Payer: No Typology Code available for payment source | Admitting: Psychiatry

## 2022-03-15 ENCOUNTER — Other Ambulatory Visit (HOSPITAL_COMMUNITY): Payer: Self-pay

## 2022-03-15 DIAGNOSIS — F5105 Insomnia due to other mental disorder: Secondary | ICD-10-CM

## 2022-03-15 DIAGNOSIS — F1290 Cannabis use, unspecified, uncomplicated: Secondary | ICD-10-CM | POA: Diagnosis not present

## 2022-03-15 DIAGNOSIS — F332 Major depressive disorder, recurrent severe without psychotic features: Secondary | ICD-10-CM

## 2022-03-15 DIAGNOSIS — F1020 Alcohol dependence, uncomplicated: Secondary | ICD-10-CM

## 2022-03-15 DIAGNOSIS — F1021 Alcohol dependence, in remission: Secondary | ICD-10-CM | POA: Diagnosis not present

## 2022-03-15 DIAGNOSIS — F4310 Post-traumatic stress disorder, unspecified: Secondary | ICD-10-CM

## 2022-03-15 DIAGNOSIS — F17200 Nicotine dependence, unspecified, uncomplicated: Secondary | ICD-10-CM

## 2022-03-15 DIAGNOSIS — F99 Mental disorder, not otherwise specified: Secondary | ICD-10-CM

## 2022-03-15 DIAGNOSIS — F411 Generalized anxiety disorder: Secondary | ICD-10-CM

## 2022-03-15 MED ORDER — NALTREXONE HCL 50 MG PO TABS
ORAL_TABLET | Freq: Every day | ORAL | 1 refills | Status: DC
  Filled 2022-03-15 – 2022-04-11 (×2): qty 30, 30d supply, fill #0

## 2022-03-15 MED ORDER — CLONIDINE HCL 0.1 MG PO TABS
ORAL_TABLET | Freq: Three times a day (TID) | ORAL | 1 refills | Status: DC
  Filled 2022-03-15 – 2022-04-11 (×2): qty 90, 30d supply, fill #0

## 2022-03-15 MED ORDER — VENLAFAXINE HCL ER 75 MG PO CP24
ORAL_CAPSULE | Freq: Every day | ORAL | 1 refills | Status: DC
  Filled 2022-03-15: qty 90, fill #0
  Filled 2022-04-11: qty 90, 30d supply, fill #0

## 2022-03-15 MED ORDER — DISULFIRAM 250 MG PO TABS
250.0000 mg | ORAL_TABLET | Freq: Every day | ORAL | 1 refills | Status: DC
  Filled 2022-03-15: qty 30, 30d supply, fill #0
  Filled 2022-04-11: qty 30, 30d supply, fill #1

## 2022-03-15 MED ORDER — MIRTAZAPINE 30 MG PO TABS
ORAL_TABLET | Freq: Every day | ORAL | 1 refills | Status: DC
  Filled 2022-03-15: qty 60, fill #0
  Filled 2022-04-11: qty 60, 30d supply, fill #0

## 2022-03-15 MED ORDER — ATOMOXETINE HCL 100 MG PO CAPS
100.0000 mg | ORAL_CAPSULE | Freq: Every day | ORAL | 1 refills | Status: DC
  Filled 2022-03-15 – 2022-04-11 (×2): qty 30, 30d supply, fill #0

## 2022-03-15 NOTE — Progress Notes (Signed)
Virtual Visit via Video Note  I connected with Amber Craig on 03/15/22 at 10:45 AM EDT by   a video enabled telemedicine application and verified that I am speaking with the correct person using two identifiers.  Location: Patient: home Provider: office   I discussed the limitations of evaluation and management by telemedicine and the availability of in person appointments. The patient expressed understanding and agreed to proceed.  History of Present Illness: Amber Craig is having "the best 2 days in like a month". Amber Craig feels "happy and not like the world is falling apart". She is feeling more like herself. She was out of meds for 2 weeks and restarted them this past Monday.  Her therapist is out on maternity leave. Amber Craig is working 5 nights a week because she was almost evicted. Because of her work schedule she has not been able to go to [place]. She is staying focused but is drinking and using drugs again. She is drinking 1 bottle of wine/day and using marijuana most nights. Amber Craig is not taking Antabuse or Naltrexone but is thinking of starting it.  Her depression is "terrible". It declined swiftly after missing her Effexor. She was crying all the time, feeling sad and overwhelmed. She denies SI/HI. Amber Craig is really irritated and her "mood is all over the place". She denies anxiety. She is more hypervigilance is getting worse. She is having more dreams and nightmares. She is having intrusive memories but trying not to focus on them. She has her driver's license back but has to get tags and car insurance. Her sleep is fair. Her concentration is better with Strattera. Clonidine does help some with sleep.    Observations/Objective: Psychiatric Specialty Exam: ROS  There were no vitals taken for this visit.There is no height or weight on file to calculate BMI.  General Appearance: Casual and Neat  Eye Contact:  Good  Speech:  Clear and Coherent and Normal Rate  Volume:  Normal  Mood:  Anxious and  Depressed  Affect:  Congruent  Thought Process:  Goal Directed, Linear, and Descriptions of Associations: Intact  Orientation:  Full (Time, Place, and Person)  Thought Content:  Logical  Suicidal Thoughts:  No  Homicidal Thoughts:  No  Memory:  Immediate;   Good  Judgement:  Good  Insight:  Good  Psychomotor Activity:  Normal  Concentration:  Concentration: Good  Recall:  Good  Fund of Knowledge:  Good  Language:  Good  Akathisia:  No  Handed:  Right  AIMS (if indicated):     Assets:  Communication Skills Desire for Improvement Financial Resources/Insurance Housing Resilience Social Support Talents/Skills Transportation Vocational/Educational  ADL's:  Intact  Cognition:  WNL  Sleep:        Assessment and Plan:     03/15/2022   11:02 AM 11/02/2021    8:55 AM 08/17/2021   10:43 AM 03/23/2021    8:45 AM 02/02/2021    1:48 PM  Depression screen PHQ 2/9  Decreased Interest 3 0 0 1 0  Down, Depressed, Hopeless '3 2 1 1 1  '$ PHQ - 2 Score '6 2 1 2 1  '$ Altered sleeping 0 0  0 0  Tired, decreased energy 1 0  1 0  Change in appetite 0 0  0 0  Feeling bad or failure about yourself  '3 2  1 1  '$ Trouble concentrating 2 0  3 0  Moving slowly or fidgety/restless 0 0  0 0  Suicidal thoughts 0 0  0 0  PHQ-9 Score '12 4  7 2  '$ Difficult doing work/chores Very difficult Somewhat difficult  Somewhat difficult Somewhat difficult    Flowsheet Row Video Visit from 03/15/2022 in [facility] Video Visit from 11/02/2021 in [facility] Video Visit from 08/17/2021 in [facility]  C-SSRS RISK CATEGORY No Risk No Risk No Risk         Status of current problems: worsening depression, PTSD and alcohol and marijuana use  Meds: advised to take Clonidine prn for anxiety  Advised to start Naltrexone and Antabuse 1. Severe episode of recurrent major depressive disorder, without  psychotic features ([place]) - atomoxetine (STRATTERA) 100 MG capsule; Take 1 capsule (100 mg total) by mouth daily.  Dispense: 30 capsule; Refill: 1 - mirtazapine (REMERON) 30 MG tablet; TAKE 2 TABLETS BY MOUTH AT BEDTIME  Dispense: 60 tablet; Refill: 1 - venlafaxine XR (EFFEXOR-XR) 75 MG 24 hr capsule; TAKE 3 CAPSULES BY MOUTH DAILY  Dispense: 90 capsule; Refill: 1  2. Alcohol use disorder, moderate, in early remission ([facility]) - naltrexone (DEPADE) 50 MG tablet; TAKE 1 TABLET BY MOUTH ONCE A DAY  Dispense: 30 tablet; Refill: 1  3. Marijuana use, continuous  4. PTSD (post-traumatic stress disorder) - atomoxetine (STRATTERA) 100 MG capsule; Take 1 capsule (100 mg total) by mouth daily.  Dispense: 30 capsule; Refill: 1 - cloNIDine (CATAPRES) 0.1 MG tablet; TAKE 1 TABLET BY MOUTH 3 TIMES DAILY  Dispense: 90 tablet; Refill: 1 - mirtazapine (REMERON) 30 MG tablet; TAKE 2 TABLETS BY MOUTH AT BEDTIME  Dispense: 60 tablet; Refill: 1 - venlafaxine XR (EFFEXOR-XR) 75 MG 24 hr capsule; TAKE 3 CAPSULES BY MOUTH DAILY  Dispense: 90 capsule; Refill: 1  5. Nicotine dependence, uncomplicated, unspecified nicotine product type  6. GAD (generalized anxiety disorder) - cloNIDine (CATAPRES) 0.1 MG tablet; TAKE 1 TABLET BY MOUTH 3 TIMES DAILY  Dispense: 90 tablet; Refill: 1 - mirtazapine (REMERON) 30 MG tablet; TAKE 2 TABLETS BY MOUTH AT BEDTIME  Dispense: 60 tablet; Refill: 1 - venlafaxine XR (EFFEXOR-XR) 75 MG 24 hr capsule; TAKE 3 CAPSULES BY MOUTH DAILY  Dispense: 90 capsule; Refill: 1  7. Insomnia due to other mental disorder - cloNIDine (CATAPRES) 0.1 MG tablet; TAKE 1 TABLET BY MOUTH 3 TIMES DAILY  Dispense: 90 tablet; Refill: 1 - mirtazapine (REMERON) 30 MG tablet; TAKE 2 TABLETS BY MOUTH AT BEDTIME  Dispense: 60 tablet; Refill: 1  8. Alcohol use disorder, severe, dependence (HCC) - disulfiram (ANTABUSE) 250 MG tablet; Take 1 tablet (250 mg total) by mouth daily.  Dispense: 30 tablet; Refill:  1     Labs: none today    Therapy: brief supportive therapy provided. Discussed psychosocial stressors in detail.  Recommended pt stop all drug and alcohol use. She has a few people she is going to talk to to help keep her accountable for Antabuse use.    Collaboration of Care: Other none  Patient/Guardian was advised Release of Information must be obtained prior to any record release in order to collaborate their care with an outside provider. Patient/Guardian was advised if they have not already done so to contact the registration department to sign all necessary forms in order for [place] to release information regarding their care.   Consent: Patient/Guardian gives verbal consent for treatment and assignment of benefits for services provided during this visit. Patient/Guardian expressed understanding and agreed to proceed.       Follow Up Instructions: Follow up  in 1-2 months or sooner if needed    I discussed the assessment and treatment plan with the patient. The patient was provided an opportunity to ask questions and all were answered. The patient agreed with the plan and demonstrated an understanding of the instructions.   The patient was advised to call back or seek an in-person evaluation if the symptoms worsen or if the condition fails to improve as anticipated.  I provided 19 minutes of non-face-to-face time during this encounter.   [name], MD

## 2022-03-16 ENCOUNTER — Other Ambulatory Visit (HOSPITAL_COMMUNITY): Payer: Self-pay

## 2022-04-11 ENCOUNTER — Other Ambulatory Visit (HOSPITAL_COMMUNITY): Payer: Self-pay

## 2022-04-12 ENCOUNTER — Other Ambulatory Visit (HOSPITAL_COMMUNITY): Payer: Self-pay

## 2022-04-19 ENCOUNTER — Telehealth (HOSPITAL_BASED_OUTPATIENT_CLINIC_OR_DEPARTMENT_OTHER): Payer: No Typology Code available for payment source | Admitting: Psychiatry

## 2022-04-19 ENCOUNTER — Other Ambulatory Visit (HOSPITAL_COMMUNITY): Payer: Self-pay

## 2022-04-19 ENCOUNTER — Encounter (HOSPITAL_COMMUNITY): Payer: Self-pay | Admitting: Psychiatry

## 2022-04-19 DIAGNOSIS — F99 Mental disorder, not otherwise specified: Secondary | ICD-10-CM

## 2022-04-19 DIAGNOSIS — F4310 Post-traumatic stress disorder, unspecified: Secondary | ICD-10-CM | POA: Diagnosis not present

## 2022-04-19 DIAGNOSIS — F1020 Alcohol dependence, uncomplicated: Secondary | ICD-10-CM

## 2022-04-19 DIAGNOSIS — F17200 Nicotine dependence, unspecified, uncomplicated: Secondary | ICD-10-CM

## 2022-04-19 DIAGNOSIS — F5105 Insomnia due to other mental disorder: Secondary | ICD-10-CM

## 2022-04-19 DIAGNOSIS — F332 Major depressive disorder, recurrent severe without psychotic features: Secondary | ICD-10-CM

## 2022-04-19 DIAGNOSIS — F411 Generalized anxiety disorder: Secondary | ICD-10-CM | POA: Diagnosis not present

## 2022-04-19 DIAGNOSIS — F1290 Cannabis use, unspecified, uncomplicated: Secondary | ICD-10-CM

## 2022-04-19 MED ORDER — LEVOCETIRIZINE DIHYDROCHLORIDE 5 MG PO TABS
5.0000 mg | ORAL_TABLET | Freq: Two times a day (BID) | ORAL | 0 refills | Status: AC
  Filled 2022-04-19: qty 60, 30d supply, fill #0

## 2022-04-19 MED ORDER — VENLAFAXINE HCL ER 75 MG PO CP24
75.0000 mg | ORAL_CAPSULE | Freq: Every day | ORAL | 1 refills | Status: DC
  Filled 2022-04-19: qty 90, fill #0
  Filled 2022-05-14: qty 90, 90d supply, fill #0

## 2022-04-19 MED ORDER — ATOMOXETINE HCL 100 MG PO CAPS
100.0000 mg | ORAL_CAPSULE | Freq: Every day | ORAL | 1 refills | Status: DC
  Filled 2022-04-19 – 2022-05-14 (×2): qty 30, 30d supply, fill #0

## 2022-04-19 MED ORDER — CLONIDINE HCL 0.1 MG PO TABS
0.1000 mg | ORAL_TABLET | Freq: Three times a day (TID) | ORAL | 1 refills | Status: DC
  Filled 2022-04-19: qty 90, fill #0
  Filled 2022-05-14: qty 90, 30d supply, fill #0

## 2022-04-19 MED ORDER — DISULFIRAM 250 MG PO TABS
250.0000 mg | ORAL_TABLET | Freq: Every day | ORAL | 1 refills | Status: DC
  Filled 2022-04-19 – 2022-05-14 (×2): qty 30, 30d supply, fill #0

## 2022-04-19 MED ORDER — MIRTAZAPINE 30 MG PO TABS
30.0000 mg | ORAL_TABLET | Freq: Every day | ORAL | 1 refills | Status: DC
  Filled 2022-04-19: qty 60, fill #0
  Filled 2022-05-14: qty 60, 60d supply, fill #0

## 2022-04-19 MED ORDER — NALTREXONE HCL 50 MG PO TABS
50.0000 mg | ORAL_TABLET | Freq: Every day | ORAL | 1 refills | Status: DC
  Filled 2022-04-19: qty 30, fill #0
  Filled 2022-05-14: qty 30, 30d supply, fill #0
  Filled 2022-07-04: qty 30, 30d supply, fill #1

## 2022-04-19 NOTE — Progress Notes (Signed)
Virtual Visit via Video Note  I connected with Amber Craig on 04/19/22 at  8:45 AM EDT by   a video enabled telemedicine application and verified that I am speaking with the correct person using two identifiers.  Location: Patient: home Provider: office   I discussed the limitations of evaluation and management by telemedicine and the availability of in person appointments. The patient expressed understanding and agreed to proceed.  History of Present Illness: "Much better". Amber Craig has been taking Antabuse every other day. Her last drink was about 3 weeks ago. She started Naltrexone yesterday. She quit cigarettes and marijuana yesterday. The alcohol  cravings come when she is stressed. She supplements with water and coffee to prevent herself from drinking. Amber Craig is dealing with depression since she is no longer numb with alcohol. The level of depression is 3/10 (10 being the worst). This happens about 4 days/week where she feels sad, upset, crying and low motivation. She denies SI/HI. Her sleep and energy are good. Her appetite is variable. Her PTSD is unchanged - she has been having a lot of intrusive memories. She has rare nightmares but does have frequent disturbing dreams that she can't recall. The hypervigilance is a little better. She is trying not to give into it. Her anxiety is "ok". She takes Clonidine at bedtime. Her temper is still a struggle to deal with. It comes on suddenly. At work she wants to withdraw and isolate so that she won't get into trouble. Her focus is better with Amber Craig but she is still very restless.   Observations/Objective: Psychiatric Specialty Exam: ROS  There were no vitals taken for this visit.There is no height or weight on file to calculate BMI.  General Appearance: Neat and Well Groomed  Eye Contact:  Good  Speech:  Clear and Coherent and Normal Rate  Volume:  Normal  Mood:  Anxious and Depressed  Affect:  Full Range  Thought Process:  Goal Directed,  Linear, and Descriptions of Associations: Intact  Orientation:  Full (Time, Place, and Person)  Thought Content:  Logical  Suicidal Thoughts:  No  Homicidal Thoughts:  No  Memory:  Immediate;   Good  Judgement:  Good  Insight:  Good  Psychomotor Activity:  Normal  Concentration:  Concentration: Good  Recall:  Good  Fund of Knowledge:  Good  Language:  Good  Akathisia:  No  Handed:  Right  AIMS (if indicated):     Assets:  Communication Skills Desire for Improvement Financial Resources/Insurance Housing Leisure Time Social Support Talents/Skills Transportation Vocational/Educational  ADL's:  Intact  Cognition:  WNL  Sleep:        Assessment and Plan:     04/19/2022    8:56 AM 03/15/2022   11:02 AM 11/02/2021    8:55 AM 08/17/2021   10:43 AM 03/23/2021    8:45 AM  Depression screen PHQ 2/9  Decreased Interest 0 3 0 0 1  Down, Depressed, Hopeless '2 3 2 1 1  '$ PHQ - 2 Score '2 6 2 1 2  '$ Altered sleeping 0 0 0  0  Tired, decreased energy 0 1 0  1  Change in appetite 2 0 0  0  Feeling bad or failure about yourself  '2 3 2  1  '$ Trouble concentrating 3 2 0  3  Moving slowly or fidgety/restless 0 0 0  0  Suicidal thoughts 0 0 0  0  PHQ-9 Score '9 12 4  7  '$ Difficult doing work/chores Somewhat difficult  Very difficult Somewhat difficult  Somewhat difficult    Flowsheet Row Video Visit from 04/19/2022 in [facility] Video Visit from 03/15/2022 in [facility] Video Visit from 11/02/2021 in [facility] No Risk No Risk No Risk         Pt is aware that these meds carry a teratogenic risk. Pt will discuss plan of action if she does or plans to become pregnant in the future.  Status of current problems: slow improvement in mood and anxiety. She has quit alcohol 3 weeks ago. She quit cigarettes and marijuana today.   Meds:  1. Severe episode of  recurrent major depressive disorder, without psychotic features (HCC) - venlafaxine XR (EFFEXOR-XR) 75 MG 24 hr capsule; TAKE 3 CAPSULES BY MOUTH DAILY  Dispense: 90 capsule; Refill: 1 - mirtazapine (REMERON) 30 MG tablet; TAKE 2 TABLETS BY MOUTH AT BEDTIME  Dispense: 60 tablet; Refill: 1 - atomoxetine (STRATTERA) 100 MG capsule; Take 1 capsule (100 mg total) by mouth daily.  Dispense: 30 capsule; Refill: 1  2. PTSD (post-traumatic stress disorder) - venlafaxine XR (EFFEXOR-XR) 75 MG 24 hr capsule; TAKE 3 CAPSULES BY MOUTH DAILY  Dispense: 90 capsule; Refill: 1 - mirtazapine (REMERON) 30 MG tablet; TAKE 2 TABLETS BY MOUTH AT BEDTIME  Dispense: 60 tablet; Refill: 1 - atomoxetine (STRATTERA) 100 MG capsule; Take 1 capsule (100 mg total) by mouth daily.  Dispense: 30 capsule; Refill: 1 - cloNIDine (CATAPRES) 0.1 MG tablet; TAKE 1 TABLET BY MOUTH 3 TIMES DAILY  Dispense: 90 tablet; Refill: 1  3. GAD (generalized anxiety disorder) - venlafaxine XR (EFFEXOR-XR) 75 MG 24 hr capsule; TAKE 3 CAPSULES BY MOUTH DAILY  Dispense: 90 capsule; Refill: 1 - mirtazapine (REMERON) 30 MG tablet; TAKE 2 TABLETS BY MOUTH AT BEDTIME  Dispense: 60 tablet; Refill: 1 - cloNIDine (CATAPRES) 0.1 MG tablet; TAKE 1 TABLET BY MOUTH 3 TIMES DAILY  Dispense: 90 tablet; Refill: 1  4. Insomnia due to other mental disorder - mirtazapine (REMERON) 30 MG tablet; TAKE 2 TABLETS BY MOUTH AT BEDTIME  Dispense: 60 tablet; Refill: 1 - cloNIDine (CATAPRES) 0.1 MG tablet; TAKE 1 TABLET BY MOUTH 3 TIMES DAILY  Dispense: 90 tablet; Refill: 1  5. Alcohol use disorder, severe, dependence (HCC) - disulfiram (ANTABUSE) 250 MG tablet; Take 1 tablet (250 mg total) by mouth daily.  Dispense: 30 tablet; Refill: 1 - naltrexone (DEPADE) 50 MG tablet; TAKE 1 TABLET BY MOUTH ONCE A DAY  Dispense: 30 tablet; Refill: 1  6. Marijuana use, continuous  7. Nicotine dependence, uncomplicated, unspecified nicotine product type     Labs: none     Therapy: brief supportive therapy provided. Discussed psychosocial stressors in detail. Amber Craig is planning on starting NA or AA groups soon.  Amber Craig is going to be restarting therapy sometime in September 2023 (therapist is on maternity leave). Referred to 1-800- quitline for smoking cessation  Collaboration of Care: Other restarting therapy soon  Patient/Guardian was advised Release of Information must be obtained prior to any record release in order to collaborate their care with an outside provider. Patient/Guardian was advised if they have not already done so to contact the registration department to sign all necessary forms in order for [place] to release information regarding their care.   Consent: Patient/Guardian gives verbal consent for treatment and assignment of benefits for services provided during this visit. Patient/Guardian expressed understanding and agreed to proceed.  Follow Up Instructions: Follow up in 1-2 months or sooner if needed    I discussed the assessment and treatment plan with the patient. The patient was provided an opportunity to ask questions and all were answered. The patient agreed with the plan and demonstrated an understanding of the instructions.   The patient was advised to call back or seek an in-person evaluation if the symptoms worsen or if the condition fails to improve as anticipated.  I provided 17 minutes of non-face-to-face time during this encounter.   [name], MD

## 2022-05-14 ENCOUNTER — Other Ambulatory Visit (HOSPITAL_COMMUNITY): Payer: Self-pay

## 2022-05-15 ENCOUNTER — Other Ambulatory Visit (HOSPITAL_COMMUNITY): Payer: Self-pay

## 2022-05-16 ENCOUNTER — Other Ambulatory Visit (HOSPITAL_COMMUNITY): Payer: Self-pay

## 2022-05-17 ENCOUNTER — Encounter (HOSPITAL_COMMUNITY): Payer: Self-pay | Admitting: Psychiatry

## 2022-05-17 ENCOUNTER — Telehealth (HOSPITAL_BASED_OUTPATIENT_CLINIC_OR_DEPARTMENT_OTHER): Payer: No Typology Code available for payment source | Admitting: Psychiatry

## 2022-05-17 ENCOUNTER — Other Ambulatory Visit (HOSPITAL_COMMUNITY): Payer: Self-pay

## 2022-05-17 DIAGNOSIS — F4310 Post-traumatic stress disorder, unspecified: Secondary | ICD-10-CM | POA: Diagnosis not present

## 2022-05-17 DIAGNOSIS — F5105 Insomnia due to other mental disorder: Secondary | ICD-10-CM | POA: Diagnosis not present

## 2022-05-17 DIAGNOSIS — F411 Generalized anxiety disorder: Secondary | ICD-10-CM | POA: Diagnosis not present

## 2022-05-17 DIAGNOSIS — F332 Major depressive disorder, recurrent severe without psychotic features: Secondary | ICD-10-CM

## 2022-05-17 DIAGNOSIS — F1020 Alcohol dependence, uncomplicated: Secondary | ICD-10-CM

## 2022-05-17 DIAGNOSIS — F99 Mental disorder, not otherwise specified: Secondary | ICD-10-CM

## 2022-05-17 MED ORDER — DISULFIRAM 250 MG PO TABS
250.0000 mg | ORAL_TABLET | Freq: Every day | ORAL | 2 refills | Status: DC
  Filled 2022-05-17 – 2022-06-13 (×2): qty 30, 30d supply, fill #0

## 2022-05-17 MED ORDER — VENLAFAXINE HCL ER 75 MG PO CP24
75.0000 mg | ORAL_CAPSULE | Freq: Every day | ORAL | 2 refills | Status: DC
  Filled 2022-05-17 – 2022-07-25 (×5): qty 90, 90d supply, fill #0

## 2022-05-17 MED ORDER — MIRTAZAPINE 30 MG PO TABS
30.0000 mg | ORAL_TABLET | Freq: Every day | ORAL | 2 refills | Status: DC
  Filled 2022-05-17 – 2022-07-04 (×4): qty 60, 60d supply, fill #0
  Filled 2022-09-10: qty 60, 60d supply, fill #1

## 2022-05-17 MED ORDER — CLONIDINE HCL 0.1 MG PO TABS
0.1000 mg | ORAL_TABLET | Freq: Three times a day (TID) | ORAL | 2 refills | Status: DC
  Filled 2022-05-17 – 2022-06-13 (×2): qty 90, 30d supply, fill #0

## 2022-05-17 MED ORDER — ATOMOXETINE HCL 100 MG PO CAPS
100.0000 mg | ORAL_CAPSULE | Freq: Every day | ORAL | 2 refills | Status: DC
  Filled 2022-05-17 – 2022-06-13 (×2): qty 30, 30d supply, fill #0
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
## 2022-06-13 ENCOUNTER — Other Ambulatory Visit (HOSPITAL_COMMUNITY): Payer: Self-pay

## 2022-06-14 ENCOUNTER — Other Ambulatory Visit (HOSPITAL_COMMUNITY): Payer: Self-pay

## 2022-06-15 ENCOUNTER — Other Ambulatory Visit (HOSPITAL_COMMUNITY): Payer: Self-pay

## 2022-07-04 ENCOUNTER — Other Ambulatory Visit (HOSPITAL_COMMUNITY): Payer: Self-pay

## 2022-07-18 ENCOUNTER — Other Ambulatory Visit (HOSPITAL_COMMUNITY): Payer: Self-pay

## 2022-07-25 ENCOUNTER — Other Ambulatory Visit (HOSPITAL_COMMUNITY): Payer: Self-pay

## 2022-07-31 ENCOUNTER — Other Ambulatory Visit (HOSPITAL_COMMUNITY): Payer: Self-pay

## 2022-07-31 ENCOUNTER — Encounter (HOSPITAL_COMMUNITY): Payer: Self-pay

## 2022-08-01 ENCOUNTER — Other Ambulatory Visit (HOSPITAL_COMMUNITY): Payer: Self-pay

## 2022-08-03 ENCOUNTER — Other Ambulatory Visit (HOSPITAL_COMMUNITY): Payer: Self-pay

## 2022-08-09 ENCOUNTER — Encounter (HOSPITAL_COMMUNITY): Payer: Self-pay

## 2022-08-09 ENCOUNTER — Other Ambulatory Visit (HOSPITAL_COMMUNITY): Payer: Self-pay

## 2022-08-09 ENCOUNTER — Encounter (HOSPITAL_COMMUNITY): Payer: Self-pay | Admitting: Psychiatry

## 2022-08-09 ENCOUNTER — Telehealth (HOSPITAL_BASED_OUTPATIENT_CLINIC_OR_DEPARTMENT_OTHER): Payer: No Typology Code available for payment source | Admitting: Psychiatry

## 2022-08-09 ENCOUNTER — Other Ambulatory Visit: Payer: Self-pay

## 2022-08-09 DIAGNOSIS — F411 Generalized anxiety disorder: Secondary | ICD-10-CM | POA: Diagnosis not present

## 2022-08-09 DIAGNOSIS — F1020 Alcohol dependence, uncomplicated: Secondary | ICD-10-CM

## 2022-08-09 DIAGNOSIS — F1290 Cannabis use, unspecified, uncomplicated: Secondary | ICD-10-CM

## 2022-08-09 DIAGNOSIS — F99 Mental disorder, not otherwise specified: Secondary | ICD-10-CM

## 2022-08-09 DIAGNOSIS — F332 Major depressive disorder, recurrent severe without psychotic features: Secondary | ICD-10-CM

## 2022-08-09 DIAGNOSIS — F5105 Insomnia due to other mental disorder: Secondary | ICD-10-CM | POA: Diagnosis not present

## 2022-08-09 DIAGNOSIS — F4310 Post-traumatic stress disorder, unspecified: Secondary | ICD-10-CM

## 2022-08-09 DIAGNOSIS — F17210 Nicotine dependence, cigarettes, uncomplicated: Secondary | ICD-10-CM

## 2022-08-09 DIAGNOSIS — F17200 Nicotine dependence, unspecified, uncomplicated: Secondary | ICD-10-CM

## 2022-08-09 MED ORDER — CLONIDINE HCL 0.1 MG PO TABS
0.1000 mg | ORAL_TABLET | Freq: Three times a day (TID) | ORAL | 1 refills | Status: DC
  Filled 2022-08-09: qty 90, 30d supply, fill #0
  Filled 2022-09-10: qty 90, 30d supply, fill #1

## 2022-08-09 MED ORDER — VENLAFAXINE HCL ER 75 MG PO CP24
75.0000 mg | ORAL_CAPSULE | Freq: Every day | ORAL | 1 refills | Status: DC
  Filled 2022-08-09: qty 30, 30d supply, fill #0
  Filled 2022-09-10: qty 30, 30d supply, fill #1

## 2022-08-09 MED ORDER — ATOMOXETINE HCL 100 MG PO CAPS
100.0000 mg | ORAL_CAPSULE | Freq: Every day | ORAL | 1 refills | Status: DC
  Filled 2022-08-09: qty 30, 30d supply, fill #0
  Filled 2022-09-10: qty 30, 30d supply, fill #1

## 2022-08-09 NOTE — Progress Notes (Signed)
Virtual Visit via Video Note  I connected with Amber Craig on 08/09/22 at  9:00 AM EST by  a video enabled telemedicine application and verified that I am speaking with the correct person using two identifiers.  Location: Patient: home Provider: office   I discussed the limitations of evaluation and management by telemedicine and the availability of in person appointments. The patient expressed understanding and agreed to proceed.  History of Present Illness: "I have been doing ok". Amber Craig shares that she ran out of Effexor 2 months ago. She tried to get it filled again but was not successful. She thought she misplaced a script and didn't realize until last week. She has been experiencing a burning sensation all over her body constantly. It itches and is bothersome. Due to financial concerns she buys 2 bottles of wine and beer about 2x/month. She then drinks for a few days until it runs out. She will smoke cigarettes and smoke weed for a few days after each pay check as well. Overall she feels she is doing ok. Her sleep is not as good quality as it was before with Effexor and Remeron. She is impulsive with poor concentration and tearful. She denies deep depression. Her depression is situation and seems appropriate to the situation. Amber Craig is overwhelmed and very anxious especially at work. Outside of her home she has hypervigilance but at home it is stable. She has occasional intrusive memories. She is journaling and finds it effective. She denies SI/HI. She has been cleaning and organizing her home.    Observations/Objective: Psychiatric Specialty Exam: ROS  There were no vitals taken for this visit.There is no height or weight on file to calculate BMI.  General Appearance: Casual and Fairly Groomed  Eye Contact:  Good  Speech:  Clear and Coherent and Normal Rate  Volume:  Normal  Mood:  Anxious  Affect:  Full Range  Thought Process:  Goal Directed, Linear, and Descriptions of Associations:  Intact  Orientation:  Full (Time, Place, and Person)  Thought Content:  Logical  Suicidal Thoughts:  No  Homicidal Thoughts:  No  Memory:  Immediate;   Good  Judgement:  Good  Insight:  Good  Psychomotor Activity:  Normal  Concentration:  Concentration: Good  Recall:  Good  Fund of Knowledge:  Good  Language:  Good  Akathisia:  No  Handed:  Right  AIMS (if indicated):     Assets:  Communication Skills Desire for Improvement Financial Resources/Insurance Housing Leisure Time Resilience Social Support Talents/Skills Transportation Vocational/Educational  ADL's:  Intact  Cognition:  WNL  Sleep:        Assessment and Plan:     08/09/2022    9:15 AM 05/17/2022    9:11 AM 04/19/2022    8:56 AM 03/15/2022   11:02 AM 11/02/2021    8:55 AM  Depression screen PHQ 2/9  Decreased Interest 0 0 0 3 0  Down, Depressed, Hopeless 1 0 '2 3 2  '$ PHQ - 2 Score 1 0 '2 6 2  '$ Altered sleeping  0 0 0 0  Tired, decreased energy  3 0 1 0  Change in appetite  0 2 0 0  Feeling bad or failure about yourself   '1 2 3 2  '$ Trouble concentrating  '3 3 2 '$ 0  Moving slowly or fidgety/restless  0 0 0 0  Suicidal thoughts  0 0 0 0  PHQ-9 Score  '7 9 12 4  '$ Difficult doing work/chores  Not difficult at  all Somewhat difficult Very difficult Somewhat difficult    Flowsheet Row Video Visit from 08/09/2022 in [facility] Video Visit from 05/17/2022 in [facility] Video Visit from 04/19/2022 in [place] No Risk Error: Question 6 not populated No Risk         Pt is aware that these meds carry a teratogenic risk. Pt will discuss plan of action if she does or plans to become pregnant in the future.  Status of current problems: increased anxiety, situational depression. On going substance abuse  Meds: no refill on Antabuse or Naltrexone as she is not taking them  Restart  Effexor XR '75mg'$  po qD- she has been off of it for 2 months so I will start at '75mg'$  and see how her symptoms response and increase if needed 1. GAD (generalized anxiety disorder) - cloNIDine (CATAPRES) 0.1 MG tablet; Take 1 tablet (0.1 mg total) by mouth 3 (three) times daily.  Dispense: 90 tablet; Refill: 1 - venlafaxine XR (EFFEXOR-XR) 75 MG 24 hr capsule; Take 1 capsule (75 mg total) by mouth daily.  Dispense: 30 capsule; Refill: 1  2. PTSD (post-traumatic stress disorder) - atomoxetine (STRATTERA) 100 MG capsule; Take 1 capsule (100 mg total) by mouth daily.  Dispense: 30 capsule; Refill: 1 - cloNIDine (CATAPRES) 0.1 MG tablet; Take 1 tablet (0.1 mg total) by mouth 3 (three) times daily.  Dispense: 90 tablet; Refill: 1 - venlafaxine XR (EFFEXOR-XR) 75 MG 24 hr capsule; Take 1 capsule (75 mg total) by mouth daily.  Dispense: 30 capsule; Refill: 1  3. Severe episode of recurrent major depressive disorder, without psychotic features ([place]) - atomoxetine (STRATTERA) 100 MG capsule; Take 1 capsule (100 mg total) by mouth daily.  Dispense: 30 capsule; Refill: 1 - venlafaxine XR (EFFEXOR-XR) 75 MG 24 hr capsule; Take 1 capsule (75 mg total) by mouth daily.  Dispense: 30 capsule; Refill: 1  4. Insomnia due to other mental disorder - cloNIDine (CATAPRES) 0.1 MG tablet; Take 1 tablet (0.1 mg total) by mouth 3 (three) times daily.  Dispense: 90 tablet; Refill: 1  5. Alcohol use disorder, severe, dependence ([place])  6. Marijuana use, continuous  7. Nicotine dependence, uncomplicated, unspecified nicotine product type     Labs: none    Therapy: brief supportive therapy provided. Discussed psychosocial stressors in detail.    Recommended pt stop all drug and alcohol use   Collaboration of Care: Other none  Patient/Guardian was advised Release of Information must be obtained prior to any record release in order to collaborate their care with an outside provider. Patient/Guardian was advised if  they have not already done so to contact the registration department to sign all necessary forms in order for [place] to release information regarding their care.   Consent: Patient/Guardian gives verbal consent for treatment and assignment of benefits for services provided during this visit. Patient/Guardian expressed understanding and agreed to proceed.     Follow Up Instructions: Follow up in 1-2 months or sooner if needed    I discussed the assessment and treatment plan with the patient. The patient was provided an opportunity to ask questions and all were answered. The patient agreed with the plan and demonstrated an understanding of the instructions.   The patient was advised to call back or seek an in-person evaluation if the symptoms worsen or if the condition fails to improve as anticipated.  I provided 14 minutes of non-face-to-face time during  this encounter.   [name], MD

## 2022-08-10 ENCOUNTER — Other Ambulatory Visit (HOSPITAL_COMMUNITY): Payer: Self-pay

## 2022-08-10 ENCOUNTER — Other Ambulatory Visit: Payer: Self-pay

## 2022-09-10 ENCOUNTER — Other Ambulatory Visit (HOSPITAL_COMMUNITY): Payer: Self-pay

## 2022-10-04 ENCOUNTER — Telehealth (HOSPITAL_COMMUNITY): Payer: 59 | Admitting: Psychiatry

## 2022-10-10 ENCOUNTER — Other Ambulatory Visit (HOSPITAL_COMMUNITY): Payer: Self-pay

## 2022-10-10 ENCOUNTER — Other Ambulatory Visit (HOSPITAL_COMMUNITY): Payer: Self-pay | Admitting: Psychiatry

## 2022-10-10 DIAGNOSIS — F5105 Insomnia due to other mental disorder: Secondary | ICD-10-CM

## 2022-10-10 DIAGNOSIS — F4310 Post-traumatic stress disorder, unspecified: Secondary | ICD-10-CM

## 2022-10-10 DIAGNOSIS — F411 Generalized anxiety disorder: Secondary | ICD-10-CM

## 2022-10-10 DIAGNOSIS — F332 Major depressive disorder, recurrent severe without psychotic features: Secondary | ICD-10-CM

## 2022-10-11 ENCOUNTER — Other Ambulatory Visit (HOSPITAL_COMMUNITY): Payer: Self-pay

## 2022-10-16 ENCOUNTER — Other Ambulatory Visit (HOSPITAL_COMMUNITY): Payer: Self-pay

## 2022-10-17 ENCOUNTER — Other Ambulatory Visit (HOSPITAL_COMMUNITY): Payer: Self-pay | Admitting: *Deleted

## 2022-10-17 ENCOUNTER — Other Ambulatory Visit (HOSPITAL_COMMUNITY): Payer: Self-pay

## 2022-10-17 DIAGNOSIS — F332 Major depressive disorder, recurrent severe without psychotic features: Secondary | ICD-10-CM

## 2022-10-17 DIAGNOSIS — F411 Generalized anxiety disorder: Secondary | ICD-10-CM

## 2022-10-17 DIAGNOSIS — F99 Mental disorder, not otherwise specified: Secondary | ICD-10-CM

## 2022-10-17 DIAGNOSIS — F5105 Insomnia due to other mental disorder: Secondary | ICD-10-CM

## 2022-10-17 DIAGNOSIS — F4310 Post-traumatic stress disorder, unspecified: Secondary | ICD-10-CM

## 2022-10-17 MED ORDER — CLONIDINE HCL 0.1 MG PO TABS
0.1000 mg | ORAL_TABLET | Freq: Three times a day (TID) | ORAL | 0 refills | Status: DC
  Filled 2022-10-17: qty 66, 22d supply, fill #0

## 2022-10-17 MED ORDER — ATOMOXETINE HCL 100 MG PO CAPS
100.0000 mg | ORAL_CAPSULE | Freq: Every day | ORAL | 0 refills | Status: DC
  Filled 2022-10-17: qty 22, 22d supply, fill #0

## 2022-10-17 MED ORDER — VENLAFAXINE HCL ER 75 MG PO CP24
75.0000 mg | ORAL_CAPSULE | Freq: Every day | ORAL | 0 refills | Status: DC
  Filled 2022-10-17: qty 22, 22d supply, fill #0

## 2022-11-08 ENCOUNTER — Telehealth (HOSPITAL_BASED_OUTPATIENT_CLINIC_OR_DEPARTMENT_OTHER): Payer: 59 | Admitting: Psychiatry

## 2022-11-08 ENCOUNTER — Other Ambulatory Visit (HOSPITAL_COMMUNITY): Payer: Self-pay

## 2022-11-08 ENCOUNTER — Other Ambulatory Visit: Payer: Self-pay

## 2022-11-08 DIAGNOSIS — F332 Major depressive disorder, recurrent severe without psychotic features: Secondary | ICD-10-CM | POA: Diagnosis not present

## 2022-11-08 DIAGNOSIS — F5105 Insomnia due to other mental disorder: Secondary | ICD-10-CM

## 2022-11-08 DIAGNOSIS — F411 Generalized anxiety disorder: Secondary | ICD-10-CM

## 2022-11-08 DIAGNOSIS — F4310 Post-traumatic stress disorder, unspecified: Secondary | ICD-10-CM

## 2022-11-08 DIAGNOSIS — F1020 Alcohol dependence, uncomplicated: Secondary | ICD-10-CM

## 2022-11-08 MED ORDER — CLONIDINE HCL 0.1 MG PO TABS
0.1000 mg | ORAL_TABLET | Freq: Three times a day (TID) | ORAL | 0 refills | Status: DC
  Filled 2022-11-08: qty 270, 90d supply, fill #0

## 2022-11-08 MED ORDER — NALTREXONE HCL 50 MG PO TABS
50.0000 mg | ORAL_TABLET | Freq: Every day | ORAL | 0 refills | Status: DC
  Filled 2022-11-08: qty 90, 90d supply, fill #0

## 2022-11-08 MED ORDER — DISULFIRAM 250 MG PO TABS
250.0000 mg | ORAL_TABLET | Freq: Every day | ORAL | 2 refills | Status: DC
  Filled 2022-11-08: qty 30, 30d supply, fill #0
  Filled 2022-12-08: qty 30, 30d supply, fill #1

## 2022-11-08 MED ORDER — ATOMOXETINE HCL 100 MG PO CAPS
100.0000 mg | ORAL_CAPSULE | Freq: Every day | ORAL | 0 refills | Status: DC
  Filled 2022-11-08: qty 90, 90d supply, fill #0

## 2022-11-08 MED ORDER — VENLAFAXINE HCL ER 150 MG PO CP24
150.0000 mg | ORAL_CAPSULE | Freq: Every day | ORAL | 0 refills | Status: DC
  Filled 2022-11-08: qty 90, 90d supply, fill #0

## 2022-11-08 NOTE — Progress Notes (Signed)
Virtual Visit via Video Note  I connected with Amber Craig on 11/08/22 at  9:30 AM EDT by  a video enabled telemedicine application and verified that I am speaking with the correct person using two identifiers.  Location: Patient: home Provider: office   I discussed the limitations of evaluation and management by telemedicine and the availability of in person appointments. The patient expressed understanding and agreed to proceed.  History of Present Illness: Amber Craig shares she is "better. It has been rough some days but it is getting better". Amber Craig has been moody and impulsive and not like herself. She states things are going better now that she has been consistently taking her meds. She is not all the way better but is on the right track. Her sleep is 9-10 hrs but it is not always restful. Some nights she has racing thoughts and has trouble falling asleep and some nights she has broken sleep. Her energy is variable. She is vaping and drinking 1/2 bottle of wine or a beer every night.  She has been "forgetful and out of it". Drinking takes the edge off. She has been craving alcohol after work. She has not been taking Naltrexone or Antabuse. Work has been ok for the most part.  Since November she has been depressed daily but the intensity is not as bad now that she is taking Effexor. [name] is very unmotivated but is able to force herself do some things. She is endorsing some improvement in anhedonia. She cries a lot. Her appetite is variable but she can feel that she is losing weight. She is very hard on herself. She denies SI/HI. Her anxiety is very high and is it is daily struggle. In addition to racing thoughts she is also irritable, catastrophing and restless. Amber Craig feels overwhelmed all the time.  She is now driving again and that has helped with her appointments. She is thinking about finding a new therapist. Her PTSD is high. She is very hypervigilant. She denies nightmares and intrusive memories.   Clonidine helps to decrease her anxiety. She denies any falls.   Observations/Objective: Psychiatric Specialty Exam: ROS  There were no vitals taken for this visit.There is no height or weight on file to calculate BMI.  General Appearance: Casual and Fairly Groomed  Eye Contact:  Good  Speech:  Clear and Coherent and Normal Rate  Volume:  Normal  Mood:  Anxious and Depressed  Affect:  Full Range  Thought Process:  Goal Directed, Linear, and Descriptions of Associations: Intact  Orientation:  Full (Time, Place, and Person)  Thought Content:  Logical  Suicidal Thoughts:  No  Homicidal Thoughts:  No  Memory:  Immediate;   Good  Judgement:  Good  Insight:  Good  Psychomotor Activity:  Normal  Concentration:  Concentration: Good  Recall:  Good  Fund of Knowledge:  Good  Language:  Good  Akathisia:  No  Handed:  Right  AIMS (if indicated):     Assets:  Communication Skills Desire for Improvement Financial Resources/Insurance Housing Resilience Social Support Talents/Skills Transportation Vocational/Educational  ADL's:  Intact  Cognition:  WNL  Sleep:        Assessment and Plan:     11/08/2022    9:40 AM 08/09/2022    9:15 AM 05/17/2022    9:11 AM 04/19/2022    8:56 AM 03/15/2022   11:02 AM  Depression screen PHQ 2/9  Decreased Interest 2 0 0 0 3  Down, Depressed, Hopeless 3 1 0 2  3  PHQ - 2 Score 5 1 0 2 6  Altered sleeping 3  0 0 0  Tired, decreased energy 2  3 0 1  Change in appetite 2  0 2 0  Feeling bad or failure about yourself  '2  1 2 3  '$ Trouble concentrating '1  3 3 2  '$ Moving slowly or fidgety/restless 0  0 0 0  Suicidal thoughts 0  0 0 0  PHQ-9 Score '15  7 9 12  '$ Difficult doing work/chores Very difficult  Not difficult at all Somewhat difficult Very difficult    Flowsheet Row Video Visit from 11/08/2022 in [facility] Video Visit from 08/09/2022 in [facility] Video Visit  from 05/17/2022 in [facility] No Risk No Risk Error: Question 6 not populated          Pt is aware that these meds carry a teratogenic risk. Pt will discuss plan of action if she does or plans to become pregnant in the future.  Status of current problems: ongoing depression, anxiety and worsening use of alcohol   Medication management with supportive therapy. Risks and benefits, side effects and alternative treatment options discussed with patient. Pt was given an opportunity to ask questions about medication, illness, and treatment. All current psychiatric medications have been reviewed and discussed with the patient and adjusted as clinically appropriate.  Pt verbalized understanding and verbal consent obtained for treatment.  Meds: increase Effexor XR '150mg'$  po qD Restart Naltrexone Restart Antabuse- talked about having an accountable partner in her sister to improve compliance.   1. Severe episode of recurrent major depressive disorder, without psychotic features ([place]) - atomoxetine (STRATTERA) 100 MG capsule; Take 1 capsule (100 mg total) by mouth daily.  Dispense: 90 capsule; Refill: 0 - venlafaxine XR (EFFEXOR-XR) 150 MG 24 hr capsule; Take 1 capsule (150 mg total) by mouth daily.  Dispense: 90 capsule; Refill: 0  2. GAD (generalized anxiety disorder) - cloNIDine (CATAPRES) 0.1 MG tablet; Take 1 tablet (0.1 mg total) by mouth 3 (three) times daily.  Dispense: 270 tablet; Refill: 0 - venlafaxine XR (EFFEXOR-XR) 150 MG 24 hr capsule; Take 1 capsule (150 mg total) by mouth daily.  Dispense: 90 capsule; Refill: 0  3. Insomnia due to other mental disorder - cloNIDine (CATAPRES) 0.1 MG tablet; Take 1 tablet (0.1 mg total) by mouth 3 (three) times daily.  Dispense: 270 tablet; Refill: 0  4. Alcohol use disorder, severe, dependence ([facility]) - disulfiram (ANTABUSE) 250 MG tablet; Take 1 tablet (250 mg total) by mouth daily.   Dispense: 30 tablet; Refill: 2 - naltrexone (DEPADE) 50 MG tablet; Take 1 tablet (50 mg total) by mouth daily.  Dispense: 90 tablet; Refill: 0  5. PTSD (post-traumatic stress disorder) - atomoxetine (STRATTERA) 100 MG capsule; Take 1 capsule (100 mg total) by mouth daily.  Dispense: 90 capsule; Refill: 0 - cloNIDine (CATAPRES) 0.1 MG tablet; Take 1 tablet (0.1 mg total) by mouth 3 (three) times daily.  Dispense: 270 tablet; Refill: 0 - venlafaxine XR (EFFEXOR-XR) 150 MG 24 hr capsule; Take 1 capsule (150 mg total) by mouth daily.  Dispense: 90 capsule; Refill: 0     Therapy: brief supportive therapy provided. Discussed psychosocial stressors in detail.     Recommended pt stop all drug and alcohol use   Collaboration of Care: Other encouraged to restart therapy  Patient/Guardian was advised Release of Information must be obtained prior to any  record release in order to collaborate their care with an outside provider. Patient/Guardian was advised if they have not already done so to contact the registration department to sign all necessary forms in order for [place] to release information regarding their care.   Consent: Patient/Guardian gives verbal consent for treatment and assignment of benefits for services provided during this visit. Patient/Guardian expressed understanding and agreed to proceed.      Follow Up Instructions: Follow up in 1-2 months or sooner if needed    I discussed the assessment and treatment plan with the patient. The patient was provided an opportunity to ask questions and all were answered. The patient agreed with the plan and demonstrated an understanding of the instructions.   The patient was advised to call back or seek an in-person evaluation if the symptoms worsen or if the condition fails to improve as anticipated.  I provided 19 minutes of non-face-to-face time during this encounter.   [name], MD

## 2022-11-09 ENCOUNTER — Other Ambulatory Visit (HOSPITAL_COMMUNITY): Payer: Self-pay

## 2022-11-09 ENCOUNTER — Encounter (HOSPITAL_COMMUNITY): Payer: Self-pay

## 2022-11-10 ENCOUNTER — Other Ambulatory Visit (HOSPITAL_COMMUNITY): Payer: Self-pay

## 2022-11-12 ENCOUNTER — Other Ambulatory Visit: Payer: Self-pay

## 2022-11-14 ENCOUNTER — Telehealth (HOSPITAL_COMMUNITY): Payer: Self-pay | Admitting: *Deleted

## 2022-11-14 NOTE — Telephone Encounter (Signed)
Patient called & stated that she just finished office visit with provider & was double checking her med list to see that refills have been sent & noticed that Mirtazapine has been removed from med List. Unable to find mention of medication in notes  & removal  reason. call back needed to help explain reason why??

## 2022-11-15 ENCOUNTER — Other Ambulatory Visit (HOSPITAL_COMMUNITY): Payer: Self-pay | Admitting: Psychiatry

## 2022-11-15 NOTE — Telephone Encounter (Signed)
LVM PER PROVIDER:  We didn't restart it in December 2023. She had been off all her meds for a while and we opted not to restart Remeron at that time.

## 2022-11-22 ENCOUNTER — Other Ambulatory Visit: Payer: Self-pay

## 2022-11-22 ENCOUNTER — Telehealth (HOSPITAL_BASED_OUTPATIENT_CLINIC_OR_DEPARTMENT_OTHER): Payer: 59 | Admitting: Psychiatry

## 2022-11-22 DIAGNOSIS — F99 Mental disorder, not otherwise specified: Secondary | ICD-10-CM

## 2022-11-22 DIAGNOSIS — F5105 Insomnia due to other mental disorder: Secondary | ICD-10-CM

## 2022-11-22 DIAGNOSIS — F332 Major depressive disorder, recurrent severe without psychotic features: Secondary | ICD-10-CM | POA: Diagnosis not present

## 2022-11-22 DIAGNOSIS — F411 Generalized anxiety disorder: Secondary | ICD-10-CM

## 2022-11-22 MED ORDER — MIRTAZAPINE 30 MG PO TABS
30.0000 mg | ORAL_TABLET | Freq: Every day | ORAL | 0 refills | Status: DC
  Filled 2022-11-22: qty 90, 90d supply, fill #0

## 2022-11-22 NOTE — Progress Notes (Signed)
Virtual Visit via Video Note  I connected with Amber Craig on 11/22/22 at 11:30 AM EDT by a video enabled telemedicine application and verified that I am speaking with the correct person using two identifiers.  Location: Patient: home Provider: office   I discussed the limitations of evaluation and management by telemedicine and the availability of in person appointments. The patient expressed understanding and agreed to proceed.  History of Present Illness: We last met 2 weeks ago and that time we increased Effexor and restarted Naltrexone and Antabuse. Amber Craig rescheduled early because she wants to restart Remeron. It helps with her sleep and mood. She has been taking 30mg  for the last several months and wants to keep taking it. Her depression is situational. She denies SI/HI. She is taking Antabuse and Naltrexone daily. Her sister is helping with keep her in check and she has not drank in the last 2 weeks.     Observations/Objective: Psychiatric Specialty Exam: ROS  There were no vitals taken for this visit.There is no height or weight on file to calculate BMI.  General Appearance: Fairly Groomed and Neat  Eye Contact:  Good  Speech:  Clear and Coherent and Normal Rate  Volume:  Normal  Mood:  Depressed  Affect:  Full Range  Thought Process:  Goal Directed, Linear, and Descriptions of Associations: Intact  Orientation:  Full (Time, Place, and Person)  Thought Content:  Logical  Suicidal Thoughts:  No  Homicidal Thoughts:  No  Memory:  Immediate;   Good  Judgement:  Good  Insight:  Good  Psychomotor Activity:  Normal  Concentration:  Concentration: Good  Recall:  Good  Fund of Knowledge:  Good  Language:  Good  Akathisia:  No  Handed:  Right  AIMS (if indicated):     Assets:  Communication Skills Desire for Improvement Financial Resources/Insurance Housing Resilience Social Support Talents/Skills Transportation Vocational/Educational  ADL's:  Intact  Cognition:   WNL  Sleep:        Assessment and Plan:     11/08/2022    9:40 AM 08/09/2022    9:15 AM 05/17/2022    9:11 AM 04/19/2022    8:56 AM 03/15/2022   11:02 AM  Depression screen PHQ 2/9  Decreased Interest 2 0 0 0 3  Down, Depressed, Hopeless 3 1 0 2 3  PHQ - 2 Score 5 1 0 2 6  Altered sleeping 3  0 0 0  Tired, decreased energy 2  3 0 1  Change in appetite 2  0 2 0  Feeling bad or failure about yourself  2  1 2 3   Trouble concentrating 1  3 3 2   Moving slowly or fidgety/restless 0  0 0 0  Suicidal thoughts 0  0 0 0  PHQ-9 Score 15  7 9 12   Difficult doing work/chores Very difficult  Not difficult at all Somewhat difficult Very difficult    Flowsheet Row Video Visit from 11/08/2022 in [facility] Video Visit from 08/09/2022 in [facility] Video Visit from 05/17/2022 in [facility]  C-SSRS RISK CATEGORY No Risk No Risk Error: Question 6 not populated          Pt is aware that these meds carry a teratogenic risk. Pt will discuss plan of action if she does or plans to become pregnant in the future.  Status of current problems: stable   Medication management with supportive therapy. Risks and benefits, side  effects and alternative treatment options discussed with patient. Pt was given an opportunity to ask questions about medication, illness, and treatment. All current psychiatric medications have been reviewed and discussed with the patient and adjusted as clinically appropriate.  Pt verbalized understanding and verbal consent obtained for treatment.  Meds: continue Strattera 100mg  qD, Effexor XR 150mg  qD, Clonidine 0.1mg  TID, Antabuse 250mg  qD, Naltrexone 50mg  qD  Restart Remeron 30mg  po qD 1. GAD (generalized anxiety disorder) - mirtazapine (REMERON) 30 MG tablet; Take 1 tablet (30 mg total) by mouth at bedtime.  Dispense: 90 tablet; Refill: 0  2. Severe episode  of recurrent major depressive disorder, without psychotic features ([place]) - mirtazapine (REMERON) 30 MG tablet; Take 1 tablet (30 mg total) by mouth at bedtime.  Dispense: 90 tablet; Refill: 0  3. Insomnia due to other mental disorder - mirtazapine (REMERON) 30 MG tablet; Take 1 tablet (30 mg total) by mouth at bedtime.  Dispense: 90 tablet; Refill: 0     Labs: none    Therapy: brief supportive therapy provided.     Collaboration of Care: Other none  Patient/Guardian was advised Release of Information must be obtained prior to any record release in order to collaborate their care with an outside provider. Patient/Guardian was advised if they have not already done so to contact the registration department to sign all necessary forms in order for [place] to release information regarding their care.   Consent: Patient/Guardian gives verbal consent for treatment and assignment of benefits for services provided during this visit. Patient/Guardian expressed understanding and agreed to proceed.      Follow Up Instructions: Follow up in 2-3 months or sooner if needed    I discussed the assessment and treatment plan with the patient. The patient was provided an opportunity to ask questions and all were answered. The patient agreed with the plan and demonstrated an understanding of the instructions.   The patient was advised to call back or seek an in-person evaluation if the symptoms worsen or if the condition fails to improve as anticipated.  I provided 5 minutes of non-face-to-face time during this encounter.   [name], MD

## 2022-12-08 ENCOUNTER — Other Ambulatory Visit (HOSPITAL_COMMUNITY): Payer: Self-pay | Admitting: Psychiatry

## 2022-12-08 DIAGNOSIS — F1020 Alcohol dependence, uncomplicated: Secondary | ICD-10-CM

## 2022-12-10 ENCOUNTER — Other Ambulatory Visit (HOSPITAL_COMMUNITY): Payer: Self-pay

## 2022-12-10 ENCOUNTER — Other Ambulatory Visit: Payer: Self-pay

## 2022-12-13 ENCOUNTER — Encounter (HOSPITAL_COMMUNITY): Payer: Self-pay

## 2022-12-13 ENCOUNTER — Other Ambulatory Visit (HOSPITAL_COMMUNITY): Payer: Self-pay

## 2022-12-13 ENCOUNTER — Telehealth (HOSPITAL_BASED_OUTPATIENT_CLINIC_OR_DEPARTMENT_OTHER): Payer: 59 | Admitting: Psychiatry

## 2022-12-13 DIAGNOSIS — F1021 Alcohol dependence, in remission: Secondary | ICD-10-CM

## 2022-12-13 DIAGNOSIS — F411 Generalized anxiety disorder: Secondary | ICD-10-CM | POA: Diagnosis not present

## 2022-12-13 DIAGNOSIS — F5105 Insomnia due to other mental disorder: Secondary | ICD-10-CM

## 2022-12-13 DIAGNOSIS — F99 Mental disorder, not otherwise specified: Secondary | ICD-10-CM

## 2022-12-13 DIAGNOSIS — F332 Major depressive disorder, recurrent severe without psychotic features: Secondary | ICD-10-CM

## 2022-12-13 MED ORDER — MIRTAZAPINE 45 MG PO TABS
45.0000 mg | ORAL_TABLET | Freq: Every day | ORAL | 0 refills | Status: DC
  Filled 2022-12-13 – 2023-01-15 (×2): qty 90, 90d supply, fill #0

## 2022-12-13 NOTE — Progress Notes (Signed)
Virtual Visit via Video Note  I connected with Estelle Grumbles on 12/13/22 at  8:30 AM EDT by a video enabled telemedicine application and verified that I am speaking with the correct person using two identifiers.  Location: Patient: home Provider: office   I discussed the limitations of evaluation and management by telemedicine and the availability of in person appointments. The patient expressed understanding and agreed to proceed.  History of Present Illness: Ouita share she restarted Remeron 4 weeks ago after our last meeting. She takes Remeron at her bedtime and falls asleep quickly. The first week she was sleeping without breaks. This week she is waking up in the middle of her sleep and it can take hours to fall asleep. Tessi does not feel rested when wakes up. She denies any side effects from Remeron. Her depression "is really good". She has noticed improved energy and motivation. She is still tearful but it is not bad. She is more alert and she is doing well at work. She is hungry all the time but her she is noting eating constantly and her weight is going down. She is not as hard on herself and denies SI/HI. Laquilla denies alcohol use and is taking Antabuse. It is getting easier as the days progress. Her anxiety is a little better but when she gets anxious it is very high. She is able to control her response more when she is anxious.    Observations/Objective: Psychiatric Specialty Exam: ROS  There were no vitals taken for this visit.There is no height or weight on file to calculate BMI.  General Appearance: Casual and Neat  Eye Contact:  Good  Speech:  Clear and Coherent and Normal Rate  Volume:  Normal  Mood:  Euthymic  Affect:  Full Range  Thought Process:  Goal Directed, Linear, and Descriptions of Associations: Intact  Orientation:  Full (Time, Place, and Person)  Thought Content:  Logical  Suicidal Thoughts:  No  Homicidal Thoughts:  No  Memory:  Immediate;   Good  Judgement:   Good  Insight:  Good  Psychomotor Activity:  Normal  Concentration:  Concentration: Good  Recall:  Good  Fund of Knowledge:  Good  Language:  Good  Akathisia:  No  Handed:  Right  AIMS (if indicated):     Assets:  Communication Skills Desire for Improvement Financial Resources/Insurance Housing Resilience Social Support Talents/Skills Transportation Vocational/Educational  ADL's:  Intact  Cognition:  WNL  Sleep:        Assessment and Plan:     12/13/2022    8:34 AM 11/08/2022    9:40 AM 08/09/2022    9:15 AM 05/17/2022    9:11 AM 04/19/2022    8:56 AM  Depression screen PHQ 2/9  Decreased Interest 0 2 0 0 0  Down, Depressed, Hopeless 0 2  PHQ - 2 Score 0 2  Altered sleeping 1 3  0 0  Tired, decreased energy 0  Change in appetite 2 2  0 2  Feeling bad or failure about yourself  Trouble concentrating Moving slowly or fidgety/restless 0 0  0 0  Suicidal thoughts 0 0  0 0  PHQ-9 Score Difficult doing work/chores Not difficult at all Very difficult  Not difficult at all Somewhat difficult    Flowsheet Row [facility] Video Visit from 11/08/2022 in [facility] Video Visit from 08/09/2022 in [facility]  C-SSRS RISK CATEGORY No Risk No Risk No Risk          Pt is aware that these meds carry a teratogenic risk. Pt will discuss plan of action if she does or plans to become pregnant in the future.  Status of current problems: insomnia is ongoing, improved depression and anxiety symptoms. No alcohol use in several weeks since complying with Antabuse.    Medication management with supportive therapy. Risks and benefits, side effects and alternative treatment options discussed with patient. Pt was given an opportunity to ask questions about medication, illness, and treatment.  All current psychiatric medications have been reviewed and discussed with the patient and adjusted as clinically appropriate.  Pt verbalized understanding and verbal consent obtained for treatment.  Meds: increase Remeron  po qHS for insomnia 1. Insomnia due to other mental disorder - mirtazapine (REMERON) 45 MG tablet; Take 1 tablet (45 mg total) by mouth at bedtime.  Dispense: 90 tablet; Refill: 0  2. Severe episode of recurrent major depressive disorder, without psychotic features - mirtazapine (REMERON) 45 MG tablet; Take 1 tablet (45 mg total) by mouth at bedtime.  Dispense: 90 tablet; Refill: 0  3. GAD (generalized anxiety disorder) - mirtazapine (REMERON) 45 MG tablet; Take 1 tablet (45 mg total) by mouth at bedtime.  Dispense: 90 tablet; Refill: 0  4. Alcohol use disorder, moderate, in early remission     Labs: none    Therapy: brief supportive therapy provided. Discussed psychosocial stressors in detail.     Reviewed sleep hygiene in detail   Collaboration of Care: Other none  Patient/Guardian was advised Release of Information must be obtained prior to any record release in order to collaborate their care with an outside provider. Patient/Guardian was advised if they have not already done so to contact the registration department to sign all necessary forms in order for [place] to release information regarding their care.   Consent: Patient/Guardian gives verbal consent for treatment and assignment of benefits for services provided during this visit. Patient/Guardian expressed understanding and agreed to proceed.      Follow Up Instructions: Follow up in 2 months or sooner if needed    I discussed the assessment and treatment plan with the patient. The patient was provided an opportunity to ask questions and all were answered. The patient agreed with the plan and demonstrated an understanding of the instructions.   The patient was advised to call back or seek an  in-person evaluation if the symptoms worsen or if the condition fails to improve as anticipated.  I provided 12 minutes of non-face-to-face time during this encounter.   Oletta Darter, MD

## 2022-12-31 ENCOUNTER — Encounter (HOSPITAL_COMMUNITY): Payer: Self-pay

## 2023-01-15 ENCOUNTER — Other Ambulatory Visit: Payer: Self-pay

## 2023-01-15 ENCOUNTER — Other Ambulatory Visit (HOSPITAL_COMMUNITY): Payer: Self-pay

## 2023-01-24 ENCOUNTER — Other Ambulatory Visit (HOSPITAL_COMMUNITY): Payer: Self-pay

## 2023-01-31 ENCOUNTER — Other Ambulatory Visit (HOSPITAL_COMMUNITY): Payer: Self-pay

## 2023-01-31 ENCOUNTER — Other Ambulatory Visit: Payer: Self-pay

## 2023-01-31 ENCOUNTER — Telehealth (HOSPITAL_BASED_OUTPATIENT_CLINIC_OR_DEPARTMENT_OTHER): Payer: 59 | Admitting: Psychiatry

## 2023-01-31 DIAGNOSIS — F332 Major depressive disorder, recurrent severe without psychotic features: Secondary | ICD-10-CM | POA: Diagnosis not present

## 2023-01-31 DIAGNOSIS — F411 Generalized anxiety disorder: Secondary | ICD-10-CM | POA: Diagnosis not present

## 2023-01-31 DIAGNOSIS — F1020 Alcohol dependence, uncomplicated: Secondary | ICD-10-CM | POA: Diagnosis not present

## 2023-01-31 DIAGNOSIS — F5105 Insomnia due to other mental disorder: Secondary | ICD-10-CM | POA: Diagnosis not present

## 2023-01-31 DIAGNOSIS — F4310 Post-traumatic stress disorder, unspecified: Secondary | ICD-10-CM

## 2023-01-31 MED ORDER — VENLAFAXINE HCL ER 150 MG PO CP24
150.0000 mg | ORAL_CAPSULE | Freq: Every day | ORAL | 0 refills | Status: DC
  Filled 2023-01-31: qty 90, 90d supply, fill #0

## 2023-01-31 MED ORDER — NALTREXONE HCL 50 MG PO TABS
50.0000 mg | ORAL_TABLET | Freq: Every day | ORAL | 0 refills | Status: AC
  Filled 2023-01-31: qty 90, 90d supply, fill #0

## 2023-01-31 MED ORDER — MIRTAZAPINE 30 MG PO TABS
60.0000 mg | ORAL_TABLET | Freq: Every day | ORAL | 0 refills | Status: DC
  Filled 2023-01-31: qty 180, 90d supply, fill #0

## 2023-01-31 MED ORDER — ATOMOXETINE HCL 100 MG PO CAPS
100.0000 mg | ORAL_CAPSULE | Freq: Every day | ORAL | 0 refills | Status: DC
  Filled 2023-01-31: qty 90, 90d supply, fill #0

## 2023-01-31 MED ORDER — CLONIDINE HCL 0.1 MG PO TABS
0.1000 mg | ORAL_TABLET | Freq: Three times a day (TID) | ORAL | 0 refills | Status: DC
  Filled 2023-01-31: qty 270, 90d supply, fill #0

## 2023-01-31 NOTE — Progress Notes (Signed)
Virtual Visit via Video Note  I connected with Amber Craig on 01/31/23 at  8:30 AM EDT by a video enabled telemedicine application and verified that I am speaking with the correct person using two identifiers.  Location: Patient: Home Provider: office   I discussed the limitations of evaluation and management by telemedicine and the availability of in person appointments. The patient expressed understanding and agreed to proceed.  History of Present Illness: Amber Craig reports her sleep has improved and she is getting 6-8 hrs. On weekends she tend to sleep up to 12 hrs. Work is going well and it is good that she is floating. She loves floating. Her focus is really good with Strattera. Amber Craig patience is good and she is able to control her response better. When irritable she is able to let go of it sooner. Amber Craig has a little bit of depression and anxious that are mostly situational. It is manageable and in the last 2 weeks she was depressed for 3 days. She is more tearful but thinks it is a good thing. She denies isolation and anhedonia and hopelessness. She denies SI/HI. Amber Craig does feel that increasing the Remeron back  to 60mg  would be helpful.  She is 64 days sober. Amber Craig has decided to stop Antabuse. She wants to get back to school and get her LPN later this year. Amber Craig denies side effects and feels the meds are effective. Amber Craig is a few days late on her period and is going to take a pregnancy test in 1-2 days.     Observations/Objective: Psychiatric Specialty Exam: ROS  There were no vitals taken for this visit.There is no height or weight on file to calculate BMI.  General Appearance: Casual  Eye Contact:  Good  Speech:  Clear and Coherent and Normal Rate  Volume:  Normal  Mood:  Euthymic  Affect:  Full Range  Thought Process:  Goal Directed, Linear, and Descriptions of Associations: Intact  Orientation:  Full (Time, Place, and Person)  Thought Content:  Logical  Suicidal Thoughts:  No   Homicidal Thoughts:  No  Memory:  Immediate;   Good  Judgement:  Good  Insight:  Good  Psychomotor Activity:  Normal  Concentration:  Concentration: Good  Recall:  Good  Fund of Knowledge:  Good  Language:  Good  Akathisia:  No  Handed:  Right  AIMS (if indicated):     Assets:  Communication Skills Desire for Improvement Financial Resources/Insurance Housing Leisure Time Resilience Social Support Talents/Skills Transportation Vocational/Educational  ADL's:  Intact  Cognition:  WNL  Sleep:        Assessment and Plan:     01/31/2023    8:38 AM 12/13/2022    8:34 AM 11/08/2022    9:40 AM 08/09/2022    9:15 AM 05/17/2022    9:11 AM  Depression screen PHQ 2/9  Decreased Interest 0 0 2 0 0  Down, Depressed, Hopeless 1 1 3 1  0  PHQ - 2 Score 1 1 5 1  0  Altered sleeping 0 1 3  0  Tired, decreased energy 0 1 2  3   Change in appetite 0 2 2  0  Feeling bad or failure about yourself  0 1 2  1   Trouble concentrating 0 1 1  3   Moving slowly or fidgety/restless 0 0 0  0  Suicidal thoughts 0 0 0  0  PHQ-9 Score 1 7 15  7   Difficult doing work/chores Somewhat difficult Not difficult at all  Very difficult  Not difficult at all    Flowsheet Row Video Visit from 01/31/2023 in [facility] Video Visit from 12/13/2022 in [facility] Video Visit from 11/08/2022 in [facility]  C-SSRS RISK CATEGORY No Risk No Risk No Risk          Pt is aware that these meds carry a teratogenic risk. She is a few days late on her period and thinks she might be pregnant. Amber Craig is going to take a pregnancy test in a few days. We have opted to hold all meds until the pregnancy status is confirmed. If positive she will hold off on all her psychiatric meds and talk to her gynecologist. If negative and she is comfortable she will restart all her psychiatric meds.   Status of current problems:  situational depression and anxiety. Sober for 64 days.    Medication management with supportive therapy. Risks and benefits, side effects and alternative treatment options discussed with patient. Pt was given an opportunity to ask questions about medication, illness, and treatment. All current psychiatric medications have been reviewed and discussed with the patient and adjusted as clinically appropriate.  Pt verbalized understanding and verbal consent obtained for treatment.  Meds: may increase Remeron to 60mg  po qHS if not pregnant 1. Insomnia due to other mental disorder - cloNIDine (CATAPRES) 0.1 MG tablet; Take 1 tablet (0.1 mg total) by mouth 3 (three) times daily.  Dispense: 270 tablet; Refill: 0 - mirtazapine (REMERON) 30 MG tablet; Take 2 tablets (60 mg total) by mouth at bedtime.  Dispense: 180 tablet; Refill: 0  2. Severe episode of recurrent major depressive disorder, without psychotic features (HCC) - atomoxetine (STRATTERA) 100 MG capsule; Take 1 capsule (100 mg total) by mouth daily.  Dispense: 90 capsule; Refill: 0 - venlafaxine XR (EFFEXOR-XR) 150 MG 24 hr capsule; Take 1 capsule (150 mg total) by mouth daily.  Dispense: 90 capsule; Refill: 0 - mirtazapine (REMERON) 30 MG tablet; Take 2 tablets (60 mg total) by mouth at bedtime.  Dispense: 180 tablet; Refill: 0  3. GAD (generalized anxiety disorder) - cloNIDine (CATAPRES) 0.1 MG tablet; Take 1 tablet (0.1 mg total) by mouth 3 (three) times daily.  Dispense: 270 tablet; Refill: 0 - venlafaxine XR (EFFEXOR-XR) 150 MG 24 hr capsule; Take 1 capsule (150 mg total) by mouth daily.  Dispense: 90 capsule; Refill: 0 - mirtazapine (REMERON) 30 MG tablet; Take 2 tablets (60 mg total) by mouth at bedtime.  Dispense: 180 tablet; Refill: 0  4. Alcohol use disorder, moderate, dependence (HCC)  5. PTSD (post-traumatic stress disorder) - atomoxetine (STRATTERA) 100 MG capsule; Take 1 capsule (100 mg total) by mouth daily.  Dispense: 90 capsule;  Refill: 0 - cloNIDine (CATAPRES) 0.1 MG tablet; Take 1 tablet (0.1 mg total) by mouth 3 (three) times daily.  Dispense: 270 tablet; Refill: 0 - venlafaxine XR (EFFEXOR-XR) 150 MG 24 hr capsule; Take 1 capsule (150 mg total) by mouth daily.  Dispense: 90 capsule; Refill: 0  6. Alcohol use disorder, severe, dependence (HCC) - naltrexone (DEPADE) 50 MG tablet; Take 1 tablet (50 mg total) by mouth daily.  Dispense: 90 tablet; Refill: 0     Labs: none today. She is getting a home pregnancy test and will set up appointment with her gynocologist soon.     Therapy: brief supportive therapy provided. Discussed psychosocial stressors in detail.   We reviewed ways to continue sobriety and she has a crisis plan  for support if needed Collaboration of Care: Other none today  Patient/Guardian was advised Release of Information must be obtained prior to any record release in order to collaborate their care with an outside provider. Patient/Guardian was advised if they have not already done so to contact the registration department to sign all necessary forms in order for [place] to release information regarding their care.   Consent: Patient/Guardian gives verbal consent for treatment and assignment of benefits for services provided during this visit. Patient/Guardian expressed understanding and agreed to proceed.      Follow Up Instructions: Follow up in 1 months or sooner if needed with a new psychiatrist  Patient informed that I am leaving [facility] in 02/2023 and I relayed that they will be getting a new provider after that. Patient verbalized understanding and agreed with the plan.     I discussed the assessment and treatment plan with the patient. The patient was provided an opportunity to ask questions and all were answered. The patient agreed with the plan and demonstrated an understanding of the instructions.   The patient was advised to call back or seek an in-person evaluation if the symptoms worsen  or if the condition fails to improve as anticipated.  I provided 18 minutes of non-face-to-face time during this encounter.   Oletta Darter, MD

## 2023-02-07 ENCOUNTER — Telehealth (HOSPITAL_COMMUNITY): Payer: Self-pay

## 2023-02-07 NOTE — Telephone Encounter (Signed)
Patient is calling to advise she has had a positive pregnancy test. Patient wants to know if there are any meds she should stop or change. Please review and advise, thank you

## 2023-02-11 ENCOUNTER — Telehealth (HOSPITAL_COMMUNITY): Payer: Self-pay

## 2023-02-11 NOTE — Telephone Encounter (Signed)
I called and left patient a voicemail about stopping certain medications because she is pregnant. Patient called back and is asking about the Clonidine - she wants to know if she should stop that. Patient is going to follow up with her OB as well.

## 2023-02-15 DIAGNOSIS — Z3201 Encounter for pregnancy test, result positive: Secondary | ICD-10-CM | POA: Diagnosis not present

## 2023-03-01 ENCOUNTER — Inpatient Hospital Stay (HOSPITAL_COMMUNITY)
Admission: AD | Admit: 2023-03-01 | Discharge: 2023-03-02 | Disposition: A | Discharge status: Home / Self Care | Payer: 59 | Attending: Obstetrics and Gynecology | Admitting: Obstetrics and Gynecology

## 2023-03-01 DIAGNOSIS — Z3A01 Less than 8 weeks gestation of pregnancy: Secondary | ICD-10-CM | POA: Diagnosis not present

## 2023-03-01 DIAGNOSIS — O021 Missed abortion: Secondary | ICD-10-CM | POA: Diagnosis not present

## 2023-03-01 DIAGNOSIS — O209 Hemorrhage in early pregnancy, unspecified: Secondary | ICD-10-CM | POA: Diagnosis not present

## 2023-03-01 DIAGNOSIS — F17210 Nicotine dependence, cigarettes, uncomplicated: Secondary | ICD-10-CM | POA: Insufficient documentation

## 2023-03-01 DIAGNOSIS — D259 Leiomyoma of uterus, unspecified: Secondary | ICD-10-CM | POA: Diagnosis not present

## 2023-03-01 LAB — POCT PREGNANCY, URINE: Preg Test, Ur: POSITIVE — AB

## 2023-03-01 NOTE — MAU Note (Signed)
..  Amber Craig is a 42 y.o. at Unknown here in MAU reporting: Reports vaginal bleeding that has gotten heavier this past week. Had visit today and labs drawn, called doctor Shivaji and was told to come in. Reports some cramping that began a couple weeks ago.  LMP: 12/31/2022 Pain score: 2/10 Vitals:   03/01/23 2306  BP: 123/73  Pulse: 78  Resp: 17  Temp: 98.1 F (36.7 C)  SpO2: 100%     FHT: N/A Lab orders placed from triage:  Pregnancy test

## 2023-03-02 ENCOUNTER — Inpatient Hospital Stay (HOSPITAL_COMMUNITY): Payer: 59

## 2023-03-02 ENCOUNTER — Other Ambulatory Visit (HOSPITAL_COMMUNITY): Payer: Self-pay

## 2023-03-02 DIAGNOSIS — Z3A01 Less than 8 weeks gestation of pregnancy: Secondary | ICD-10-CM

## 2023-03-02 DIAGNOSIS — O021 Missed abortion: Secondary | ICD-10-CM | POA: Diagnosis not present

## 2023-03-02 DIAGNOSIS — D259 Leiomyoma of uterus, unspecified: Secondary | ICD-10-CM | POA: Diagnosis not present

## 2023-03-02 DIAGNOSIS — O209 Hemorrhage in early pregnancy, unspecified: Secondary | ICD-10-CM | POA: Diagnosis not present

## 2023-03-02 LAB — CBC
HCT: 41.2 % (ref 36.0–46.0)
Hemoglobin: 13.2 g/dL (ref 12.0–15.0)
MCH: 28.3 pg (ref 26.0–34.0)
MCHC: 32 g/dL (ref 30.0–36.0)
MCV: 88.2 fL (ref 80.0–100.0)
Platelets: 338 10*3/uL (ref 150–400)
RBC: 4.67 MIL/uL (ref 3.87–5.11)
RDW: 13.9 % (ref 11.5–15.5)
WBC: 5.8 10*3/uL (ref 4.0–10.5)
nRBC: 0 % (ref 0.0–0.2)

## 2023-03-02 LAB — URINALYSIS, ROUTINE W REFLEX MICROSCOPIC
Bacteria, UA: NONE SEEN
Bilirubin Urine: NEGATIVE
Glucose, UA: NEGATIVE mg/dL
Ketones, ur: 5 mg/dL — AB
Leukocytes,Ua: NEGATIVE
Nitrite: NEGATIVE
Protein, ur: NEGATIVE mg/dL
Specific Gravity, Urine: 1.032 — ABNORMAL HIGH (ref 1.005–1.030)
pH: 5 (ref 5.0–8.0)

## 2023-03-02 LAB — WET PREP, GENITAL
Sperm: NONE SEEN
Trich, Wet Prep: NONE SEEN
WBC, Wet Prep HPF POC: >=10 — AB (ref <10)
Yeast Wet Prep HPF POC: NONE SEEN

## 2023-03-02 LAB — HCG, QUANTITATIVE, PREGNANCY: hCG, Beta Chain, Quant, S: 19676 m[IU]/mL — ABNORMAL HIGH (ref <5)

## 2023-03-02 MED ORDER — MISOPROSTOL 200 MCG PO TABS
ORAL_TABLET | ORAL | 0 refills | Status: AC
  Filled 2023-03-02: qty 3, 1d supply, fill #0

## 2023-03-02 MED ORDER — PROMETHAZINE HCL 12.5 MG PO TABS
12.5000 mg | ORAL_TABLET | Freq: Four times a day (QID) | ORAL | 0 refills | Status: AC | PRN
  Filled 2023-03-02: qty 30, 8d supply, fill #0

## 2023-03-02 MED ORDER — TRAMADOL HCL 50 MG PO TABS
50.0000 mg | ORAL_TABLET | Freq: Four times a day (QID) | ORAL | 0 refills | Status: DC | PRN
  Filled 2023-03-02: qty 10, 3d supply, fill #0
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
## 2023-03-04 LAB — GC/CHLAMYDIA PROBE AMP (~~LOC~~) NOT AT ARMC
Chlamydia: NEGATIVE
Comment: NEGATIVE
Comment: NORMAL
Neisseria Gonorrhea: NEGATIVE

## 2023-03-07 ENCOUNTER — Other Ambulatory Visit (HOSPITAL_COMMUNITY): Payer: Self-pay

## 2023-03-07 MED ORDER — TRAMADOL HCL 50 MG PO TABS
50.0000 mg | ORAL_TABLET | Freq: Four times a day (QID) | ORAL | 0 refills | Status: DC | PRN
  Filled 2023-03-07: qty 12, 3d supply, fill #0

## 2023-03-12 DIAGNOSIS — O021 Missed abortion: Secondary | ICD-10-CM | POA: Diagnosis not present

## 2023-03-29 DIAGNOSIS — Z113 Encounter for screening for infections with a predominantly sexual mode of transmission: Secondary | ICD-10-CM | POA: Diagnosis not present

## 2023-03-29 DIAGNOSIS — O021 Missed abortion: Secondary | ICD-10-CM | POA: Diagnosis not present

## 2023-04-02 ENCOUNTER — Other Ambulatory Visit: Payer: Self-pay

## 2023-04-02 ENCOUNTER — Emergency Department (HOSPITAL_BASED_OUTPATIENT_CLINIC_OR_DEPARTMENT_OTHER): Payer: 59

## 2023-04-02 ENCOUNTER — Other Ambulatory Visit (HOSPITAL_COMMUNITY): Payer: Self-pay

## 2023-04-02 ENCOUNTER — Emergency Department (HOSPITAL_BASED_OUTPATIENT_CLINIC_OR_DEPARTMENT_OTHER)
Admission: EM | Admit: 2023-04-02 | Discharge: 2023-04-02 | Disposition: A | Discharge status: Home / Self Care | Payer: 59 | Attending: Emergency Medicine | Admitting: Emergency Medicine

## 2023-04-02 ENCOUNTER — Encounter (HOSPITAL_BASED_OUTPATIENT_CLINIC_OR_DEPARTMENT_OTHER): Payer: Self-pay | Admitting: Emergency Medicine

## 2023-04-02 DIAGNOSIS — E876 Hypokalemia: Secondary | ICD-10-CM | POA: Insufficient documentation

## 2023-04-02 DIAGNOSIS — R0789 Other chest pain: Secondary | ICD-10-CM | POA: Diagnosis not present

## 2023-04-02 DIAGNOSIS — M25512 Pain in left shoulder: Secondary | ICD-10-CM | POA: Diagnosis not present

## 2023-04-02 DIAGNOSIS — M25521 Pain in right elbow: Secondary | ICD-10-CM | POA: Insufficient documentation

## 2023-04-02 LAB — COMPREHENSIVE METABOLIC PANEL
ALT: 12 U/L (ref 0–44)
AST: 15 U/L (ref 15–41)
Albumin: 3.4 g/dL — ABNORMAL LOW (ref 3.5–5.0)
Alkaline Phosphatase: 46 U/L (ref 38–126)
Anion gap: 8 (ref 5–15)
BUN: 10 mg/dL (ref 6–20)
CO2: 23 mmol/L (ref 22–32)
Calcium: 8.4 mg/dL — ABNORMAL LOW (ref 8.9–10.3)
Chloride: 102 mmol/L (ref 98–111)
Creatinine, Ser: 0.76 mg/dL (ref 0.44–1.00)
GFR, Estimated: >60 mL/min (ref >60)
Glucose, Bld: 114 mg/dL — ABNORMAL HIGH (ref 70–99)
Potassium: 3.1 mmol/L — ABNORMAL LOW (ref 3.5–5.1)
Sodium: 133 mmol/L — ABNORMAL LOW (ref 135–145)
Total Bilirubin: 0.4 mg/dL (ref 0.3–1.2)
Total Protein: 6.6 g/dL (ref 6.5–8.1)

## 2023-04-02 LAB — CBC WITH DIFFERENTIAL/PLATELET
Abs Immature Granulocytes: 0.03 10*3/uL (ref 0.00–0.07)
Basophils Absolute: 0 10*3/uL (ref 0.0–0.1)
Basophils Relative: 0 %
Eosinophils Absolute: 0.1 10*3/uL (ref 0.0–0.5)
Eosinophils Relative: 2 %
HCT: 37 % (ref 36.0–46.0)
Hemoglobin: 12.1 g/dL (ref 12.0–15.0)
Immature Granulocytes: 0 %
Lymphocytes Relative: 29 %
Lymphs Abs: 2 10*3/uL (ref 0.7–4.0)
MCH: 28.1 pg (ref 26.0–34.0)
MCHC: 32.7 g/dL (ref 30.0–36.0)
MCV: 85.8 fL (ref 80.0–100.0)
Monocytes Absolute: 0.7 10*3/uL (ref 0.1–1.0)
Monocytes Relative: 10 %
Neutro Abs: 4.2 10*3/uL (ref 1.7–7.7)
Neutrophils Relative %: 59 %
Platelets: 284 10*3/uL (ref 150–400)
RBC: 4.31 MIL/uL (ref 3.87–5.11)
RDW: 13.2 % (ref 11.5–15.5)
WBC: 7.1 10*3/uL (ref 4.0–10.5)
nRBC: 0 % (ref 0.0–0.2)

## 2023-04-02 LAB — TROPONIN I (HIGH SENSITIVITY): Troponin I (High Sensitivity): <2 ng/L (ref <18)

## 2023-04-02 MED ORDER — LIDOCAINE 4 % EX PTCH
1.0000 | MEDICATED_PATCH | CUTANEOUS | 0 refills | Status: AC
  Filled 2023-04-02: qty 30, 30d supply, fill #0

## 2023-04-02 MED ORDER — CYCLOBENZAPRINE HCL 5 MG PO TABS
5.0000 mg | ORAL_TABLET | Freq: Three times a day (TID) | ORAL | 0 refills | Status: AC | PRN
  Filled 2023-04-02: qty 30, 10d supply, fill #0

## 2023-04-02 MED ORDER — CYCLOBENZAPRINE HCL 5 MG PO TABS
5.0000 mg | ORAL_TABLET | Freq: Once | ORAL | Status: AC
  Administered 2023-04-02: 5 mg via ORAL
  Filled 2023-04-02: qty 1

## 2023-04-02 MED ORDER — POTASSIUM CHLORIDE 20 MEQ PO PACK
40.0000 meq | PACK | Freq: Once | ORAL | Status: AC
  Administered 2023-04-02: 40 meq via ORAL
  Filled 2023-04-02: qty 2

## 2023-04-02 MED ORDER — KETOROLAC TROMETHAMINE 15 MG/ML IJ SOLN
15.0000 mg | Freq: Once | INTRAMUSCULAR | Status: AC
  Administered 2023-04-02: 15 mg via INTRAVENOUS
  Filled 2023-04-02: qty 1

## 2023-04-02 NOTE — ED Provider Notes (Signed)
[facility] Provider Note   CSN: [id number] Arrival date & time: 04/02/23  1313     History  Chief Complaint  Patient presents with   Shoulder Pain    Amber Craig is a 42 y.o. female, no pertinent past medical history, who presents to the ED secondary to left shoulder pain has been going on for the last 3 days.  She denies any trauma, recent falls, injuries, heavy lifting.  States that she started having left shoulder pain, about 3 days ago, all of a sudden, feels achy, and difficult to lift her left arm.  She denies any kind of neck pain, but states it started around humeral head, radiates to her left shoulder.  Denies any chest pain, shortness of breath, vomiting.  Does endorse some nausea when the pain is so severe.  She notes that moving it makes it worse, denies any alleviating or aggravating factors.     Home Medications Prior to Admission medications   Medication Sig Start Date End Date Taking? Authorizing Provider  cyclobenzaprine (FLEXERIL) 5 MG tablet Take 1 tablet (5 mg total) by mouth 3 (three) times daily as needed. 04/02/23  Yes Avanna Sowder L, PA  lidocaine (HM LIDOCAINE PATCH) 4 % Place 1 patch onto the skin daily. 04/02/23  Yes Paisli Silfies L, PA  atomoxetine (STRATTERA) 100 MG capsule Take 1 capsule (100 mg total) by mouth daily. 01/31/23   Oletta Darter, MD  azelastine (OPTIVAR) 0.05 % ophthalmic solution Apply 1 drop to eye 2 (two) times daily. 11/14/20   [provider]  cloNIDine (CATAPRES) 0.1 MG tablet Take 1 tablet (0.1 mg total) by mouth 3 (three) times daily. 01/31/23   Oletta Darter, MD  cycloSPORINE (RESTASIS) 0.05 % ophthalmic emulsion  02/15/20   [provider]  fluticasone (FLONASE) 50 MCG/ACT nasal spray     [provider]  indomethacin (INDOCIN) 25 MG capsule Take 25 mg by mouth daily as needed.    [provider]  ipratropium (ATROVENT) 0.03 % nasal spray  10/18/20    [provider]  ipratropium (ATROVENT) 0.03 % nasal spray USE 2 SPRAYS IN EACH NOSTRIL TWO TO THREE TIMES PER DAY 10/18/20 10/18/21  Aquilla Hacker, PA-C  ipratropium (ATROVENT) 0.03 % nasal spray Instill 2 puffs in each nostril 2 - 3 times per day. 02/26/22     levocetirizine (XYZAL) 5 MG tablet  11/14/20   [provider]  levocetirizine (XYZAL) 5 MG tablet TAKE 1 TABLET BY MOUTH ONCE DAILY IN THE EVENING 11/14/20 11/14/21  Sidney Ace, MD  levocetirizine (XYZAL) 5 MG tablet Take 1 tablet by mouth twice daily as directed. 04/19/22   Oletta Darter, MD  mirtazapine (REMERON) 30 MG tablet Take 2 tablets (60 mg total) by mouth at bedtime. 01/31/23   Oletta Darter, MD  misoprostol (CYTOTEC) 200 MCG tablet Place all 3 tablets (600 mcg) in your cheek at one time and let them soften before swallowing. 03/02/23   Leftwich-Kirby, Wilmer Floor, CNM  naltrexone (DEPADE) 50 MG tablet Take 1 tablet (50 mg total) by mouth daily. 01/31/23   Oletta Darter, MD  promethazine (PHENERGAN) 12.5 MG tablet Take 1 tablet (12.5 mg total) by mouth every 6 (six) hours as needed for nausea or vomiting. 03/02/23   Leftwich-Kirby, Wilmer Floor, CNM  traMADol (ULTRAM) 50 MG tablet Take 1 tablet (50 mg total) by mouth every 6 (six) hours as needed. 03/02/23   Leftwich-Kirby, Wilmer Floor, CNM  traMADol (  ULTRAM) 50 MG tablet Take 1 tablet (50 mg total) by mouth every 6 (six) hours as needed for 3 days 03/06/23     venlafaxine XR (EFFEXOR-XR) 150 MG 24 hr capsule Take 1 capsule (150 mg total) by mouth daily. 01/31/23   Oletta Darter, MD      Allergies    Patient has no known allergies.    Review of Systems   Review of Systems  Musculoskeletal:  Negative for neck pain.       +L shoulder pain    Physical Exam Updated Vital Signs BP 129/80 (BP Location: Right Arm)   Pulse 94   Temp 99.1 F (37.3 C)   Resp 20   Ht 5\' 4"  (1.626 m)   Wt 75.8 kg   LMP 03/26/2023   SpO2 99%   BMI 28.67 kg/m  Physical Exam Vitals and nursing  note reviewed.  Constitutional:      General: She is not in acute distress.    Appearance: She is well-developed.  HENT:     Head: Normocephalic and atraumatic.  Eyes:     Conjunctiva/sclera: Conjunctivae normal.  Cardiovascular:     Rate and Rhythm: Normal rate and regular rhythm.     Heart sounds: No murmur heard. Pulmonary:     Effort: Pulmonary effort is normal. No respiratory distress.     Breath sounds: Normal breath sounds.  Abdominal:     Palpations: Abdomen is soft.     Tenderness: There is no abdominal tenderness.  Musculoskeletal:        General: No swelling.     Cervical back: Neck supple.     Comments: Palpation supraspinatus, as well as humeral head.  Mild tenderness palpation of right elbow, no radial, ulnar pain.  Range of motion is restricted, with around 60 degree abduction.  Internal and external rotation makes the pain much worse.  Adduction intact.  No cervical or thoracic thoracic midline tenderness.  No ecchymosis or rashes.  No crepitus.  Reduced strength of left upper extremity.  Skin:    General: Skin is warm and dry.     Capillary Refill: Capillary refill takes less than 2 seconds.  Neurological:     Mental Status: She is alert.  Psychiatric:        Mood and Affect: Mood normal.     ED Results / Procedures / Treatments   Labs (all labs ordered are listed, but only abnormal results are displayed) Labs Reviewed  COMPREHENSIVE METABOLIC PANEL - Abnormal; Notable for the following components:      Result Value   Sodium 133 (*)    Potassium 3.1 (*)    Glucose, Bld 114 (*)    Calcium 8.4 (*)    Albumin 3.4 (*)    All other components within normal limits  CBC WITH DIFFERENTIAL/PLATELET  TROPONIN I (HIGH SENSITIVITY)  TROPONIN I (HIGH SENSITIVITY)    EKG EKG Interpretation Date/Time:  Tuesday April 02 2023 13:22:00 EDT Ventricular Rate:  92 PR Interval:  127 QRS Duration:  78 QT Interval:  347 QTC Calculation: 430 R Axis:   75  Text  Interpretation: Sinus rhythm Biatrial enlargement No significant change since prior 3/17 Confirmed by Meridee Score [id number]) on 04/02/2023 1:23:47 PM  Radiology DG Elbow Complete Left  Result Date: 04/02/2023 CLINICAL DATA:  L shoulder pain EXAM: LEFT ELBOW - COMPLETE 3+ VIEW COMPARISON:  None Available. FINDINGS: No acute fracture or dislocation. No aggressive osseous lesion. No significant arthritis. No radiopaque foreign bodies. Soft  tissues are within normal limits. IMPRESSION: Negative. Electronically Signed   By: Jules Schick M.D.   On: 04/02/2023 14:24   DG Chest 2 View  Result Date: 04/02/2023 CLINICAL DATA:  027253 Shoulder pain, left [id number] EXAM: CHEST - 2 VIEW COMPARISON:  None Available. FINDINGS: Bilateral lung fields are clear. Bilateral costophrenic angles are clear. Normal cardio-mediastinal silhouette. No acute osseous abnormalities. The soft tissues are within normal limits. IMPRESSION: No active cardiopulmonary disease. Electronically Signed   By: Jules Schick M.D.   On: 04/02/2023 14:23   DG Shoulder Left  Result Date: 04/02/2023 CLINICAL DATA:  pain EXAM: LEFT SHOULDER - 2+ VIEW COMPARISON:  None Available. FINDINGS: No acute fracture or dislocation. No aggressive osseous lesion. Glenohumeral and acromioclavicular joints are normal in alignment and without significant arthritis. No soft tissue swelling. No radiopaque foreign bodies. IMPRESSION: No acute fracture or dislocation. Electronically Signed   By: Jules Schick M.D.   On: 04/02/2023 14:21    Procedures Procedures    Medications Ordered in ED Medications  potassium chloride (KLOR-CON) packet 40 mEq (40 mEq Oral Given 04/02/23 1458)  ketorolac (TORADOL) 15 MG/ML injection 15 mg (15 mg Intravenous Given 04/02/23 1458)  cyclobenzaprine (FLEXERIL) tablet 5 mg (5 mg Oral Given 04/02/23 1458)    ED Course/ Medical Decision Making/ A&P                                 Medical Decision Making Patient is a 42 year old  female, here for left arm pain, it has been going on for the last 3 days.  Worse with range of motion.  Denies any trauma.  She has tenderness to palpation of the humeral head, as well as the posterior shoulder.  Pain radiates to the elbow.  It is sharp and stabbing, and dull aching achy at times.  We obtain troponin, chest x-ray, as well as elbow, and shoulder x-ray for further evaluation.  She has reduced range of motion of her abduction, and pain with external and internal rotation.  She is PERC negative.  Denies any trauma  Amount and/or Complexity of Data Reviewed Labs: ordered.    Details: Troponin within normal limits, blood work unremarkable except for mild hypokalemia 3.1 Radiology: ordered.    Details: X-rays, unremarkable no evidence of any kind of fracture, bony findings ECG/medicine tests:  Decision-making details documented in ED Course. Discussion of management or test interpretation with external provider(s): Discussed with patient, heart score 1.  Overall reassuring exam.  I believe this is likely musculoskeletal, given the tenderness to palpation, and the weakness of the shoulder.  It may be a supraspinatus tear, she was placed in a sling and encouraged to follow-up with orthopedics and primary care doctor.  We discussed return precautions and she voiced understanding.  Risk OTC drugs. Prescription drug management.    Final Clinical Impression(s) / ED Diagnoses Final diagnoses:  Acute pain of left shoulder    Rx / DC Orders ED Discharge Orders          Ordered    cyclobenzaprine (FLEXERIL) 5 MG tablet  3 times daily PRN        04/02/23 1553    lidocaine (HM LIDOCAINE PATCH) 4 %  Every 24 hours        04/02/23 1553              Woodson Macha, [place], [place] 04/02/23 1602    Meridee Score  C, MD 04/02/23 1731

## 2023-04-02 NOTE — Discharge Instructions (Addendum)
Your lab work today was reassuring, your x-rays were negative as well.  I believe that you likely have a strain,/sprain of your shoulder.  Please take the muscle relaxer as needed, as well as Tylenol and ibuprofen for your pain.  Return to the ER if you feel like your symptoms are worsening

## 2023-04-02 NOTE — ED Triage Notes (Signed)
Pt POV c/o worsening L shoulder pain x3 days. Denies known injury.  Reports tingling in left fingertips.

## 2023-04-03 ENCOUNTER — Other Ambulatory Visit (HOSPITAL_COMMUNITY): Payer: Self-pay

## 2023-07-15 ENCOUNTER — Ambulatory Visit (HOSPITAL_BASED_OUTPATIENT_CLINIC_OR_DEPARTMENT_OTHER): Payer: 59 | Admitting: Family

## 2023-07-15 ENCOUNTER — Encounter (HOSPITAL_COMMUNITY): Payer: Self-pay | Admitting: Family

## 2023-07-15 ENCOUNTER — Other Ambulatory Visit (HOSPITAL_COMMUNITY): Payer: Self-pay

## 2023-07-15 ENCOUNTER — Other Ambulatory Visit: Payer: Self-pay

## 2023-07-15 DIAGNOSIS — F332 Major depressive disorder, recurrent severe without psychotic features: Secondary | ICD-10-CM | POA: Diagnosis not present

## 2023-07-15 DIAGNOSIS — F99 Mental disorder, not otherwise specified: Secondary | ICD-10-CM | POA: Diagnosis not present

## 2023-07-15 DIAGNOSIS — F5105 Insomnia due to other mental disorder: Secondary | ICD-10-CM | POA: Diagnosis not present

## 2023-07-15 DIAGNOSIS — F4310 Post-traumatic stress disorder, unspecified: Secondary | ICD-10-CM

## 2023-07-15 DIAGNOSIS — F411 Generalized anxiety disorder: Secondary | ICD-10-CM | POA: Diagnosis not present

## 2023-07-15 MED ORDER — VENLAFAXINE HCL ER 150 MG PO CP24
150.0000 mg | ORAL_CAPSULE | Freq: Every day | ORAL | 0 refills | Status: DC
  Filled 2023-07-15: qty 90, 90d supply, fill #0

## 2023-07-15 MED ORDER — ATOMOXETINE HCL 100 MG PO CAPS
100.0000 mg | ORAL_CAPSULE | Freq: Every day | ORAL | 0 refills | Status: DC
Start: 2023-07-15 — End: 2024-05-26
  Filled 2023-07-15: qty 90, 90d supply, fill #0

## 2023-07-15 MED ORDER — CLONIDINE HCL 0.1 MG PO TABS
0.1000 mg | ORAL_TABLET | Freq: Three times a day (TID) | ORAL | 0 refills | Status: AC
  Filled 2023-07-15: qty 240, 80d supply, fill #0

## 2023-07-15 MED ORDER — MIRTAZAPINE 30 MG PO TABS
60.0000 mg | ORAL_TABLET | Freq: Every day | ORAL | 0 refills | Status: DC
Start: 2023-07-15 — End: 2024-05-26
  Filled 2023-07-15: qty 180, 90d supply, fill #0
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
## 2023-07-16 ENCOUNTER — Other Ambulatory Visit: Payer: Self-pay

## 2023-07-16 ENCOUNTER — Encounter: Payer: Self-pay | Admitting: Pharmacist

## 2023-07-17 ENCOUNTER — Other Ambulatory Visit (HOSPITAL_COMMUNITY): Payer: Self-pay

## 2023-07-17 ENCOUNTER — Other Ambulatory Visit: Payer: Self-pay

## 2023-10-15 ENCOUNTER — Ambulatory Visit (HOSPITAL_COMMUNITY): Payer: 59 | Admitting: Family

## 2024-05-26 ENCOUNTER — Other Ambulatory Visit (HOSPITAL_COMMUNITY): Payer: Self-pay

## 2024-05-26 ENCOUNTER — Other Ambulatory Visit: Payer: Self-pay

## 2024-05-26 ENCOUNTER — Ambulatory Visit (HOSPITAL_BASED_OUTPATIENT_CLINIC_OR_DEPARTMENT_OTHER): Admitting: Family

## 2024-05-26 ENCOUNTER — Encounter (HOSPITAL_COMMUNITY): Payer: Self-pay | Admitting: Family

## 2024-05-26 DIAGNOSIS — F4310 Post-traumatic stress disorder, unspecified: Secondary | ICD-10-CM | POA: Diagnosis not present

## 2024-05-26 DIAGNOSIS — F332 Major depressive disorder, recurrent severe without psychotic features: Secondary | ICD-10-CM

## 2024-05-26 MED ORDER — MIRTAZAPINE 7.5 MG PO TABS
7.5000 mg | ORAL_TABLET | Freq: Every day | ORAL | 1 refills | Status: AC
  Filled 2024-05-26: qty 30, 30d supply, fill #0
  Filled 2024-06-29: qty 30, 30d supply, fill #1

## 2024-05-26 MED ORDER — ATOMOXETINE HCL 25 MG PO CAPS
25.0000 mg | ORAL_CAPSULE | Freq: Every day | ORAL | 1 refills | Status: AC
Start: 2024-05-26 — End: ?
  Filled 2024-05-26 – 2024-06-16 (×2): qty 30, 30d supply, fill #0
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
## 2024-05-27 ENCOUNTER — Other Ambulatory Visit: Payer: Self-pay

## 2024-05-27 ENCOUNTER — Other Ambulatory Visit (HOSPITAL_COMMUNITY): Payer: Self-pay

## 2024-06-16 ENCOUNTER — Other Ambulatory Visit: Payer: Self-pay

## 2024-06-29 ENCOUNTER — Other Ambulatory Visit (HOSPITAL_COMMUNITY): Payer: Self-pay

## 2024-07-15 ENCOUNTER — Telehealth (HOSPITAL_COMMUNITY): Admitting: Family

## 2024-07-28 ENCOUNTER — Telehealth (HOSPITAL_COMMUNITY): Admitting: Family

## 2024-07-28 ENCOUNTER — Encounter (HOSPITAL_COMMUNITY): Payer: Self-pay
